# Patient Record
Sex: Female | Born: 1935 | Race: White | Hispanic: No | Smoking: Never smoker
Health system: Southern US, Community
[De-identification: ages and names within clinical notes are randomized; demographics above are authoritative.]

## PROBLEM LIST (undated history)

## (undated) DIAGNOSIS — I639 Cerebral infarction, unspecified: Secondary | ICD-10-CM

## (undated) DIAGNOSIS — R413 Other amnesia: Secondary | ICD-10-CM

## (undated) DIAGNOSIS — M797 Fibromyalgia: Secondary | ICD-10-CM

## (undated) DIAGNOSIS — R9389 Abnormal findings on diagnostic imaging of other specified body structures: Secondary | ICD-10-CM

## (undated) DIAGNOSIS — I10 Essential (primary) hypertension: Secondary | ICD-10-CM

## (undated) DIAGNOSIS — I340 Nonrheumatic mitral (valve) insufficiency: Secondary | ICD-10-CM

## (undated) DIAGNOSIS — I509 Heart failure, unspecified: Secondary | ICD-10-CM

## (undated) DIAGNOSIS — K219 Gastro-esophageal reflux disease without esophagitis: Secondary | ICD-10-CM

## (undated) DIAGNOSIS — I4891 Unspecified atrial fibrillation: Secondary | ICD-10-CM

## (undated) DIAGNOSIS — K5790 Diverticulosis of intestine, part unspecified, without perforation or abscess without bleeding: Secondary | ICD-10-CM

## (undated) DIAGNOSIS — K635 Polyp of colon: Secondary | ICD-10-CM

## (undated) DIAGNOSIS — K222 Esophageal obstruction: Secondary | ICD-10-CM

## (undated) DIAGNOSIS — G2581 Restless legs syndrome: Secondary | ICD-10-CM

## (undated) DIAGNOSIS — C439 Malignant melanoma of skin, unspecified: Secondary | ICD-10-CM

## (undated) DIAGNOSIS — K2210 Ulcer of esophagus without bleeding: Secondary | ICD-10-CM

## (undated) DIAGNOSIS — E785 Hyperlipidemia, unspecified: Secondary | ICD-10-CM

## (undated) HISTORY — DX: Restless legs syndrome: G25.81

## (undated) HISTORY — DX: Nonrheumatic mitral (valve) insufficiency: I34.0

## (undated) HISTORY — DX: Diverticulosis of intestine, part unspecified, without perforation or abscess without bleeding: K57.90

## (undated) HISTORY — DX: Esophageal obstruction: K22.2

## (undated) HISTORY — DX: Hyperlipidemia, unspecified: E78.5

## (undated) HISTORY — DX: Fibromyalgia: M79.7

## (undated) HISTORY — DX: Cerebral infarction, unspecified: I63.9

## (undated) HISTORY — DX: Malignant melanoma of skin, unspecified: C43.9

## (undated) HISTORY — DX: Other amnesia: R41.3

## (undated) HISTORY — DX: Abnormal findings on diagnostic imaging of other specified body structures: R93.89

## (undated) HISTORY — DX: Gastro-esophageal reflux disease without esophagitis: K21.9

## (undated) HISTORY — DX: Polyp of colon: K63.5

## (undated) HISTORY — PX: MOHS SURGERY: SUR867

## (undated) HISTORY — PX: APPENDECTOMY: SHX54

## (undated) HISTORY — DX: Unspecified atrial fibrillation: I48.91

## (undated) HISTORY — PX: CATARACT EXTRACTION: SUR2

## (undated) HISTORY — DX: Ulcer of esophagus without bleeding: K22.10

## (undated) HISTORY — DX: Heart failure, unspecified: I50.9

## (undated) HISTORY — DX: Essential (primary) hypertension: I10

---

## 1976-05-29 HISTORY — PX: RADICAL HYSTERECTOMY: SHX2283

## 1989-05-29 HISTORY — PX: CHOLECYSTECTOMY: SHX55

## 1998-09-06 ENCOUNTER — Encounter: Payer: Self-pay | Admitting: Gastroenterology

## 1998-09-06 ENCOUNTER — Ambulatory Visit (HOSPITAL_COMMUNITY): Admission: RE | Admit: 1998-09-06 | Discharge: 1998-09-06 | Payer: Self-pay | Admitting: Gastroenterology

## 1998-09-29 ENCOUNTER — Encounter: Payer: Self-pay | Admitting: Gastroenterology

## 1998-09-29 ENCOUNTER — Other Ambulatory Visit: Admission: RE | Admit: 1998-09-29 | Discharge: 1998-09-29 | Payer: Self-pay | Admitting: Gastroenterology

## 2001-09-26 ENCOUNTER — Encounter: Payer: Self-pay | Admitting: Gastroenterology

## 2002-04-10 ENCOUNTER — Encounter: Payer: Self-pay | Admitting: Gastroenterology

## 2002-08-27 ENCOUNTER — Encounter: Payer: Self-pay | Admitting: Gastroenterology

## 2002-08-28 ENCOUNTER — Encounter: Payer: Self-pay | Admitting: Gastroenterology

## 2002-08-28 ENCOUNTER — Ambulatory Visit (HOSPITAL_COMMUNITY): Admission: RE | Admit: 2002-08-28 | Discharge: 2002-08-28 | Payer: Self-pay | Admitting: Gastroenterology

## 2002-09-19 ENCOUNTER — Encounter: Admission: RE | Admit: 2002-09-19 | Discharge: 2002-09-19 | Payer: Self-pay | Admitting: Internal Medicine

## 2002-09-19 ENCOUNTER — Encounter: Payer: Self-pay | Admitting: Internal Medicine

## 2003-03-10 ENCOUNTER — Encounter: Payer: Self-pay | Admitting: Gastroenterology

## 2003-03-17 ENCOUNTER — Encounter: Payer: Self-pay | Admitting: Family Medicine

## 2003-03-17 ENCOUNTER — Inpatient Hospital Stay (HOSPITAL_COMMUNITY): Admission: EM | Admit: 2003-03-17 | Discharge: 2003-03-20 | Payer: Self-pay

## 2003-03-27 ENCOUNTER — Encounter: Payer: Self-pay | Admitting: Internal Medicine

## 2003-03-27 ENCOUNTER — Ambulatory Visit (HOSPITAL_COMMUNITY): Admission: RE | Admit: 2003-03-27 | Discharge: 2003-03-27 | Payer: Self-pay | Admitting: Internal Medicine

## 2003-03-29 ENCOUNTER — Emergency Department (HOSPITAL_COMMUNITY): Admission: EM | Admit: 2003-03-29 | Discharge: 2003-03-30 | Payer: Self-pay | Admitting: Emergency Medicine

## 2003-04-15 ENCOUNTER — Ambulatory Visit (HOSPITAL_COMMUNITY): Admission: RE | Admit: 2003-04-15 | Discharge: 2003-04-15 | Payer: Self-pay | Admitting: Gastroenterology

## 2003-04-15 ENCOUNTER — Encounter: Payer: Self-pay | Admitting: Gastroenterology

## 2004-04-05 ENCOUNTER — Ambulatory Visit: Payer: Self-pay | Admitting: Cardiology

## 2004-04-26 ENCOUNTER — Ambulatory Visit: Payer: Self-pay | Admitting: *Deleted

## 2004-04-27 ENCOUNTER — Ambulatory Visit: Payer: Self-pay | Admitting: Internal Medicine

## 2004-05-11 ENCOUNTER — Encounter: Payer: Self-pay | Admitting: Internal Medicine

## 2004-05-24 ENCOUNTER — Ambulatory Visit: Payer: Self-pay | Admitting: Cardiology

## 2004-06-01 ENCOUNTER — Ambulatory Visit: Payer: Self-pay | Admitting: Internal Medicine

## 2004-06-08 ENCOUNTER — Ambulatory Visit: Payer: Self-pay | Admitting: Internal Medicine

## 2004-06-21 ENCOUNTER — Ambulatory Visit: Payer: Self-pay | Admitting: Cardiology

## 2004-06-21 ENCOUNTER — Ambulatory Visit: Payer: Self-pay | Admitting: Internal Medicine

## 2004-07-04 ENCOUNTER — Ambulatory Visit: Payer: Self-pay | Admitting: *Deleted

## 2004-07-12 ENCOUNTER — Ambulatory Visit: Payer: Self-pay | Admitting: Internal Medicine

## 2004-07-15 ENCOUNTER — Ambulatory Visit: Payer: Self-pay | Admitting: Internal Medicine

## 2004-07-19 ENCOUNTER — Ambulatory Visit: Payer: Self-pay | Admitting: Cardiology

## 2004-07-27 ENCOUNTER — Ambulatory Visit: Payer: Self-pay

## 2004-07-29 ENCOUNTER — Ambulatory Visit: Payer: Self-pay | Admitting: Cardiology

## 2004-08-01 ENCOUNTER — Ambulatory Visit: Payer: Self-pay | Admitting: Cardiovascular Disease

## 2004-08-08 ENCOUNTER — Ambulatory Visit: Payer: Self-pay | Admitting: Cardiology

## 2004-08-12 ENCOUNTER — Ambulatory Visit: Payer: Self-pay | Admitting: Cardiology

## 2004-08-22 ENCOUNTER — Ambulatory Visit: Payer: Self-pay | Admitting: Cardiology

## 2004-09-14 ENCOUNTER — Ambulatory Visit: Payer: Self-pay | Admitting: Internal Medicine

## 2004-10-05 ENCOUNTER — Ambulatory Visit: Payer: Self-pay | Admitting: Internal Medicine

## 2004-10-11 ENCOUNTER — Ambulatory Visit: Payer: Self-pay | Admitting: Internal Medicine

## 2004-10-11 ENCOUNTER — Ambulatory Visit: Payer: Self-pay | Admitting: Cardiology

## 2004-10-18 ENCOUNTER — Ambulatory Visit: Payer: Self-pay | Admitting: Internal Medicine

## 2004-10-28 ENCOUNTER — Ambulatory Visit: Payer: Self-pay | Admitting: Internal Medicine

## 2004-10-28 ENCOUNTER — Ambulatory Visit: Payer: Self-pay | Admitting: Cardiology

## 2004-11-11 ENCOUNTER — Ambulatory Visit: Payer: Self-pay | Admitting: Internal Medicine

## 2004-12-07 ENCOUNTER — Ambulatory Visit: Payer: Self-pay | Admitting: Internal Medicine

## 2004-12-20 ENCOUNTER — Ambulatory Visit: Payer: Self-pay | Admitting: Internal Medicine

## 2004-12-27 ENCOUNTER — Ambulatory Visit: Payer: Self-pay | Admitting: Internal Medicine

## 2005-01-17 ENCOUNTER — Ambulatory Visit: Payer: Self-pay | Admitting: Internal Medicine

## 2005-02-07 ENCOUNTER — Ambulatory Visit: Payer: Self-pay | Admitting: Internal Medicine

## 2005-03-07 ENCOUNTER — Ambulatory Visit: Payer: Self-pay | Admitting: Internal Medicine

## 2005-03-17 ENCOUNTER — Ambulatory Visit: Payer: Self-pay | Admitting: Internal Medicine

## 2005-03-23 ENCOUNTER — Ambulatory Visit: Payer: Self-pay | Admitting: Internal Medicine

## 2005-04-03 ENCOUNTER — Ambulatory Visit: Payer: Self-pay | Admitting: Internal Medicine

## 2005-05-01 ENCOUNTER — Ambulatory Visit: Payer: Self-pay | Admitting: Internal Medicine

## 2005-06-06 ENCOUNTER — Ambulatory Visit: Payer: Self-pay | Admitting: Internal Medicine

## 2005-07-04 ENCOUNTER — Ambulatory Visit: Payer: Self-pay | Admitting: Internal Medicine

## 2005-07-24 ENCOUNTER — Ambulatory Visit: Payer: Self-pay | Admitting: Internal Medicine

## 2005-08-07 ENCOUNTER — Ambulatory Visit: Payer: Self-pay | Admitting: Internal Medicine

## 2005-08-15 ENCOUNTER — Ambulatory Visit: Payer: Self-pay | Admitting: Internal Medicine

## 2005-08-21 ENCOUNTER — Ambulatory Visit: Payer: Self-pay | Admitting: Internal Medicine

## 2005-08-29 ENCOUNTER — Ambulatory Visit: Payer: Self-pay | Admitting: Cardiology

## 2005-09-18 ENCOUNTER — Ambulatory Visit: Payer: Self-pay | Admitting: Internal Medicine

## 2005-09-27 ENCOUNTER — Ambulatory Visit: Payer: Self-pay | Admitting: Internal Medicine

## 2005-09-27 ENCOUNTER — Ambulatory Visit: Payer: Self-pay | Admitting: Cardiology

## 2005-10-13 ENCOUNTER — Ambulatory Visit: Payer: Self-pay | Admitting: Internal Medicine

## 2005-10-19 ENCOUNTER — Ambulatory Visit: Payer: Self-pay | Admitting: Internal Medicine

## 2005-10-26 ENCOUNTER — Ambulatory Visit: Payer: Self-pay | Admitting: Internal Medicine

## 2005-11-01 ENCOUNTER — Ambulatory Visit: Payer: Self-pay | Admitting: Internal Medicine

## 2005-11-08 ENCOUNTER — Ambulatory Visit: Payer: Self-pay | Admitting: Internal Medicine

## 2005-11-20 ENCOUNTER — Ambulatory Visit: Payer: Self-pay | Admitting: Internal Medicine

## 2005-12-08 ENCOUNTER — Ambulatory Visit: Payer: Self-pay | Admitting: Cardiology

## 2005-12-18 ENCOUNTER — Ambulatory Visit: Payer: Self-pay | Admitting: Internal Medicine

## 2006-01-01 ENCOUNTER — Ambulatory Visit: Payer: Self-pay | Admitting: Internal Medicine

## 2006-01-16 ENCOUNTER — Ambulatory Visit: Payer: Self-pay | Admitting: Internal Medicine

## 2006-02-13 ENCOUNTER — Ambulatory Visit: Payer: Self-pay | Admitting: Internal Medicine

## 2006-02-21 ENCOUNTER — Ambulatory Visit: Payer: Self-pay | Admitting: Internal Medicine

## 2006-03-06 ENCOUNTER — Ambulatory Visit: Payer: Self-pay | Admitting: Internal Medicine

## 2006-03-19 ENCOUNTER — Ambulatory Visit: Payer: Self-pay | Admitting: Internal Medicine

## 2006-03-19 LAB — CONVERTED CEMR LAB
ALT: 30 units/L (ref 0–40)
AST: 28 units/L (ref 0–37)
BUN: 12 mg/dL (ref 6–23)
Calcium: 9.9 mg/dL (ref 8.4–10.5)
Chloride: 105 meq/L (ref 96–112)
GFR calc non Af Amer: 65 mL/min
Glomerular Filtration Rate, Af Am: 78 mL/min/{1.73_m2}
Sed Rate: 4 mm/hr (ref 0–25)
Total CK: 53 units/L (ref 7–177)

## 2006-04-03 ENCOUNTER — Ambulatory Visit: Payer: Self-pay | Admitting: Internal Medicine

## 2006-04-16 ENCOUNTER — Ambulatory Visit: Payer: Self-pay | Admitting: Internal Medicine

## 2006-05-14 ENCOUNTER — Ambulatory Visit: Payer: Self-pay | Admitting: Internal Medicine

## 2006-06-02 DIAGNOSIS — Z8679 Personal history of other diseases of the circulatory system: Secondary | ICD-10-CM | POA: Insufficient documentation

## 2006-06-02 DIAGNOSIS — E785 Hyperlipidemia, unspecified: Secondary | ICD-10-CM

## 2006-06-02 DIAGNOSIS — I10 Essential (primary) hypertension: Secondary | ICD-10-CM | POA: Insufficient documentation

## 2006-06-05 ENCOUNTER — Ambulatory Visit: Payer: Self-pay | Admitting: Family Medicine

## 2006-06-06 ENCOUNTER — Ambulatory Visit: Payer: Self-pay | Admitting: Cardiology

## 2006-06-11 ENCOUNTER — Ambulatory Visit: Payer: Self-pay | Admitting: Cardiology

## 2006-06-13 ENCOUNTER — Ambulatory Visit: Payer: Self-pay | Admitting: Internal Medicine

## 2006-06-17 ENCOUNTER — Ambulatory Visit: Payer: Self-pay | Admitting: Cardiology

## 2006-06-17 ENCOUNTER — Inpatient Hospital Stay (HOSPITAL_COMMUNITY): Admission: EM | Admit: 2006-06-17 | Discharge: 2006-06-19 | Payer: Self-pay | Admitting: Emergency Medicine

## 2006-06-25 ENCOUNTER — Ambulatory Visit: Payer: Self-pay | Admitting: Internal Medicine

## 2006-07-03 ENCOUNTER — Ambulatory Visit: Payer: Self-pay | Admitting: Emergency Medicine

## 2006-07-10 ENCOUNTER — Ambulatory Visit: Payer: Self-pay | Admitting: Internal Medicine

## 2006-07-16 ENCOUNTER — Ambulatory Visit: Payer: Self-pay | Admitting: Cardiology

## 2006-07-16 ENCOUNTER — Ambulatory Visit: Payer: Self-pay | Admitting: Internal Medicine

## 2006-08-03 ENCOUNTER — Ambulatory Visit: Payer: Self-pay | Admitting: Internal Medicine

## 2006-08-14 ENCOUNTER — Ambulatory Visit: Payer: Self-pay | Admitting: Internal Medicine

## 2006-08-14 ENCOUNTER — Ambulatory Visit: Payer: Self-pay | Admitting: Emergency Medicine

## 2006-09-06 ENCOUNTER — Ambulatory Visit: Payer: Self-pay | Admitting: Family Medicine

## 2006-09-06 ENCOUNTER — Encounter: Payer: Self-pay | Admitting: Internal Medicine

## 2006-09-28 ENCOUNTER — Ambulatory Visit: Payer: Self-pay | Admitting: Internal Medicine

## 2006-10-05 ENCOUNTER — Ambulatory Visit: Payer: Self-pay | Admitting: Internal Medicine

## 2006-10-05 LAB — CONVERTED CEMR LAB
ALT: 21 units/L (ref 0–40)
HDL: 48.1 mg/dL (ref >39.0)
Total CHOL/HDL Ratio: 3.6
Triglycerides: 181 mg/dL — ABNORMAL HIGH (ref 0–149)
VLDL: 36 mg/dL (ref 0–40)

## 2006-10-16 ENCOUNTER — Ambulatory Visit: Payer: Self-pay | Admitting: Internal Medicine

## 2006-11-07 ENCOUNTER — Ambulatory Visit: Payer: Self-pay | Admitting: Internal Medicine

## 2006-11-12 ENCOUNTER — Telehealth (INDEPENDENT_AMBULATORY_CARE_PROVIDER_SITE_OTHER): Payer: Self-pay | Admitting: *Deleted

## 2006-12-03 ENCOUNTER — Ambulatory Visit: Payer: Self-pay | Admitting: Internal Medicine

## 2007-01-08 ENCOUNTER — Ambulatory Visit: Payer: Self-pay | Admitting: Cardiology

## 2007-01-15 ENCOUNTER — Ambulatory Visit: Payer: Self-pay | Admitting: Internal Medicine

## 2007-01-15 LAB — CONVERTED CEMR LAB
Bilirubin Urine: NEGATIVE
Glucose, Urine, Semiquant: NEGATIVE
Ketones, urine, test strip: NEGATIVE
Nitrite: NEGATIVE
Protein, U semiquant: NEGATIVE
Protein, ur: 30 mg/dL — AB
Urine Glucose: NEGATIVE mg/dL
Urobilinogen, UA: NEGATIVE
pH: 5
pH: 6.5 (ref 5.0–8.0)

## 2007-01-16 ENCOUNTER — Encounter: Payer: Self-pay | Admitting: Internal Medicine

## 2007-01-17 ENCOUNTER — Telehealth (INDEPENDENT_AMBULATORY_CARE_PROVIDER_SITE_OTHER): Payer: Self-pay | Admitting: *Deleted

## 2007-01-30 ENCOUNTER — Ambulatory Visit: Payer: Self-pay | Admitting: Internal Medicine

## 2007-01-30 DIAGNOSIS — G2581 Restless legs syndrome: Secondary | ICD-10-CM | POA: Insufficient documentation

## 2007-03-04 ENCOUNTER — Ambulatory Visit: Payer: Self-pay | Admitting: Internal Medicine

## 2007-03-04 ENCOUNTER — Ambulatory Visit: Payer: Self-pay | Admitting: Emergency Medicine

## 2007-03-04 LAB — CONVERTED CEMR LAB
CO2: 32 meq/L (ref 19–32)
Creatinine, Ser: 0.9 mg/dL (ref 0.4–1.2)
GFR calc Af Amer: 78 mL/min
Glucose, Bld: 91 mg/dL (ref 70–99)
Potassium: 4.6 meq/L (ref 3.5–5.1)
Sodium: 141 meq/L (ref 135–145)

## 2007-03-06 ENCOUNTER — Ambulatory Visit: Payer: Self-pay | Admitting: Internal Medicine

## 2007-03-12 ENCOUNTER — Ambulatory Visit: Payer: Self-pay | Admitting: Emergency Medicine

## 2007-03-16 DIAGNOSIS — K219 Gastro-esophageal reflux disease without esophagitis: Secondary | ICD-10-CM

## 2007-03-16 DIAGNOSIS — J309 Allergic rhinitis, unspecified: Secondary | ICD-10-CM | POA: Insufficient documentation

## 2007-04-01 ENCOUNTER — Encounter: Payer: Self-pay | Admitting: Internal Medicine

## 2007-04-17 ENCOUNTER — Telehealth (INDEPENDENT_AMBULATORY_CARE_PROVIDER_SITE_OTHER): Payer: Self-pay | Admitting: *Deleted

## 2007-04-22 ENCOUNTER — Telehealth: Payer: Self-pay | Admitting: Internal Medicine

## 2007-04-22 ENCOUNTER — Encounter: Payer: Self-pay | Admitting: Internal Medicine

## 2007-04-30 ENCOUNTER — Encounter: Payer: Self-pay | Admitting: Internal Medicine

## 2007-04-30 ENCOUNTER — Telehealth: Payer: Self-pay | Admitting: Internal Medicine

## 2007-04-30 LAB — CONVERTED CEMR LAB: INR: 2.3

## 2007-05-02 ENCOUNTER — Ambulatory Visit: Payer: Self-pay | Admitting: Internal Medicine

## 2007-05-02 ENCOUNTER — Telehealth: Payer: Self-pay | Admitting: Internal Medicine

## 2007-05-14 ENCOUNTER — Telehealth: Payer: Self-pay | Admitting: Internal Medicine

## 2007-05-14 ENCOUNTER — Encounter: Payer: Self-pay | Admitting: Internal Medicine

## 2007-05-14 LAB — CONVERTED CEMR LAB
INR: 2.3
Prothrombin Time: 23.4 s

## 2007-05-24 ENCOUNTER — Telehealth: Payer: Self-pay | Admitting: Internal Medicine

## 2007-06-11 ENCOUNTER — Telehealth (INDEPENDENT_AMBULATORY_CARE_PROVIDER_SITE_OTHER): Payer: Self-pay | Admitting: *Deleted

## 2007-06-11 ENCOUNTER — Encounter (INDEPENDENT_AMBULATORY_CARE_PROVIDER_SITE_OTHER): Payer: Self-pay | Admitting: *Deleted

## 2007-06-13 ENCOUNTER — Ambulatory Visit: Payer: Self-pay | Admitting: Internal Medicine

## 2007-06-13 ENCOUNTER — Telehealth: Payer: Self-pay | Admitting: Internal Medicine

## 2007-06-13 DIAGNOSIS — N318 Other neuromuscular dysfunction of bladder: Secondary | ICD-10-CM | POA: Insufficient documentation

## 2007-06-13 DIAGNOSIS — F0390 Unspecified dementia without behavioral disturbance: Secondary | ICD-10-CM

## 2007-06-13 LAB — CONVERTED CEMR LAB: INR: 1.8

## 2007-06-14 LAB — CONVERTED CEMR LAB
Basophils Absolute: 0 10*3/uL (ref 0.0–0.1)
Cholesterol: 169 mg/dL (ref 0–200)
Eosinophils Absolute: 0.1 10*3/uL (ref 0.0–0.6)
Eosinophils Relative: 1.3 % (ref 0.0–5.0)
HCT: 40.4 % (ref 36.0–46.0)
Hemoglobin: 13.8 g/dL (ref 12.0–15.0)
MCHC: 34.1 g/dL (ref 30.0–36.0)
MCV: 90.2 fL (ref 78.0–100.0)
Monocytes Absolute: 0.4 10*3/uL (ref 0.2–0.7)
Neutrophils Relative %: 65.1 % (ref 43.0–77.0)
RBC: 4.48 M/uL (ref 3.87–5.11)
RDW: 14.5 % (ref 11.5–14.6)
Total CHOL/HDL Ratio: 3.7
WBC: 7 10*3/uL (ref 4.5–10.5)

## 2007-06-24 ENCOUNTER — Telehealth (INDEPENDENT_AMBULATORY_CARE_PROVIDER_SITE_OTHER): Payer: Self-pay | Admitting: *Deleted

## 2007-06-26 ENCOUNTER — Telehealth (INDEPENDENT_AMBULATORY_CARE_PROVIDER_SITE_OTHER): Payer: Self-pay | Admitting: *Deleted

## 2007-06-27 ENCOUNTER — Telehealth: Payer: Self-pay | Admitting: Internal Medicine

## 2007-06-27 ENCOUNTER — Encounter: Payer: Self-pay | Admitting: Internal Medicine

## 2007-06-27 LAB — CONVERTED CEMR LAB
INR: 2.2
Prothrombin Time: 21.9 s

## 2007-07-08 ENCOUNTER — Ambulatory Visit: Payer: Self-pay | Admitting: Cardiology

## 2007-07-30 ENCOUNTER — Telehealth: Payer: Self-pay | Admitting: Internal Medicine

## 2007-07-30 ENCOUNTER — Encounter: Payer: Self-pay | Admitting: Internal Medicine

## 2007-07-30 LAB — CONVERTED CEMR LAB: INR: 2.2

## 2007-08-05 ENCOUNTER — Encounter: Payer: Self-pay | Admitting: Internal Medicine

## 2007-08-21 ENCOUNTER — Ambulatory Visit: Payer: Self-pay | Admitting: Internal Medicine

## 2007-08-21 LAB — CONVERTED CEMR LAB: INR: 3.2

## 2007-08-22 ENCOUNTER — Telehealth (INDEPENDENT_AMBULATORY_CARE_PROVIDER_SITE_OTHER): Payer: Self-pay | Admitting: *Deleted

## 2007-09-04 ENCOUNTER — Ambulatory Visit: Payer: Self-pay | Admitting: Internal Medicine

## 2007-09-04 LAB — CONVERTED CEMR LAB
Bacteria, UA: NONE SEEN
Bilirubin Urine: NEGATIVE
Glucose, Urine, Semiquant: NEGATIVE
Ketones, urine, test strip: NEGATIVE
Protein, U semiquant: NEGATIVE
RBC / HPF: NONE SEEN (ref <3)
Specific Gravity, Urine: 1.025
WBC, UA: NONE SEEN cells/hpf (ref <3)

## 2007-09-05 ENCOUNTER — Encounter: Payer: Self-pay | Admitting: Internal Medicine

## 2007-09-11 ENCOUNTER — Telehealth (INDEPENDENT_AMBULATORY_CARE_PROVIDER_SITE_OTHER): Payer: Self-pay | Admitting: *Deleted

## 2007-09-16 ENCOUNTER — Telehealth (INDEPENDENT_AMBULATORY_CARE_PROVIDER_SITE_OTHER): Payer: Self-pay | Admitting: *Deleted

## 2007-09-17 ENCOUNTER — Ambulatory Visit: Payer: Self-pay | Admitting: Internal Medicine

## 2007-09-25 ENCOUNTER — Telehealth (INDEPENDENT_AMBULATORY_CARE_PROVIDER_SITE_OTHER): Payer: Self-pay | Admitting: *Deleted

## 2007-09-26 ENCOUNTER — Telehealth (INDEPENDENT_AMBULATORY_CARE_PROVIDER_SITE_OTHER): Payer: Self-pay | Admitting: *Deleted

## 2007-09-27 ENCOUNTER — Ambulatory Visit: Payer: Self-pay | Admitting: Internal Medicine

## 2007-09-30 ENCOUNTER — Encounter: Payer: Self-pay | Admitting: Internal Medicine

## 2007-10-01 ENCOUNTER — Ambulatory Visit: Payer: Self-pay | Admitting: Internal Medicine

## 2007-10-03 ENCOUNTER — Telehealth: Payer: Self-pay | Admitting: Internal Medicine

## 2007-10-04 ENCOUNTER — Ambulatory Visit: Payer: Self-pay | Admitting: Internal Medicine

## 2007-10-08 ENCOUNTER — Encounter: Payer: Self-pay | Admitting: Internal Medicine

## 2007-10-10 ENCOUNTER — Ambulatory Visit: Payer: Self-pay | Admitting: Internal Medicine

## 2007-10-10 LAB — CONVERTED CEMR LAB
ALT: 18 units/L (ref 0–35)
BUN: 13 mg/dL (ref 6–23)
Calcium: 9.6 mg/dL (ref 8.4–10.5)
GFR calc Af Amer: 69 mL/min
Glucose, Bld: 97 mg/dL (ref 70–99)
INR: 1.9
Sodium: 145 meq/L (ref 135–145)

## 2007-10-14 ENCOUNTER — Encounter: Payer: Self-pay | Admitting: Internal Medicine

## 2007-10-14 ENCOUNTER — Encounter (INDEPENDENT_AMBULATORY_CARE_PROVIDER_SITE_OTHER): Payer: Self-pay | Admitting: *Deleted

## 2007-10-22 ENCOUNTER — Ambulatory Visit: Payer: Self-pay | Admitting: Internal Medicine

## 2007-10-22 ENCOUNTER — Encounter: Payer: Self-pay | Admitting: Internal Medicine

## 2007-10-28 ENCOUNTER — Ambulatory Visit: Payer: Self-pay | Admitting: Internal Medicine

## 2007-10-28 LAB — CONVERTED CEMR LAB
OCCULT 1: POSITIVE
OCCULT 2: POSITIVE

## 2007-10-30 ENCOUNTER — Telehealth (INDEPENDENT_AMBULATORY_CARE_PROVIDER_SITE_OTHER): Payer: Self-pay

## 2007-11-04 ENCOUNTER — Ambulatory Visit: Payer: Self-pay | Admitting: Internal Medicine

## 2007-11-04 ENCOUNTER — Encounter (INDEPENDENT_AMBULATORY_CARE_PROVIDER_SITE_OTHER): Payer: Self-pay | Admitting: *Deleted

## 2007-11-04 LAB — CONVERTED CEMR LAB
INR: 3.6
Prothrombin Time: 23.9 s

## 2007-11-14 ENCOUNTER — Telehealth (INDEPENDENT_AMBULATORY_CARE_PROVIDER_SITE_OTHER): Payer: Self-pay | Admitting: *Deleted

## 2007-11-15 ENCOUNTER — Encounter (INDEPENDENT_AMBULATORY_CARE_PROVIDER_SITE_OTHER): Payer: Self-pay | Admitting: *Deleted

## 2007-11-15 ENCOUNTER — Ambulatory Visit: Payer: Self-pay | Admitting: Gastroenterology

## 2007-11-15 DIAGNOSIS — C439 Malignant melanoma of skin, unspecified: Secondary | ICD-10-CM | POA: Insufficient documentation

## 2007-11-15 DIAGNOSIS — R195 Other fecal abnormalities: Secondary | ICD-10-CM

## 2007-11-15 DIAGNOSIS — Z860100 Personal history of colon polyps, unspecified: Secondary | ICD-10-CM | POA: Insufficient documentation

## 2007-11-15 DIAGNOSIS — Z85828 Personal history of other malignant neoplasm of skin: Secondary | ICD-10-CM

## 2007-11-18 ENCOUNTER — Ambulatory Visit: Payer: Self-pay | Admitting: Internal Medicine

## 2007-11-26 ENCOUNTER — Ambulatory Visit: Payer: Self-pay | Admitting: Cardiology

## 2007-11-26 ENCOUNTER — Ambulatory Visit: Payer: Self-pay | Admitting: Internal Medicine

## 2007-11-27 ENCOUNTER — Telehealth (INDEPENDENT_AMBULATORY_CARE_PROVIDER_SITE_OTHER): Payer: Self-pay | Admitting: *Deleted

## 2007-12-04 ENCOUNTER — Encounter: Payer: Self-pay | Admitting: Internal Medicine

## 2007-12-09 ENCOUNTER — Ambulatory Visit: Payer: Self-pay | Admitting: Cardiology

## 2007-12-16 ENCOUNTER — Ambulatory Visit: Payer: Self-pay | Admitting: Cardiology

## 2007-12-19 ENCOUNTER — Ambulatory Visit: Payer: Self-pay | Admitting: Gastroenterology

## 2007-12-19 LAB — HM COLONOSCOPY

## 2007-12-23 ENCOUNTER — Ambulatory Visit: Payer: Self-pay | Admitting: Cardiovascular Disease

## 2007-12-24 ENCOUNTER — Telehealth: Payer: Self-pay | Admitting: Gastroenterology

## 2007-12-26 ENCOUNTER — Ambulatory Visit: Payer: Self-pay | Admitting: Cardiology

## 2007-12-26 LAB — CONVERTED CEMR LAB: INR: 1.7 — ABNORMAL HIGH (ref 0.8–1.0)

## 2007-12-27 ENCOUNTER — Ambulatory Visit: Payer: Self-pay | Admitting: Cardiology

## 2007-12-27 LAB — CONVERTED CEMR LAB: Prothrombin Time: 19.8 s — ABNORMAL HIGH (ref 10.9–13.3)

## 2007-12-30 ENCOUNTER — Ambulatory Visit: Payer: Self-pay | Admitting: Cardiology

## 2007-12-30 LAB — CONVERTED CEMR LAB
INR: 2.4 — ABNORMAL HIGH (ref 0.8–1.0)
Prothrombin Time: 25.5 s — ABNORMAL HIGH (ref 10.9–13.3)

## 2008-01-13 ENCOUNTER — Ambulatory Visit: Payer: Self-pay | Admitting: Internal Medicine

## 2008-01-17 ENCOUNTER — Telehealth (INDEPENDENT_AMBULATORY_CARE_PROVIDER_SITE_OTHER): Payer: Self-pay | Admitting: *Deleted

## 2008-01-27 ENCOUNTER — Ambulatory Visit: Payer: Self-pay | Admitting: Internal Medicine

## 2008-01-27 LAB — CONVERTED CEMR LAB: INR: 1.5

## 2008-02-07 ENCOUNTER — Telehealth: Payer: Self-pay | Admitting: Gastroenterology

## 2008-02-11 ENCOUNTER — Ambulatory Visit: Payer: Self-pay | Admitting: Internal Medicine

## 2008-02-25 ENCOUNTER — Ambulatory Visit: Payer: Self-pay | Admitting: Internal Medicine

## 2008-03-09 ENCOUNTER — Ambulatory Visit: Payer: Self-pay | Admitting: Gastroenterology

## 2008-03-12 ENCOUNTER — Telehealth: Payer: Self-pay | Admitting: Gastroenterology

## 2008-03-19 ENCOUNTER — Ambulatory Visit: Payer: Self-pay | Admitting: Internal Medicine

## 2008-03-19 LAB — CONVERTED CEMR LAB: INR: 3.3

## 2008-03-20 ENCOUNTER — Telehealth: Payer: Self-pay | Admitting: Gastroenterology

## 2008-03-20 ENCOUNTER — Telehealth (INDEPENDENT_AMBULATORY_CARE_PROVIDER_SITE_OTHER): Payer: Self-pay | Admitting: *Deleted

## 2008-03-25 ENCOUNTER — Ambulatory Visit (HOSPITAL_COMMUNITY): Admission: RE | Admit: 2008-03-25 | Discharge: 2008-03-25 | Payer: Self-pay | Admitting: Neurology

## 2008-03-27 ENCOUNTER — Telehealth (INDEPENDENT_AMBULATORY_CARE_PROVIDER_SITE_OTHER): Payer: Self-pay | Admitting: *Deleted

## 2008-04-02 ENCOUNTER — Ambulatory Visit: Payer: Self-pay | Admitting: Internal Medicine

## 2008-04-02 LAB — CONVERTED CEMR LAB
INR: 2.1
Prothrombin Time: 17.8 s

## 2008-04-06 ENCOUNTER — Telehealth (INDEPENDENT_AMBULATORY_CARE_PROVIDER_SITE_OTHER): Payer: Self-pay | Admitting: *Deleted

## 2008-04-06 LAB — CONVERTED CEMR LAB
BUN: 16 mg/dL (ref 6–23)
Basophils Relative: 0.9 % (ref 0.0–3.0)
CO2: 32 meq/L (ref 19–32)
Chloride: 103 meq/L (ref 96–112)
Creatinine, Ser: 0.8 mg/dL (ref 0.4–1.2)
Eosinophils Relative: 2.9 % (ref 0.0–5.0)
Glucose, Bld: 89 mg/dL (ref 70–99)
Lymphocytes Relative: 29.1 % (ref 12.0–46.0)
MCV: 90.8 fL (ref 78.0–100.0)
Monocytes Relative: 7.5 % (ref 3.0–12.0)
Neutrophils Relative %: 59.6 % (ref 43.0–77.0)
RBC: 4.4 M/uL (ref 3.87–5.11)
Saturation Ratios: 15.4 % — ABNORMAL LOW (ref 20.0–50.0)
WBC: 7.3 10*3/uL (ref 4.5–10.5)

## 2008-04-07 ENCOUNTER — Encounter (INDEPENDENT_AMBULATORY_CARE_PROVIDER_SITE_OTHER): Payer: Self-pay | Admitting: *Deleted

## 2008-04-07 ENCOUNTER — Encounter: Payer: Self-pay | Admitting: Internal Medicine

## 2008-04-30 ENCOUNTER — Ambulatory Visit: Payer: Self-pay | Admitting: Internal Medicine

## 2008-04-30 ENCOUNTER — Telehealth (INDEPENDENT_AMBULATORY_CARE_PROVIDER_SITE_OTHER): Payer: Self-pay | Admitting: *Deleted

## 2008-04-30 LAB — CONVERTED CEMR LAB
INR: 1.7
Prothrombin Time: 15.9 s

## 2008-05-07 ENCOUNTER — Telehealth: Payer: Self-pay | Admitting: Gastroenterology

## 2008-05-12 ENCOUNTER — Ambulatory Visit: Payer: Self-pay | Admitting: Internal Medicine

## 2008-05-12 LAB — CONVERTED CEMR LAB: Prothrombin Time: 15.7 s

## 2008-05-25 ENCOUNTER — Ambulatory Visit: Payer: Self-pay | Admitting: Internal Medicine

## 2008-05-25 LAB — CONVERTED CEMR LAB: INR: 1.8

## 2008-05-26 ENCOUNTER — Telehealth (INDEPENDENT_AMBULATORY_CARE_PROVIDER_SITE_OTHER): Payer: Self-pay | Admitting: *Deleted

## 2008-06-11 ENCOUNTER — Ambulatory Visit: Payer: Self-pay | Admitting: Internal Medicine

## 2008-06-18 ENCOUNTER — Ambulatory Visit: Payer: Self-pay | Admitting: Cardiology

## 2008-07-02 ENCOUNTER — Ambulatory Visit: Payer: Self-pay | Admitting: Internal Medicine

## 2008-07-02 LAB — CONVERTED CEMR LAB: INR: 2.2

## 2008-07-27 ENCOUNTER — Ambulatory Visit: Payer: Self-pay | Admitting: Internal Medicine

## 2008-07-27 LAB — CONVERTED CEMR LAB: INR: 3.5

## 2008-08-04 ENCOUNTER — Telehealth (INDEPENDENT_AMBULATORY_CARE_PROVIDER_SITE_OTHER): Payer: Self-pay | Admitting: *Deleted

## 2008-08-05 ENCOUNTER — Ambulatory Visit: Payer: Self-pay | Admitting: Internal Medicine

## 2008-08-10 ENCOUNTER — Ambulatory Visit: Payer: Self-pay | Admitting: Internal Medicine

## 2008-08-10 DIAGNOSIS — M549 Dorsalgia, unspecified: Secondary | ICD-10-CM | POA: Insufficient documentation

## 2008-08-10 LAB — CONVERTED CEMR LAB
Nitrite: NEGATIVE
Protein, U semiquant: NEGATIVE

## 2008-08-11 ENCOUNTER — Encounter: Payer: Self-pay | Admitting: Internal Medicine

## 2008-08-11 LAB — CONVERTED CEMR LAB

## 2008-08-13 ENCOUNTER — Encounter: Payer: Self-pay | Admitting: Internal Medicine

## 2008-08-19 ENCOUNTER — Ambulatory Visit: Payer: Self-pay | Admitting: Internal Medicine

## 2008-08-19 LAB — CONVERTED CEMR LAB: INR: 2.2

## 2008-09-04 ENCOUNTER — Telehealth (INDEPENDENT_AMBULATORY_CARE_PROVIDER_SITE_OTHER): Payer: Self-pay | Admitting: *Deleted

## 2008-09-21 ENCOUNTER — Ambulatory Visit: Payer: Self-pay | Admitting: Internal Medicine

## 2008-09-21 LAB — CONVERTED CEMR LAB
INR: 2.7
Prothrombin Time: 20 s

## 2008-09-22 ENCOUNTER — Telehealth (INDEPENDENT_AMBULATORY_CARE_PROVIDER_SITE_OTHER): Payer: Self-pay | Admitting: *Deleted

## 2008-09-23 ENCOUNTER — Telehealth (INDEPENDENT_AMBULATORY_CARE_PROVIDER_SITE_OTHER): Payer: Self-pay | Admitting: *Deleted

## 2008-10-19 ENCOUNTER — Ambulatory Visit: Payer: Self-pay | Admitting: Internal Medicine

## 2008-11-03 ENCOUNTER — Ambulatory Visit: Payer: Self-pay | Admitting: Internal Medicine

## 2008-11-03 DIAGNOSIS — N390 Urinary tract infection, site not specified: Secondary | ICD-10-CM | POA: Insufficient documentation

## 2008-11-03 LAB — CONVERTED CEMR LAB
Bilirubin Urine: NEGATIVE
Ketones, urine, test strip: NEGATIVE
Nitrite: NEGATIVE
Prothrombin Time: 25.9 s
RBC / HPF: NONE SEEN (ref <3)
WBC, UA: NONE SEEN cells/hpf (ref <3)

## 2008-11-04 ENCOUNTER — Encounter: Payer: Self-pay | Admitting: Internal Medicine

## 2008-11-16 ENCOUNTER — Ambulatory Visit: Payer: Self-pay | Admitting: Internal Medicine

## 2008-11-17 ENCOUNTER — Telehealth (INDEPENDENT_AMBULATORY_CARE_PROVIDER_SITE_OTHER): Payer: Self-pay | Admitting: *Deleted

## 2008-12-02 ENCOUNTER — Ambulatory Visit: Payer: Self-pay | Admitting: Internal Medicine

## 2008-12-02 LAB — CONVERTED CEMR LAB: INR: 2.4

## 2008-12-03 ENCOUNTER — Telehealth (INDEPENDENT_AMBULATORY_CARE_PROVIDER_SITE_OTHER): Payer: Self-pay | Admitting: *Deleted

## 2008-12-21 ENCOUNTER — Ambulatory Visit: Payer: Self-pay | Admitting: Internal Medicine

## 2008-12-21 LAB — CONVERTED CEMR LAB
Bilirubin Urine: NEGATIVE
Glucose, Urine, Semiquant: NEGATIVE
Ketones, urine, test strip: NEGATIVE
Protein, U semiquant: NEGATIVE
Vit D, 25-Hydroxy: 40 ng/mL (ref 30–89)

## 2008-12-22 ENCOUNTER — Encounter (INDEPENDENT_AMBULATORY_CARE_PROVIDER_SITE_OTHER): Payer: Self-pay | Admitting: *Deleted

## 2008-12-25 ENCOUNTER — Telehealth (INDEPENDENT_AMBULATORY_CARE_PROVIDER_SITE_OTHER): Payer: Self-pay | Admitting: *Deleted

## 2008-12-29 ENCOUNTER — Telehealth (INDEPENDENT_AMBULATORY_CARE_PROVIDER_SITE_OTHER): Payer: Self-pay | Admitting: *Deleted

## 2008-12-29 LAB — CONVERTED CEMR LAB
ALT: 22 units/L (ref 0–35)
AST: 21 units/L (ref 0–37)
CO2: 30 meq/L (ref 19–32)
Chloride: 105 meq/L (ref 96–112)
Cholesterol: 186 mg/dL (ref 0–200)
HDL: 47.2 mg/dL (ref >39.00)
Potassium: 4.6 meq/L (ref 3.5–5.1)
Sodium: 142 meq/L (ref 135–145)
TSH: 0.82 microintl units/mL (ref 0.35–5.50)
Total CHOL/HDL Ratio: 4
Triglycerides: 220 mg/dL — ABNORMAL HIGH (ref 0.0–149.0)
VLDL: 44 mg/dL — ABNORMAL HIGH (ref 0.0–40.0)

## 2008-12-31 ENCOUNTER — Ambulatory Visit: Payer: Self-pay | Admitting: Internal Medicine

## 2008-12-31 LAB — CONVERTED CEMR LAB
Bilirubin Urine: NEGATIVE
Blood in Urine, dipstick: NEGATIVE
Glucose, Urine, Semiquant: NEGATIVE
Protein, U semiquant: NEGATIVE

## 2009-01-01 ENCOUNTER — Encounter: Payer: Self-pay | Admitting: Internal Medicine

## 2009-01-01 LAB — CONVERTED CEMR LAB

## 2009-01-07 ENCOUNTER — Telehealth: Payer: Self-pay | Admitting: Internal Medicine

## 2009-01-18 ENCOUNTER — Telehealth: Payer: Self-pay | Admitting: Internal Medicine

## 2009-01-19 ENCOUNTER — Ambulatory Visit: Payer: Self-pay | Admitting: Internal Medicine

## 2009-01-19 LAB — CONVERTED CEMR LAB: Prothrombin Time: 15.9 s

## 2009-01-30 ENCOUNTER — Telehealth: Payer: Self-pay | Admitting: Family Medicine

## 2009-02-02 ENCOUNTER — Ambulatory Visit: Payer: Self-pay | Admitting: Family Medicine

## 2009-02-02 LAB — CONVERTED CEMR LAB: Prothrombin Time: 18.1 s

## 2009-02-03 ENCOUNTER — Telehealth (INDEPENDENT_AMBULATORY_CARE_PROVIDER_SITE_OTHER): Payer: Self-pay | Admitting: *Deleted

## 2009-02-03 ENCOUNTER — Encounter: Payer: Self-pay | Admitting: Internal Medicine

## 2009-02-05 LAB — CONVERTED CEMR LAB

## 2009-02-16 ENCOUNTER — Ambulatory Visit: Payer: Self-pay | Admitting: Emergency Medicine

## 2009-02-24 ENCOUNTER — Telehealth (INDEPENDENT_AMBULATORY_CARE_PROVIDER_SITE_OTHER): Payer: Self-pay | Admitting: *Deleted

## 2009-02-25 ENCOUNTER — Encounter: Payer: Self-pay | Admitting: Internal Medicine

## 2009-02-25 ENCOUNTER — Ambulatory Visit: Payer: Self-pay | Admitting: Internal Medicine

## 2009-03-01 ENCOUNTER — Ambulatory Visit: Payer: Self-pay | Admitting: Internal Medicine

## 2009-03-01 ENCOUNTER — Ambulatory Visit: Payer: Self-pay | Admitting: Emergency Medicine

## 2009-03-09 ENCOUNTER — Encounter: Payer: Self-pay | Admitting: Internal Medicine

## 2009-03-16 ENCOUNTER — Telehealth (INDEPENDENT_AMBULATORY_CARE_PROVIDER_SITE_OTHER): Payer: Self-pay | Admitting: *Deleted

## 2009-03-22 ENCOUNTER — Ambulatory Visit: Payer: Self-pay | Admitting: Internal Medicine

## 2009-04-02 ENCOUNTER — Ambulatory Visit: Payer: Self-pay | Admitting: Internal Medicine

## 2009-04-02 LAB — CONVERTED CEMR LAB: INR: 1.8

## 2009-04-19 ENCOUNTER — Ambulatory Visit: Payer: Self-pay | Admitting: Internal Medicine

## 2009-04-19 LAB — CONVERTED CEMR LAB: Prothrombin Time: 20.2 s

## 2009-05-06 ENCOUNTER — Encounter (INDEPENDENT_AMBULATORY_CARE_PROVIDER_SITE_OTHER): Payer: Self-pay | Admitting: *Deleted

## 2009-05-06 ENCOUNTER — Ambulatory Visit: Payer: Self-pay | Admitting: Family Medicine

## 2009-05-06 DIAGNOSIS — K589 Irritable bowel syndrome without diarrhea: Secondary | ICD-10-CM

## 2009-05-06 LAB — CONVERTED CEMR LAB
Bilirubin Urine: NEGATIVE
Blood in Urine, dipstick: NEGATIVE
Glucose, Urine, Semiquant: NEGATIVE
Ketones, urine, test strip: NEGATIVE
Protein, U semiquant: NEGATIVE
Specific Gravity, Urine: 1.015
WBC Urine, dipstick: NEGATIVE
pH: 6.5

## 2009-05-10 ENCOUNTER — Encounter (INDEPENDENT_AMBULATORY_CARE_PROVIDER_SITE_OTHER): Payer: Self-pay | Admitting: *Deleted

## 2009-05-18 ENCOUNTER — Ambulatory Visit: Payer: Self-pay | Admitting: Internal Medicine

## 2009-05-18 LAB — CONVERTED CEMR LAB
INR: 1.6
Prothrombin Time: 15.5 s

## 2009-06-01 ENCOUNTER — Ambulatory Visit: Payer: Self-pay | Admitting: Internal Medicine

## 2009-06-01 LAB — CONVERTED CEMR LAB
INR: 1.6
Prothrombin Time: 15.6 s

## 2009-06-03 ENCOUNTER — Encounter: Payer: Self-pay | Admitting: Cardiology

## 2009-06-04 ENCOUNTER — Ambulatory Visit: Payer: Self-pay | Admitting: Cardiology

## 2009-06-16 ENCOUNTER — Ambulatory Visit: Payer: Self-pay | Admitting: Internal Medicine

## 2009-06-28 ENCOUNTER — Ambulatory Visit: Payer: Self-pay | Admitting: Gastroenterology

## 2009-06-28 DIAGNOSIS — K5730 Diverticulosis of large intestine without perforation or abscess without bleeding: Secondary | ICD-10-CM | POA: Insufficient documentation

## 2009-06-28 DIAGNOSIS — R109 Unspecified abdominal pain: Secondary | ICD-10-CM

## 2009-06-28 LAB — CONVERTED CEMR LAB
Albumin: 4.1 g/dL (ref 3.5–5.2)
Alkaline Phosphatase: 60 units/L (ref 39–117)
BUN: 14 mg/dL (ref 6–23)
Basophils Absolute: 0 10*3/uL (ref 0.0–0.1)
Basophils Relative: 0.6 % (ref 0.0–3.0)
CO2: 30 meq/L (ref 19–32)
Calcium: 9.7 mg/dL (ref 8.4–10.5)
Creatinine, Ser: 0.9 mg/dL (ref 0.4–1.2)
Eosinophils Absolute: 0.1 10*3/uL (ref 0.0–0.7)
GFR calc non Af Amer: 63.84 mL/min (ref >60)
Glucose, Bld: 101 mg/dL — ABNORMAL HIGH (ref 70–99)
Hemoglobin: 13.6 g/dL (ref 12.0–15.0)
Lymphocytes Relative: 30 % (ref 12.0–46.0)
MCHC: 32.4 g/dL (ref 30.0–36.0)
Monocytes Relative: 7.9 % (ref 3.0–12.0)
Neutro Abs: 3.9 10*3/uL (ref 1.4–7.7)
Neutrophils Relative %: 60.2 % (ref 43.0–77.0)
RBC: 4.54 M/uL (ref 3.87–5.11)
RDW: 14.9 % — ABNORMAL HIGH (ref 11.5–14.6)
TSH: 0.94 microintl units/mL (ref 0.35–5.50)
Total Protein: 7.1 g/dL (ref 6.0–8.3)

## 2009-06-29 ENCOUNTER — Telehealth (INDEPENDENT_AMBULATORY_CARE_PROVIDER_SITE_OTHER): Payer: Self-pay | Admitting: *Deleted

## 2009-06-29 ENCOUNTER — Ambulatory Visit: Payer: Self-pay | Admitting: Internal Medicine

## 2009-06-30 ENCOUNTER — Telehealth (INDEPENDENT_AMBULATORY_CARE_PROVIDER_SITE_OTHER): Payer: Self-pay | Admitting: *Deleted

## 2009-07-01 ENCOUNTER — Ambulatory Visit: Payer: Self-pay | Admitting: Internal Medicine

## 2009-07-01 LAB — CONVERTED CEMR LAB: INR: 3.4 — ABNORMAL HIGH (ref 0.8–1.0)

## 2009-07-06 ENCOUNTER — Ambulatory Visit: Payer: Self-pay | Admitting: Internal Medicine

## 2009-07-09 ENCOUNTER — Telehealth (INDEPENDENT_AMBULATORY_CARE_PROVIDER_SITE_OTHER): Payer: Self-pay | Admitting: *Deleted

## 2009-07-16 ENCOUNTER — Ambulatory Visit: Payer: Self-pay | Admitting: Internal Medicine

## 2009-07-16 DIAGNOSIS — R1032 Left lower quadrant pain: Secondary | ICD-10-CM

## 2009-07-16 LAB — CONVERTED CEMR LAB
Bilirubin Urine: NEGATIVE
Glucose, Urine, Semiquant: NEGATIVE
Hemoglobin: 12.6 g/dL
INR: 4.1 — ABNORMAL HIGH (ref 0.8–1.0)
Prothrombin Time: 41.9 s — ABNORMAL HIGH (ref 9.1–11.7)
Specific Gravity, Urine: <1.005

## 2009-07-17 ENCOUNTER — Encounter: Payer: Self-pay | Admitting: Internal Medicine

## 2009-07-17 LAB — CONVERTED CEMR LAB
Bacteria, UA: NONE SEEN
RBC / HPF: NONE SEEN (ref <3)
WBC, UA: NONE SEEN cells/hpf (ref <3)

## 2009-07-20 ENCOUNTER — Telehealth (INDEPENDENT_AMBULATORY_CARE_PROVIDER_SITE_OTHER): Payer: Self-pay | Admitting: *Deleted

## 2009-07-21 LAB — CONVERTED CEMR LAB
Basophils Relative: 0.2 % (ref 0.0–3.0)
Lymphocytes Relative: 19.3 % (ref 12.0–46.0)
Lymphs Abs: 1.6 10*3/uL (ref 0.7–4.0)
MCHC: 33.1 g/dL (ref 30.0–36.0)
Monocytes Absolute: 0.6 10*3/uL (ref 0.1–1.0)
Monocytes Relative: 7.1 % (ref 3.0–12.0)
Neutro Abs: 6.2 10*3/uL (ref 1.4–7.7)
Neutrophils Relative %: 72.5 % (ref 43.0–77.0)
Platelets: 207 10*3/uL (ref 150.0–400.0)
RDW: 15.4 % — ABNORMAL HIGH (ref 11.5–14.6)

## 2009-08-02 ENCOUNTER — Ambulatory Visit: Payer: Self-pay | Admitting: Internal Medicine

## 2009-08-17 ENCOUNTER — Ambulatory Visit: Payer: Self-pay | Admitting: Internal Medicine

## 2009-08-17 LAB — CONVERTED CEMR LAB: INR: 3.1

## 2009-09-07 ENCOUNTER — Ambulatory Visit: Payer: Self-pay | Admitting: Internal Medicine

## 2009-09-07 LAB — CONVERTED CEMR LAB: Prothrombin Time: 2.4 s

## 2009-09-24 ENCOUNTER — Encounter: Payer: Self-pay | Admitting: Internal Medicine

## 2009-09-28 ENCOUNTER — Ambulatory Visit: Payer: Self-pay | Admitting: Internal Medicine

## 2009-10-06 ENCOUNTER — Encounter: Payer: Self-pay | Admitting: Internal Medicine

## 2009-10-15 ENCOUNTER — Ambulatory Visit: Payer: Self-pay | Admitting: Internal Medicine

## 2009-10-29 ENCOUNTER — Ambulatory Visit: Payer: Self-pay | Admitting: Internal Medicine

## 2009-12-01 ENCOUNTER — Ambulatory Visit: Payer: Self-pay | Admitting: Internal Medicine

## 2009-12-31 ENCOUNTER — Ambulatory Visit: Payer: Self-pay | Admitting: Internal Medicine

## 2010-01-17 ENCOUNTER — Ambulatory Visit: Payer: Self-pay | Admitting: Internal Medicine

## 2010-01-17 LAB — CONVERTED CEMR LAB
Glucose, Urine, Semiquant: NEGATIVE
Ketones, urine, test strip: NEGATIVE
Protein, U semiquant: NEGATIVE
Urobilinogen, UA: 0.2
pH: 6

## 2010-01-18 ENCOUNTER — Ambulatory Visit: Payer: Self-pay | Admitting: Internal Medicine

## 2010-01-18 ENCOUNTER — Telehealth: Payer: Self-pay | Admitting: Internal Medicine

## 2010-01-18 LAB — CONVERTED CEMR LAB
Casts: NONE SEEN /lpf
Crystals: NONE SEEN
RBC / HPF: NONE SEEN (ref <3)

## 2010-01-19 ENCOUNTER — Encounter: Payer: Self-pay | Admitting: Internal Medicine

## 2010-01-26 ENCOUNTER — Telehealth (INDEPENDENT_AMBULATORY_CARE_PROVIDER_SITE_OTHER): Payer: Self-pay | Admitting: *Deleted

## 2010-02-01 ENCOUNTER — Ambulatory Visit: Payer: Self-pay | Admitting: Internal Medicine

## 2010-02-01 LAB — CONVERTED CEMR LAB
INR: 2.3
Nitrite: NEGATIVE
Protein, U semiquant: NEGATIVE
Urobilinogen, UA: 0.2
WBC Urine, dipstick: NEGATIVE

## 2010-02-07 LAB — CONVERTED CEMR LAB
AST: 22 units/L (ref 0–37)
BUN: 14 mg/dL (ref 6–23)
CO2: 29 meq/L (ref 19–32)
Chloride: 105 meq/L (ref 96–112)
Creatinine, Ser: 0.9 mg/dL (ref 0.4–1.2)
Direct LDL: 115.8 mg/dL
Glucose, Bld: 85 mg/dL (ref 70–99)
HDL: 49 mg/dL (ref >39.00)
Total CHOL/HDL Ratio: 4
VLDL: 44.2 mg/dL — ABNORMAL HIGH (ref 0.0–40.0)

## 2010-02-09 ENCOUNTER — Telehealth: Payer: Self-pay | Admitting: Internal Medicine

## 2010-02-10 ENCOUNTER — Encounter: Payer: Self-pay | Admitting: Internal Medicine

## 2010-02-21 ENCOUNTER — Telehealth (INDEPENDENT_AMBULATORY_CARE_PROVIDER_SITE_OTHER): Payer: Self-pay | Admitting: *Deleted

## 2010-02-22 ENCOUNTER — Telehealth (INDEPENDENT_AMBULATORY_CARE_PROVIDER_SITE_OTHER): Payer: Self-pay | Admitting: *Deleted

## 2010-02-28 ENCOUNTER — Ambulatory Visit: Payer: Self-pay | Admitting: Internal Medicine

## 2010-03-30 ENCOUNTER — Ambulatory Visit: Payer: Self-pay | Admitting: Internal Medicine

## 2010-03-30 ENCOUNTER — Telehealth: Payer: Self-pay | Admitting: Internal Medicine

## 2010-04-08 ENCOUNTER — Telehealth (INDEPENDENT_AMBULATORY_CARE_PROVIDER_SITE_OTHER): Payer: Self-pay | Admitting: *Deleted

## 2010-04-29 ENCOUNTER — Ambulatory Visit: Payer: Self-pay | Admitting: Internal Medicine

## 2010-05-02 ENCOUNTER — Telehealth (INDEPENDENT_AMBULATORY_CARE_PROVIDER_SITE_OTHER): Payer: Self-pay | Admitting: *Deleted

## 2010-05-09 ENCOUNTER — Telehealth (INDEPENDENT_AMBULATORY_CARE_PROVIDER_SITE_OTHER): Payer: Self-pay | Admitting: *Deleted

## 2010-05-16 ENCOUNTER — Telehealth (INDEPENDENT_AMBULATORY_CARE_PROVIDER_SITE_OTHER): Payer: Self-pay | Admitting: *Deleted

## 2010-06-01 ENCOUNTER — Ambulatory Visit
Admission: RE | Admit: 2010-06-01 | Discharge: 2010-06-01 | Payer: Self-pay | Source: Home / Self Care | Attending: Internal Medicine | Admitting: Internal Medicine

## 2010-06-01 LAB — CONVERTED CEMR LAB: INR: 3.1

## 2010-06-02 ENCOUNTER — Ambulatory Visit
Admission: RE | Admit: 2010-06-02 | Discharge: 2010-06-02 | Payer: Self-pay | Source: Home / Self Care | Attending: Internal Medicine | Admitting: Internal Medicine

## 2010-06-02 DIAGNOSIS — R42 Dizziness and giddiness: Secondary | ICD-10-CM | POA: Insufficient documentation

## 2010-06-02 LAB — CONVERTED CEMR LAB: Hemoglobin: 12.7 g/dL

## 2010-06-07 ENCOUNTER — Telehealth: Payer: Self-pay | Admitting: Internal Medicine

## 2010-06-07 ENCOUNTER — Encounter: Payer: Self-pay | Admitting: Internal Medicine

## 2010-06-08 ENCOUNTER — Ambulatory Visit: Admission: RE | Admit: 2010-06-08 | Discharge: 2010-06-08 | Payer: Self-pay | Source: Home / Self Care

## 2010-06-08 ENCOUNTER — Encounter: Payer: Self-pay | Admitting: Internal Medicine

## 2010-06-08 ENCOUNTER — Ambulatory Visit (HOSPITAL_COMMUNITY)
Admission: RE | Admit: 2010-06-08 | Discharge: 2010-06-08 | Payer: Self-pay | Source: Home / Self Care | Attending: Internal Medicine | Admitting: Internal Medicine

## 2010-06-09 ENCOUNTER — Telehealth: Payer: Self-pay | Admitting: Internal Medicine

## 2010-06-13 ENCOUNTER — Telehealth: Payer: Self-pay | Admitting: Internal Medicine

## 2010-06-14 ENCOUNTER — Ambulatory Visit
Admission: RE | Admit: 2010-06-14 | Discharge: 2010-06-14 | Payer: Self-pay | Source: Home / Self Care | Attending: Cardiology | Admitting: Cardiology

## 2010-06-15 ENCOUNTER — Ambulatory Visit: Admit: 2010-06-15 | Payer: Self-pay | Admitting: Internal Medicine

## 2010-06-15 ENCOUNTER — Encounter: Payer: Self-pay | Admitting: Cardiology

## 2010-06-16 ENCOUNTER — Encounter
Admission: RE | Admit: 2010-06-16 | Discharge: 2010-06-16 | Payer: Self-pay | Source: Home / Self Care | Attending: Internal Medicine | Admitting: Internal Medicine

## 2010-06-17 ENCOUNTER — Telehealth (INDEPENDENT_AMBULATORY_CARE_PROVIDER_SITE_OTHER): Payer: Self-pay | Admitting: *Deleted

## 2010-06-18 ENCOUNTER — Encounter: Payer: Self-pay | Admitting: Gastroenterology

## 2010-06-20 ENCOUNTER — Encounter: Payer: Self-pay | Admitting: Gastroenterology

## 2010-06-28 NOTE — Procedures (Signed)
Summary: [employer]   Imported By: Sherian Rein 06/29/2009 10:05:43  _____________________________________________________________________  External Attachment:    Type:   Image     Comment:   External Document

## 2010-06-28 NOTE — Progress Notes (Signed)
Summary: can't  take Lipitor  Phone Note Call from Patient Call back at Home Phone [phone] Call back at [phone] [place] Price   Caller: Patient Summary of Call: cholesterol med was changed to lipitor - patient took this a long time ago & it upset her stomache - she wants to know if she can take something else or stay with simvastatin- [facility] Initial call taken by: Okey Regal Spring,  February 09, 2010 10:01 AM  Follow-up for Phone Call        we can try Crestor 20 mg one by mouth daily Follow-up by: Marquan Vokes E. Temitope Flammer MD,  February 09, 2010 12:19 PM  Additional Follow-up for Phone Call Additional follow up Details #1::        Left message for pt to call back. Army Fossa CMA  February 09, 2010 1:16 PM     Additional Follow-up for Phone Call Additional follow up Details #2::    Spoke with  Mrs. Scarlata and [name] and they asked if she could be switched to something with a generic bc she remembers the Crestor being expensive.  Follow-up by: Harold Barban,  February 10, 2010 9:06 AM  Additional Follow-up for Phone Call Additional follow up Details #3:: Details for Additional Follow-up Action Taken: I doubt Pravachol will help better than simvastatin Plan: Stay on simvastatin Olivia Pavelko E. Manha Amato MD  February 10, 2010 12:54 PM   Spoke with pt she is aware. Army Fossa CMA  February 11, 2010 9:41 AM   New/Updated Medications: SIMVASTATIN 40 MG TABS (SIMVASTATIN) 1 by mouth daily at bedtime. Prescriptions: SIMVASTATIN 40 MG TABS (SIMVASTATIN) 1 by mouth daily at bedtime.  #30 x 1   Entered by:   Army Fossa CMA   Authorized by:   Nolon Rod. Zephaniah Enyeart MD   Signed by:   Army Fossa CMA on 02/11/2010   Method used:   [facility] [id number]       [address]       [place], [place]  [address]       Ph: [phone] or [phone]       Fax: [phone]   RxID:   [id number]

## 2010-06-28 NOTE — Assessment & Plan Note (Signed)
Summary: PT-CHECK--PH--PT ARRIVE AT 12 NOON   Nurse Visit   Vital Signs:  Patient profile:   77 year old female Height:      67 inches Weight:      151 pounds BP sitting:   110 / 60  Vitals Entered By: Shary Decamp (Sep 28, 2009 11:09 AM)  Allergies: 1)  ! Penicillin 2)  ! Sulfa 3)  ! Septra 4)  ! Flagyl 5)  ! Vicodin 6)  ! Tramadol Hcl 7)  Prednisone Laboratory Results   Blood Tests      INR: 3.3   (Normal Range: 0.88-1.12   Therap INR: 2.0-3.5) Comments: CURRENT DOSE: 2mg  tablets - 1 tablet daily except 1 1/2 tablet on tues & fri No change - recheck in 2 weeks Shary Decamp  Sep 28, 2009 11:08 AM     Orders Added: 1)  Est. Patient Level I [16109] 2)  Protime [[id number]]

## 2010-06-28 NOTE — Assessment & Plan Note (Signed)
Summary: discuss results/kdc   Vital Signs:  Patient profile:   77 year old female Weight:      151 pounds Pulse rate:   62 / minute BP sitting:   108 / 68  (left arm)  Vitals Entered By: Doristine Devoid (July 06, 2009 3:31 PM) CC: discuss results    History of Present Illness: here to discuss CT done 06-29-09 Mild osteopenia. Grade 1 anterolisthesis of L5 on S1.   Multifactorial central canal stenosis at L4-L5  L3-L4 disc bulge as well.  Allergies: 1)  ! Penicillin 2)  ! Sulfa 3)  ! Septra 4)  ! Flagyl 5)  ! Vicodin 6)  ! Tramadol Hcl 7)  Prednisone  Past History:  Past Medical History:  Atrial fibrillation (Dx 10-03 per Event monitor) Coronary artery disease Hyperlipidemia Hypertension Cerebrovascular accident, hx  (10-03 L MCA territory) Allergic rhinitis GERD, h/o esophageal stricture ? fibromyalgia Moh's surgery 11-06 Allergic rhinitis Diverticulosis h/o Adenomatous colon polyps abnormal CT chest ("innumerable small nodules"), sees Dr Delton Coombes   Past Surgical History: Reviewed history from 06/28/2009 and no changes required. Cholecystectomy-1991 Hysterectomy-1978 Cataract extraction-bilateral Appendectomy Moh's surgery 11-06  Social History: Reviewed history from 03/09/2008 and no changes required. lives by self still drives ADL independent Widow neighbor very close to her [place]) 3 kids Patient has never smoked.  Alcohol Use - no Daily Caffeine Use Illicit Drug Use - no Patient gets regular exercise.  Review of Systems       the patient does have  low back and gluteal  pain for a while,   left-sided with occasional radiation to the left leg. symptoms are worse if she stands for a while or walk denies lower extremity paresthesias no bowel incontinence, some bladder incontinence in the past she try Ultram but it felt very strong, currently taking Tylenol at night sometimes  Physical Exam  General:  alert and well-developed.   Msk:   slightly tender over the left little sacral spine, not tender at the gluteal area Neurologic:  lower extremity strength is symmetric, reflexes are symmetric (absent in the ankles)   Impression & Recommendations:  Problem # 1:  BACK PAIN (ICD-724.5) chronic low back pain w/  document OA by CT currently taking Tylenol as needed in the past Ultram  was felt to be too strong by the patient I recommend her to cont.  Tylenol, see instructions I offer her a referral to ortho , they likely will get a MRI to get a better sense of her anatomy, local injections? PT may be of some help Her updated medication list for this problem includes:    Aspirin Ec 81 Mg Tbec (Aspirin) .Marland Kitchen... Take 1 tablet by mouth once a day  Problem # 2:  ? of OSTEOPENIA (ICD-733.90) osteopenia? The CT showed evidence of osteopenia chart is reviewed at, she had a Dexa: 2003 and DEXA again 09-2007 (normal)  plan-- Repeat a DEXA 5- 2011  Complete Medication List: 1)  Aspirin Ec 81 Mg Tbec (Aspirin) .... Take 1 tablet by mouth once a day 2)  Bupropion Hcl 100 Mg Tabs (Bupropion hcl) .... Take 1 tablet by mouth twice a day 3)  Metoprolol Tartrate 50 Mg Tabs (Metoprolol tartrate) .... 1/2 by mouth bid 4)  Flonase 50 Mcg/act Susp (Fluticasone propionate) .... Take 2 sprays in each nostril once a day 5)  Nitroquick 0.4 Mg Subl (Nitroglycerin) .... As needed 6)  Simvastatin 40 Mg Tabs (Simvastatin) .Marland Kitchen.. 1 by mouth once daily 7)  Multivitamins Tabs (Multiple vitamin) .... Take 1 tablet by mouth once a day 8)  Calcium & Vitamin D  .... Take 1 tablet by mouth once a day 9)  Coumadin 2 Mg Tabs (Warfarin sodium) .... Take one tablet daily 10)  Lanoxin 0.125 Mg Tabs (Digoxin) .Marland Kitchen.. 1 by mouth once daily 11)  Loratadine 10 Mg Tabs (Loratadine) .... As needed 12)  Famotidine 20 Mg Tabs (Famotidine) .... As needed  Patient Instructions: 1)  Take 650 mg of tylenol every 4  hours as needed for relief of pain  2)  Avoid taking more than  4000 mg in a 24 hour period( can cause liver damage in higher doses).   Appended Document: discuss results/kdc     Clinical Lists Changes  Orders: Added new Referral order of Orthopedic Referral (Ortho) - Signed

## 2010-06-28 NOTE — Letter (Signed)
Summary: dysuria, UCX sent, Rx macrobid--[facility]  [facility]   Imported By: Lanelle Bal 10/18/2009 10:05:23  _____________________________________________________________________  External Attachment:    Type:   Image     Comment:   External Document

## 2010-06-28 NOTE — Assessment & Plan Note (Signed)
Summary: pt/cbs   Nurse Visit   Vital Signs:  Patient profile:   77 year old female Weight:      145.25 pounds Pulse rate:   76 / minute Pulse rhythm:   regular BP sitting:   124 / 82  (left arm) Cuff size:   regular  Vitals Entered By: Army Fossa CMA (December 01, 2009 10:47 AM) CC: PT/INR   Allergies: 1)  ! Penicillin 2)  ! Sulfa 3)  ! Septra 4)  ! Flagyl 5)  ! Vicodin 6)  ! Tramadol Hcl 7)  Prednisone Laboratory Results   Blood Tests      INR: 2.6   (Normal Range: 0.88-1.12   Therap INR: 2.0-3.5) Comments: Has 2 mg tabs takes 1 tab daily except tues and fri takes 1 1/2 tabs ..................................................................... no change , continue with the same medicines Next INR 4 weeks Lorren Splawn E. Laneya Gasaway MD  December 02, 2009 1:33 PM   Pt is aware. Army Fossa CMA  December 02, 2009 1:44 PM     Orders Added: 1)  Est. Patient Level I [99211] 2)  Protime [[id number]]

## 2010-06-28 NOTE — Assessment & Plan Note (Signed)
Summary: pt check/kdc   Nurse Visit   Vital Signs:  Patient profile:   77 year old female Weight:      148.6 pounds BP sitting:   100 / 60  Vitals Entered By: Shary Decamp (June 01, 2009 1:52 PM)   Allergies: 1)  ! Penicillin 2)  ! Sulfa 3)  ! Septra 4)  ! Flagyl 5)  ! Vicodin 6)  Prednisone Laboratory Results   Blood Tests     PT: 15.6 s   (Normal Range: 10.6-13.4)  INR: 1.6   (Normal Range: 0.88-1.12   Therap INR: 2.0-3.5) Comments: CURRENT DOSE: 2MG  TABLETS - 1 by mouth once daily except 1 1/2 on tues & thurs (16mg  weekly) Missed 1 dose Shary Decamp  June 01, 2009 1:53 PM NEW 2mg  tab: 1.5 tab a day, 1 tab Tuesday and Friday recheck 2 weeks  19mg /week Jose E. Paz MD  June 01, 2009 1:58 PM     Orders Added: 1)  Est. Patient Level I [16109] 2)  Protime [[id number]]   Patient Instructions: 1)  1 1/2 tablet daily except 1 tablet on tues & fri 2)  recheck in 2 weeks

## 2010-06-28 NOTE — Assessment & Plan Note (Signed)
Summary: pain on her side/pt check/kdc   Vital Signs:  Patient profile:   77 year old female Height:      67 inches Weight:      150 pounds O2 Sat:      100 % Temp:     97.7 degrees F Pulse rate:   62 / minute BP sitting:   102 / 50  Vitals Entered By: Shary Decamp (July 16, 2009 10:59 AM) CC: ACUTE ONLY Comments    - LLQ pain x this am  - bloated, sore  - no nausea, vomiting, diarrhea, constipation  - normal BM yesterday Shary Decamp  July 16, 2009 11:00 AM    History of Present Illness: woke up today with left lower quadrant abdominal pain on and off pain is worse when she walks or moves in certain positions no  radiation she feels slightly bloated in the lower abdomen  review of systems no fever no nausea, vomiting, diarrhea bowel movements normal, last BM yesterday.  No blood in the stools no dysuria or gross hematuria  additionally, she saw Dr. Ethelene Hal for her back pain yesterday, she was prescribed PT her back pain is at baseline  Allergies: 1)  ! Penicillin 2)  ! Sulfa 3)  ! Septra 4)  ! Flagyl 5)  ! Vicodin 6)  ! Tramadol Hcl 7)  Prednisone  Past History:  Past Medical History: Reviewed history from 07/06/2009 and no changes required.  Atrial fibrillation (Dx 10-03 per Event monitor) Coronary artery disease Hyperlipidemia Hypertension Cerebrovascular accident, hx  (10-03 L MCA territory) Allergic rhinitis GERD, h/o esophageal stricture ? fibromyalgia Moh's surgery 11-06 Allergic rhinitis Diverticulosis h/o Adenomatous colon polyps abnormal CT chest ("innumerable small nodules"), sees Dr Delton Coombes   Past Surgical History: Reviewed history from 06/28/2009 and no changes required. Cholecystectomy-1991 Hysterectomy-1978 Cataract extraction-bilateral Appendectomy Moh's surgery 11-06  Social History: Reviewed history from 03/09/2008 and no changes required. lives by self still drives [employer] independent Widow neighbor very close to her  [name]) 3 kids Patient has never smoked.  Alcohol Use - no Daily Caffeine Use Illicit Drug Use - no Patient gets regular exercise.  Review of Systems      See HPI  Physical Exam  General:  alert and well-developed.  alert and well-developed.   Abdomen:  soft, no distention,  slightly tender without rebound at the LLQ (close to the iliac crest) Abdominal wall is symmetric, no ecchymoses, no hernia noted.  Msk:  slightly tender over the left little sacral spine, not tender at the gluteal area Psych:  not anxious appearing and not depressed appearing.  not anxious appearing and not depressed appearing.     Impression & Recommendations:  Problem # 1:  ABDOMINAL PAIN, LEFT LOWER QUADRANT (ICD-789.04) complain of LLQ pain since this morning, mechanical features  the patient is on Coumadin, no evidence of abdominal wall hematoma GI review of systems negative urinalysis showed trace of blood , she has no urinary symptoms Plan: Hemoglobin-- 12.6 slightly below baseline, check a CBC urine culture observation, Tylenol p.r.n., ER if symptoms increase. Antibiotics if UCX  turns  out positive Orders: UA Dipstick w/o Micro (manual) (30865) Hgb (85018) Fingerstick (36416) TLB-CBC Platelet - w/Differential (85025-CBCD)  Problem # 2:  ANTICOAGULATION THERAPY (ICD-V58.61) check INR Orders: Venipuncture (78469) TLB-PT (Protime) (85610-PTP)  Complete Medication List: 1)  Aspirin Ec 81 Mg Tbec (Aspirin) .... Take 1 tablet by mouth once a day 2)  Bupropion Hcl 100 Mg Tabs (Bupropion hcl) .... Take 1 tablet  by mouth twice a day 3)  Metoprolol Tartrate 50 Mg Tabs (Metoprolol tartrate) .... 1/2 by mouth bid 4)  Flonase 50 Mcg/act Susp (Fluticasone propionate) .... Take 2 sprays in each nostril once a day 5)  Nitroquick 0.4 Mg Subl (Nitroglycerin) .... As needed 6)  Simvastatin 40 Mg Tabs (Simvastatin) .Marland Kitchen.. 1 by mouth once daily 7)  Multivitamins Tabs (Multiple vitamin) .... Take 1 tablet by  mouth once a day 8)  Calcium & Vitamin D  .... Take 1 tablet by mouth once a day 9)  Coumadin 2 Mg Tabs (Warfarin sodium) .... Take one tablet daily 10)  Lanoxin 0.125 Mg Tabs (Digoxin) .Marland Kitchen.. 1 by mouth once daily 11)  Loratadine 10 Mg Tabs (Loratadine) .... As needed 12)  Famotidine 20 Mg Tabs (Famotidine) .... As needed  Other Orders: Specimen Handling (16109) T-Culture, Urine ([id number]) T-Urine Microscopic ([id number])  Laboratory Results   Urine Tests    Routine Urinalysis   Glucose: negative   (Normal Range: Negative) Bilirubin: negative   (Normal Range: Negative) Ketone: negative   (Normal Range: Negative) Spec. Gravity: <1.005   (Normal Range: 1.003-1.035) Blood: TRACE   (Normal Range: Negative) pH: 6.5   (Normal Range: 5.0-8.0) Protein: negative   (Normal Range: Negative) Urobilinogen: 0.2   (Normal Range: 0-1) Nitrite: negative   (Normal Range: Negative) Leukocyte Esterace: trace   (Normal Range: Negative)     CBC   HGB:  12.6 g/dL   (Normal Range: 13.0-86.5 in Males, 12.0-15.0 in Females)     Laboratory Results   Urine Tests    Routine Urinalysis   Glucose: negative   (Normal Range: Negative) Bilirubin: negative   (Normal Range: Negative) Ketone: negative   (Normal Range: Negative) Spec. Gravity: <1.005   (Normal Range: 1.003-1.035) Blood: TRACE   (Normal Range: Negative) pH: 6.5   (Normal Range: 5.0-8.0) Protein: negative   (Normal Range: Negative) Urobilinogen: 0.2   (Normal Range: 0-1) Nitrite: negative   (Normal Range: Negative) Leukocyte Esterace: trace   (Normal Range: Negative)    CBC HGB:  12.6 g/dL   (Normal Range: 78.4-69.6 in Males, 12.0-15.0 in Females)

## 2010-06-28 NOTE — Letter (Signed)
Summary: recurrent UTI---- [facility]  [facility]   Imported By: Lanelle Bal 02/22/2010 13:54:38  _____________________________________________________________________  External Attachment:    Type:   Image     Comment:   External Document

## 2010-06-28 NOTE — Assessment & Plan Note (Signed)
Summary: pt check///sph   Nurse Visit   Vital Signs:  Patient profile:   77 year old female Height:      67 inches (170.18 cm) Weight:      148.25 pounds (67.39 kg) BMI:     23.30 Temp:     97.6 degrees F (36.44 degrees C) oral BP sitting:   114 / 66  (left arm) Cuff size:   regular  Vitals Entered By: Lucious Groves CMA (March 30, 2010 10:43 AM) CC: PT check./kb Comments Patient and her neighbor made me aware that the patient has had her bone density and mommogram in either either March or April of this year. Added note in prevention screen./kb   Allergies: 1)  ! Penicillin 2)  ! Sulfa 3)  ! Septra 4)  ! Flagyl 5)  ! Vicodin 6)  ! Tramadol Hcl 7)  Prednisone Laboratory Results   Blood Tests   Date/Time Received: Lucious Groves [facility]  March 30, 2010 10:43 AM  Date/Time Reported: Lucious Groves CMA  March 30, 2010 10:43 AM    INR: 3.1   (Normal Range: 0.88-1.12   Therap INR: 2.0-3.5) Comments: Patient notes that she has 2mg  tabs at home and takes once by mouth once daily except for Tues and Fri when she takes 1.5 tabs. She notes that she has eaten a lot of greens and sweets. She also notes that she had one nose bleed about 2 weeks ago that was 10 minutes in duration. Please advise. Lucious Groves CMA  March 30, 2010 10:45 AM  no change, 4 weeks. call if bleeding persisten or severe Geet Hosking E. Karlissa Aron MD  March 30, 2010 3:30 PM   Patient notified. Lucious Groves CMA  March 30, 2010 4:08 PM     Orders Added: 1)  Est. Patient Level I [99211] 2)  Protime [[id number]]   ANTICOAGULATION RECORD PREVIOUS REGIMEN & LAB RESULTS Anticoagulation Diagnosis:  Atrial fibrillation on  09/17/2007  Previous INR:  2.9 on  02/28/2010 Previous Coumadin Dose(mg):  2mg  qd except Tues & Fri when she takes 3mg  on  02/28/2010 Previous Regimen:  SAME on  07/02/2008 Previous Coagulation Comments:  currently on 1 tab once daily except 1/2 tab on tues, thurs on  08/21/2007  NEW REGIMEN & LAB  RESULTS Current INR: 3.1 Regimen: SAME  (no change)   Anticoagulation Visit Questionnaire Coumadin dose missed/changed:  No Abnormal Bleeding Symptoms:  Yes    Bruising or bleeding from nose or gums, in urine or stool since the last visit:  nose bleed 2 weeks ago 10 min. duration Any diet changes including alcohol intake, vegetables or greens since the last visit:  Yes      Diet Comments:Patient notes that she has been eating a lot of greens and sweets Any illnesses or hospitalizations since the last visit:  No Any signs of clotting since the last visit (including chest discomfort, dizziness, shortness of breath, arm tingling, slurred speech, swelling or redness in leg):  No  MEDICATIONS ASPIRIN EC 81 MG TBEC (ASPIRIN) Take 1 tablet by mouth once a day BUPROPION HCL 100 MG TABS (BUPROPION HCL) Take 1 tablet by mouth twice a day METOPROLOL TARTRATE 50 MG  TABS (METOPROLOL TARTRATE) 1/2 by mouth bid FLONASE 50 MCG/ACT  SUSP (FLUTICASONE PROPIONATE) Take 2 sprays in each nostril once a day NITROQUICK 0.4 MG  SUBL (NITROGLYCERIN) as needed SIMVASTATIN 40 MG TABS (SIMVASTATIN) 1 by mouth daily at bedtime. MULTIVITAMINS  TABS (MULTIPLE  VITAMIN) Take 1 tablet by mouth once a day * CALCIUM & VITAMIN D Take 1 tablet by mouth once a day WARFARIN SODIUM 2 MG TABS (WARFARIN SODIUM) 1 DAILY EXCEPT 1 1/2 ON TUE AND FRI LANOXIN 0.125 MG TABS (DIGOXIN) 1 by mouth once daily FAMOTIDINE 20 MG TABS (FAMOTIDINE) as needed * TOLIET SEAT WITH HANDLES dx 724.5, 781.2 CEFPROZIL 500 MG TABS (CEFPROZIL) One by mouth twice a day   Preventive Care Screening  Bone Density:    Date:  08/27/2009    Results:  historical std dev  Mammogram:    Date:  08/27/2009    Results:  historical

## 2010-06-28 NOTE — Procedures (Signed)
Summary: EGD/[facility]  EGD/[facility]   Imported By: Sherian Rein 06/29/2009 10:07:06  _____________________________________________________________________  External Attachment:    Type:   Image     Comment:   External Document

## 2010-06-28 NOTE — Progress Notes (Signed)
Summary: lab results  Phone Note Call from Patient Call back at Home Phone [phone]   Caller: Patient Summary of Call: pt called wants results, also wanted PT and INR results, (results was given by Alena Bills)  INR results given again, also pt informed labs not read by Dr yet, will call Initial call taken by: Kandice Hams,  July 20, 2009 3:43 PM

## 2010-06-28 NOTE — Assessment & Plan Note (Signed)
Summary: PT CHECK/KN      Allergies Added:  Nurse Visit   Vital Signs:  Patient profile:   77 year old female Height:      67 inches Weight:      148 pounds Pulse rate:   68 / minute Resp:     18 per minute BP sitting:   100 / 58  (left arm)  Vitals Entered By: Jeremy Johann CMA (April 29, 2010 10:09 AM) CC: PT/INR Comments Missed digoxin on yesterday    Current Medications (verified): 1)  Aspirin Ec 81 Mg Tbec (Aspirin) .... Take 1 Tablet By Mouth Once A Day 2)  Bupropion Hcl 100 Mg Tabs (Bupropion Hcl) .... Take 1 Tablet By Mouth Twice A Day 3)  Metoprolol Tartrate 50 Mg  Tabs (Metoprolol Tartrate) .... 1/2 By Mouth Bid 4)  Flonase 50 Mcg/act  Susp (Fluticasone Propionate) .... Take 2 Sprays in Each Nostril Once A Day 5)  Nitroquick 0.4 Mg  Subl (Nitroglycerin) .... As Needed 6)  Simvastatin 40 Mg Tabs (Simvastatin) .Marland Kitchen.. 1 By Mouth Daily At Bedtime. 7)  Multivitamins  Tabs (Multiple Vitamin) .... Take 1 Tablet By Mouth Once A Day 8)  Calcium & Vitamin D .... Take 1 Tablet By Mouth Once A Day 9)  Warfarin Sodium 2 Mg Tabs (Warfarin Sodium) .Marland Kitchen.. 1 Daily Except 1 1/2 On Tue and Fri 10)  Lanoxin 0.125 Mg Tabs (Digoxin) .Marland Kitchen.. 1 By Mouth Once Daily 11)  Famotidine 20 Mg Tabs (Famotidine) .... As Needed 12)  [place] Seat With Handles .... Dx 724.5, 781.2 13)  Cefprozil 500 Mg Tabs (Cefprozil) .... One By Mouth Twice A Day 14)  Lidoderm 5 % Ptch (Lidocaine) .... Apply 1 or 2 Patches To Area of Pain and Remove After 12 Hours  Allergies (verified): 1)  ! Penicillin 2)  ! Sulfa 3)  ! Septra 4)  ! Flagyl 5)  ! Vicodin 6)  ! Tramadol Hcl 7)  Prednisone Laboratory Results   Blood Tests      INR: 2.2   (Normal Range: 0.88-1.12   Therap INR: 2.0-3.5) Comments: Current dose:(2mg ) 1 tab daily except (2.5 mg) 1 1/2 Tues,Fri....Marland KitchenMarland KitchenFelecia Deloach CMA  April 29, 2010 10:16 AM  advise patient:: no change on her Coumadin, recheck in 4 weeks Jose E. Paz MD  April 29, 2010  4:41 PM   Pt is aware. Army Fossa CMA  April 29, 2010 4:43 PM     Orders Added: 1)  Est. Patient Level I [99211] 2)  Protime [[id number]]    ANTICOAGULATION RECORD PREVIOUS REGIMEN & LAB RESULTS Anticoagulation Diagnosis:  Atrial fibrillation on  09/17/2007  Previous INR:  3.1 on  03/30/2010 Previous Coumadin Dose(mg):  2mg  qd except Tues & Fri when she takes 3mg  on  02/28/2010 Previous Regimen:  SAME on  07/02/2008 Previous Coagulation Comments:  currently on 1 tab once daily except 1/2 tab on tues, thurs on  08/21/2007  NEW REGIMEN & LAB RESULTS Current INR: 2.2 Current Coumadin Dose(mg): (2mg ) 1 tab daily except (2.5 mg) 1 1/2 Tues,Fri Regimen: SAME  (no change)  Provider: Drue Novel

## 2010-06-28 NOTE — Progress Notes (Signed)
Summary: refill  Phone Note Refill Request Message from:  Patient on February 21, 2010 2:33 PM  Refills Requested: Medication #1:  METOPROLOL TARTRATE 50 MG  TABS 1/2 by mouth bid patient [facility] [address] - she is out of med  Initial call taken by: Okey Regal Spring,  February 21, 2010 2:34 PM    Prescriptions: METOPROLOL TARTRATE 50 MG  TABS (METOPROLOL TARTRATE) 1/2 by mouth bid  #30 Tablet x 2   Entered by:   Army Fossa CMA   Authorized by:   Nolon Rod. Paz MD   Signed by:   Army Fossa CMA on 02/21/2010   Method used:   [facility]  [address] [phone]       [address]       [place], [place]  [address]       Ph: [phone] or [phone]       Fax: [phone]   RxID:   [id number]

## 2010-06-28 NOTE — Assessment & Plan Note (Signed)
Summary: pt/cbs   Nurse Visit   Vital Signs:  Patient profile:   77 year old female Weight:      145 pounds Pulse rate:   62 / minute Pulse rhythm:   regular BP sitting:   126 / 82  (left arm) Cuff size:   regular  Vitals Entered By: Army Fossa CMA (December 31, 2009 9:51 AM) CC: PT/INR   Current Medications (verified): 1)  Aspirin Ec 81 Mg Tbec (Aspirin) .... Take 1 Tablet By Mouth Once A Day 2)  Bupropion Hcl 100 Mg Tabs (Bupropion Hcl) .... Take 1 Tablet By Mouth Twice A Day 3)  Metoprolol Tartrate 50 Mg  Tabs (Metoprolol Tartrate) .... 1/2 By Mouth Bid 4)  Flonase 50 Mcg/act  Susp (Fluticasone Propionate) .... Take 2 Sprays in Each Nostril Once A Day 5)  Nitroquick 0.4 Mg  Subl (Nitroglycerin) .... As Needed 6)  Simvastatin 40 Mg  Tabs (Simvastatin) .Marland Kitchen.. 1 By Mouth Once Daily 7)  Multivitamins  Tabs (Multiple Vitamin) .... Take 1 Tablet By Mouth Once A Day 8)  Calcium & Vitamin D .... Take 1 Tablet By Mouth Once A Day 9)  Warfarin Sodium 2 Mg Tabs (Warfarin Sodium) .Marland Kitchen.. 1 Daily Except 1 1/2 On Tue and Fri 10)  Lanoxin 0.125 Mg Tabs (Digoxin) .Marland Kitchen.. 1 By Mouth Once Daily 11)  Loratadine 10 Mg Tabs (Loratadine) .... As Needed 12)  Famotidine 20 Mg Tabs (Famotidine) .... As Needed 13)  Toliet Seat With Handles .... Dx 724.5, 781.2  Allergies: 1)  ! Penicillin 2)  ! Sulfa 3)  ! Septra 4)  ! Flagyl 5)  ! Vicodin 6)  ! Tramadol Hcl 7)  Prednisone Laboratory Results   Blood Tests      INR: 2.8   (Normal Range: 0.88-1.12   Therap INR: 2.0-3.5) Comments: has 2 mg tabs takes 1 pill every day except tues, thurs 1.5 tabs ---------------------------------------------------------------- Advised patient Continue taking the same medicines INR in 4  weeks Jose E. Paz MD  December 31, 2009 3:35 PM   Pt is aware. Army Fossa CMA  December 31, 2009 3:36 PM     Orders Added: 1)  Est. Patient Level I [99211] 2)  Protime [16109UE]

## 2010-06-28 NOTE — Progress Notes (Signed)
Summary: refill  Phone Note Refill Request Message from:  Fax from Pharmacy on April 08, 2010 8:45 AM  Refills Requested: Medication #1:  SIMVASTATIN 40 MG TABS 1 by mouth daily at bedtime. [facility] [address] [facility] - fax [phone]  Initial call taken by: Okey Regal [place],  April 08, 2010 8:46 AM    Prescriptions: SIMVASTATIN 40 MG TABS (SIMVASTATIN) 1 by mouth daily at bedtime.  #30 x 1   Entered by:   Army Fossa CMA   Authorized by:   Nolon Rod. Paz MD   Signed by:   Army Fossa CMA on 04/08/2010   Method used:   Electronically to        [facility]  [address] [phone]* (retail)       [address]       [place], [place]  [address]       Ph: [phone] or [phone]       Fax: [phone]   RxID:   [id number]

## 2010-06-28 NOTE — Progress Notes (Signed)
  Phone Note Call from Patient   Caller: Patient Summary of Call: pt called wanted to know if someone from our office called in in ref to her Ortho appt, spoke with Renee who says she has referral and working on it. Will call pt with appt day and time.  pt informed no one from our office called, our referral coordinator is working on referral .Kandice Hams  July 09, 2009 2:07 PM  Initial call taken by: Kandice Hams,  July 09, 2009 2:07 PM

## 2010-06-28 NOTE — Assessment & Plan Note (Signed)
Summary: emp//pt check//pt will fasting//lch   Vital Signs:  Patient profile:   77 year old female Height:      67 inches Weight:      144.13 pounds Pulse rate:   73 / minute Pulse rhythm:   regular BP sitting:   122 / 74  (left arm) Cuff size:   regular  Vitals Entered By: Army Fossa CMA (February 01, 2010 11:09 AM) CC: CPX- fasting Comments UTI?-- burns when she urinates -Flu shot -PT check   History of Present Illness: Here for Medicare AWV:  1.   Risk factors based on Past M, S, F history: yes  2.   Physical Activities: goes shopping , does house cleaning. No exercise program per se  3.   Depression/mood: symptoms well controlled on current meds  4.   Hearing: some problems , has seen the ear doctor  5.   ADL's: independent , still drives  6.   Fall Risk: no recent falls, low risk  7.   Home Safety: does feels safe at home  8.   Height, weight, &visual acuity: see VS. vision ok w/ glasses  9.   Counseling: see below  10.   Labs ordered based on risk factors: yes  11.           Referral Coordination: if needed  12.           Care Plan: see below  13.            Cognitive Assessment : memory, alertness  and motor skills seem appropriate  additionally we discussed the following UTI?--   dysuria x 3 days  Atrial fibrillation-- due for INR Hyperlipidemia-- good medication compliance  Hypertension-- no ambulatory BPs but reports good medication compliance GERD, symptoms well controlled on famotidine    Current Medications (verified): 1)  Aspirin Ec 81 Mg Tbec (Aspirin) .... Take 1 Tablet By Mouth Once A Day 2)  Bupropion Hcl 100 Mg Tabs (Bupropion Hcl) .... Take 1 Tablet By Mouth Twice A Day 3)  Metoprolol Tartrate 50 Mg  Tabs (Metoprolol Tartrate) .... 1/2 By Mouth Bid 4)  Flonase 50 Mcg/act  Susp (Fluticasone Propionate) .... Take 2 Sprays in Each Nostril Once A Day 5)  Nitroquick 0.4 Mg  Subl (Nitroglycerin) .... As Needed 6)  Simvastatin 40 Mg  Tabs  (Simvastatin) .Marland Kitchen.. 1 By Mouth Once Daily 7)  Multivitamins  Tabs (Multiple Vitamin) .... Take 1 Tablet By Mouth Once A Day 8)  Calcium & Vitamin D .... Take 1 Tablet By Mouth Once A Day 9)  Warfarin Sodium 2 Mg Tabs (Warfarin Sodium) .Marland Kitchen.. 1 Daily Except 1 1/2 On Tue and Fri 10)  Lanoxin 0.125 Mg Tabs (Digoxin) .Marland Kitchen.. 1 By Mouth Once Daily 11)  Famotidine 20 Mg Tabs (Famotidine) .... As Needed 12)  Toliet Seat With Handles .... Dx 724.5, 781.2 13)  Cefprozil 500 Mg Tabs (Cefprozil) .... One By Mouth Twice A Day  Allergies: 1)  ! Penicillin 2)  ! Sulfa 3)  ! Septra 4)  ! Flagyl 5)  ! Vicodin 6)  ! Tramadol Hcl 7)  Prednisone  Past History:  Past Medical History:  Atrial fibrillation (Dx 10-03 per Event monitor) Coronary artery disease Hyperlipidemia Hypertension Cerebrovascular accident, hx  (10-03 L MCA territory) Allergic rhinitis GERD, h/o esophageal stricture ? fibromyalgia Moh's surgery 11-06 Allergic rhinitis Diverticulosis h/o Adenomatous colon polyps abnormal CT chest ("innumerable small nodules"), sees Dr Delton Coombes   Past Surgical History: Reviewed history  from 06/28/2009 and no changes required. Cholecystectomy-1991 Hysterectomy-1978 Cataract extraction-bilateral Appendectomy Moh's surgery 11-06  Family History: Reviewed history from 12/21/2008 and no changes required. Heart Disease: mother and father Diabetes: mother and father  Breast Ca--no  Colon Cancer-- no Colon Polyps:mother  Social History: Reviewed history from 03/09/2008 and no changes required. lives by self still drives ADL independent Widow neighbor very close to her [name]) 3 kids Patient has never smoked.  Alcohol Use - no Daily Caffeine Use    Review of Systems CV:  Denies chest pain or discomfort and swelling of feet; occasionally palpitation, heart " going fast", mostly w/ exertion, no CP, no nausea or diaphoresis . Resp:  Denies coughing up blood and sputum productive; some cough  . GI:  Denies nausea and vomiting. GU:  Denies hematuria; back pain at baseline  + frecuency .  Physical Exam  General:  alert, well-developed, and well-nourished.   Neck:  no masses, no thyromegaly, and normal carotid upstroke.   Breasts:  No mass, nodules, thickening, tenderness, bulging, retraction, inflamation, nipple discharge or skin changes noted.  no axillary lymphadenopathy  Lungs:  normal respiratory effort, no intercostal retractions, no accessory muscle use, and normal breath sounds.   Heart:  irreg Abdomen:  soft, non-tender, no distention, no masses, no guarding, and no rigidity.   Extremities:  .nw  Psych:  not anxious appearing and not depressed appearing.  not anxious appearing and not depressed appearing.     Impression & Recommendations:  Problem # 1:  HEALTH SCREENING (ICD-V70.0)  Td 2006 pneumonia shot 11-2007 flu shot-- today had shingles and the shot as well  Cscope 10-04: incomplete but (-),  reports she did have a f/u  BE  (Dr Russella Dar per pt) Cscope again 11-2007 incomplete, then had a ACBE 02-2008:  "Study is severely limited for detection of polyps" next colonoscopy per GI  Dexa: 2003 , DEXA again 09-2007 (normal) , last DEXA 5-11 neg    MMG neg 08-2009, recommend to continue with self breast exam, breast exam today performed, see above  no PAP in a while , s/p a  complete hysterectomy , no vag - no vag bleed.  does not desires a Pap smear at this point  diet and exercise discussed      Orders: Medicare -1st Annual Wellness Visit [phone])  Problem # 2:  UTI (ICD-599.0) UTI? urinalysis negative, recent urine culture negative. If symptoms persist recommend to see urology  Her updated medication list for this problem includes:    Cefprozil 500 Mg Tabs (Cefprozil) ..... One by mouth twice a day  Orders: UA Dipstick w/o Micro (automated)  (81003)  Problem # 3:  ATRIAL FIBRILLATION (ICD-427.31) rate well controlled, on Coumadin, occasionally has  palpitations without associated symptoms. Recommend observation, Patient to call if symptoms severe, chest pain, shortness or breath, diaphoresis or nausea INR appropriate Her updated medication list for this problem includes:    Aspirin Ec 81 Mg Tbec (Aspirin) .Marland Kitchen... Take 1 tablet by mouth once a day    Metoprolol Tartrate 50 Mg Tabs (Metoprolol tartrate) .Marland Kitchen... 1/2 by mouth bid    Warfarin Sodium 2 Mg Tabs (Warfarin sodium) .Marland Kitchen... 1 daily except 1 1/2 on tue and fri    Lanoxin 0.125 Mg Tabs (Digoxin) .Marland Kitchen... 1 by mouth once daily  Orders: Protime (60454UJ) TLB-Digoxin (Lanoxin) (80162-DIG) Specimen Handling (81191)  Reviewed the following: PT: 2.4 (09/07/2009)   INR: 2.3 (02/01/2010) Next Protime: 4 WEEK (dated on 07/02/2008)  Problem # 4:  HYPERTENSION (ICD-401.9)  at goal  Her updated medication list for this problem includes:    Metoprolol Tartrate 50 Mg Tabs (Metoprolol tartrate) .Marland Kitchen... 1/2 by mouth bid  BP today: 122/74 Prior BP: 136/82 (01/17/2010)  Labs Reviewed: K+: 4.2 (06/28/2009) Creat: : 0.9 (06/28/2009)   Chol: 186 (12/21/2008)   HDL: 47.20 (12/21/2008)   LDL: 85 (06/13/2007)   TG: 220.0 (12/21/2008)  Orders: TLB-BMP (Basic Metabolic Panel-BMET) (80048-METABOL)  Problem # 5:  HYPERLIPIDEMIA (ICD-272.4)  Her updated medication list for this problem includes:    Simvastatin 40 Mg Tabs (Simvastatin) .Marland Kitchen... 1 by mouth once daily  Orders: Venipuncture (40347) TLB-ALT (SGPT) (84460-ALT) TLB-AST (SGOT) (84450-SGOT) TLB-Lipid Panel (80061-LIPID) Specimen Handling (42595)  Problem # 6:  GERD (ICD-530.81) well-controlled Her updated medication list for this problem includes:    Famotidine 20 Mg Tabs (Famotidine) .Marland Kitchen... As needed  Complete Medication List: 1)  Aspirin Ec 81 Mg Tbec (Aspirin) .... Take 1 tablet by mouth once a day 2)  Bupropion Hcl 100 Mg Tabs (Bupropion hcl) .... Take 1 tablet by mouth twice a day 3)  Metoprolol Tartrate 50 Mg Tabs (Metoprolol  tartrate) .... 1/2 by mouth bid 4)  Flonase 50 Mcg/act Susp (Fluticasone propionate) .... Take 2 sprays in each nostril once a day 5)  Nitroquick 0.4 Mg Subl (Nitroglycerin) .... As needed 6)  Simvastatin 40 Mg Tabs (Simvastatin) .Marland Kitchen.. 1 by mouth once daily 7)  Multivitamins Tabs (Multiple vitamin) .... Take 1 tablet by mouth once a day 8)  Calcium & Vitamin D  .... Take 1 tablet by mouth once a day 9)  Warfarin Sodium 2 Mg Tabs (Warfarin sodium) .Marland Kitchen.. 1 daily except 1 1/2 on tue and fri 10)  Lanoxin 0.125 Mg Tabs (Digoxin) .Marland Kitchen.. 1 by mouth once daily 11)  Famotidine 20 Mg Tabs (Famotidine) .... As needed 12)  Toliet Seat With Handles  .... Dx 724.5, 781.2 13)  Cefprozil 500 Mg Tabs (Cefprozil) .... One by mouth twice a day  Other Orders: Flu Vaccine 59yrs + MEDICARE PATIENTS (G3875) Administration Flu vaccine - MCR (I4332) and  Patient Instructions: 1)  Continue taking the same dose of Coumadin 2)  repeat the Coumadin check  in 4  weeks 3)  Please schedule a follow-up appointment in 3 months .   Laboratory Results   Urine Tests    Routine Urinalysis   Color: yellow Appearance: Clear Glucose: negative   (Normal Range: Negative) Bilirubin: negative   (Normal Range: Negative) Ketone: negative   (Normal Range: Negative) Spec. Gravity: 1.020   (Normal Range: 1.003-1.035) Blood: negative   (Normal Range: Negative) pH: 6.0   (Normal Range: 5.0-8.0) Protein: negative   (Normal Range: Negative) Urobilinogen: 0.2   (Normal Range: 0-1) Nitrite: negative   (Normal Range: Negative) Leukocyte Esterace: negative   (Normal Range: Negative)    Comments: Army Fossa CMA  February 01, 2010 11:22 AM   Blood Tests      INR: 2.3   (Normal Range: 0.88-1.12   Therap INR: 2.0-3.5) Comments: 2mg  Tabs 1 daily except 1 1/2 tabs Tues, Fri   Flu Vaccine Consent Questions     Do you have a history of severe allergic reactions to this vaccine? no    Any prior history of allergic reactions  to egg and/or gelatin? no    Do you have a sensitivity to the preservative Thimersol? no    Do you have a past history of Guillan-Barre Syndrome? no    Do you currently have an  acute febrile illness? no    Have you ever had a severe reaction to latex? no    Vaccine information given and explained to patient? yes    Are you currently pregnant? no    Lot Number:[id number]   Exp Date:11/26/2010   Site Given  Left Deltoid IMu

## 2010-06-28 NOTE — Progress Notes (Signed)
Summary: FYI--PUTTING OFF PT CHECK  Phone Note Call from Patient Call back at Home Phone [phone]   Caller: Patient Summary of Call: Virginia Price---FYI  PATIENT CALLED THIS MORNING FOR PT CHECK AND WAS PUT ON SCHEDULE FOR NURSE VISIT--GOT HERE AND DECIDED SHE DIDNT WANT BLOOD DRAW IN LAB, BUT SHE SAID SHE WAS OVERDUE BY ONE WEEK  TO CHECK HER PT  (SAID SHE MAY COME IN TOMORROW WHEN DAUGHTER COMES IN FOR OFFICE VISIT, BUT SHE IS NOT SURE)  SHE SAID SHE WILL CHECK WITH [place] TO SEE IF MACHINE IS FIXED, BUT THIS NOTE IS TO LET YOU KNOW WHAT SHE SAID ABOUT "PUTTING IT OFF" Initial call taken by: Jerolyn Shin,  June 29, 2009 11:08 AM  Follow-up for Phone Call        advised pt needs inr this week, scheduled for thursday Follow-up by: Shary Decamp,  June 29, 2009 4:55 PM

## 2010-06-28 NOTE — Assessment & Plan Note (Signed)
Summary: pt check//ph   Nurse Visit   Vital Signs:  Patient profile:   77 year old female Height:      67 inches Weight:      146 pounds CC: PT check./kb   Allergies: 1)  ! Penicillin 2)  ! Sulfa 3)  ! Septra 4)  ! Flagyl 5)  ! Vicodin 6)  ! Tramadol Hcl 7)  Prednisone Laboratory Results   Blood Tests      INR: 2.9   (Normal Range: 0.88-1.12   Therap INR: 2.0-3.5) Comments: 2mg  tabs Takes 1 tab daily except Tues and Fri takes 1.5 mg tabs  --------------------------- No change, and recheck in 4 weeks Oleva Koo E. Raelle Chambers MD  March 01, 2010 4:53 PM   Left detialed message for pt. Army Fossa CMA  March 02, 2010 8:26 AM     Orders Added: 1)  Est. Patient Level I [99211] 2)  Protime [16109UE]   ANTICOAGULATION RECORD PREVIOUS REGIMEN & LAB RESULTS Anticoagulation Diagnosis:  Atrial fibrillation on  09/17/2007  Previous INR:  2.3 on  02/01/2010 Previous Coumadin Dose(mg):  1 TAB DAILY,EXCEPT 1 1/2 TAB TUES,FRI on  09/07/2009 Previous Regimen:  SAME on  07/02/2008 Previous Coagulation Comments:  currently on 1 tab once daily except 1/2 tab on tues, thurs on  08/21/2007  NEW REGIMEN & LAB RESULTS Current INR: 2.9 Current Coumadin Dose(mg): 2mg  qd except Tues & Fri when she takes 3mg  Regimen: SAME  (no change)  Other Comments: Patient has 2mg  tabs at home. She takes 1 tab by mouth once daily except Tues & Fri, when she takes 1.5 tabs. Lucious Groves CMA  February 28, 2010 10:50 AM   Patient notes that we can call her with recommendations at home after 1pm today. Lucious Groves CMA  February 28, 2010 10:52 AM    Anticoagulation Visit Questionnaire Coumadin dose missed/changed:  No Abnormal Bleeding Symptoms:  Yes    Bruising or bleeding from nose or gums, in urine or stool since the last visit:  Patient notes that she always has bruising. Any diet changes including alcohol intake, vegetables or greens since the last visit:  No Any illnesses or hospitalizations since  the last visit:  No Any signs of clotting since the last visit (including chest discomfort, dizziness, shortness of breath, arm tingling, slurred speech, swelling or redness in leg):  No      Signs of Clotting:  Patient notes that she only gets dizzy with bending over. Adverse Events: none  MEDICATIONS ASPIRIN EC 81 MG TBEC (ASPIRIN) Take 1 tablet by mouth once a day BUPROPION HCL 100 MG TABS (BUPROPION HCL) Take 1 tablet by mouth twice a day METOPROLOL TARTRATE 50 MG  TABS (METOPROLOL TARTRATE) 1/2 by mouth bid FLONASE 50 MCG/ACT  SUSP (FLUTICASONE PROPIONATE) Take 2 sprays in each nostril once a day NITROQUICK 0.4 MG  SUBL (NITROGLYCERIN) as needed SIMVASTATIN 40 MG TABS (SIMVASTATIN) 1 by mouth daily at bedtime. MULTIVITAMINS  TABS (MULTIPLE VITAMIN) Take 1 tablet by mouth once a day * CALCIUM & VITAMIN D Take 1 tablet by mouth once a day WARFARIN SODIUM 2 MG TABS (WARFARIN SODIUM) 1 DAILY EXCEPT 1 1/2 ON TUE AND FRI LANOXIN 0.125 MG TABS (DIGOXIN) 1 by mouth once daily FAMOTIDINE 20 MG TABS (FAMOTIDINE) as needed * TOLIET SEAT WITH HANDLES dx 724.5, 781.2 CEFPROZIL 500 MG TABS (CEFPROZIL) One by mouth twice a day

## 2010-06-28 NOTE — Assessment & Plan Note (Signed)
Summary: PT/CBS   Nurse Visit   Vital Signs:  Patient profile:   77 year old female Height:      67 inches Weight:      148.13 pounds BMI:     23.28 Pulse rate:   58 / minute  Vitals Entered By: Kandice Hams (Oct 15, 2009 11:09 AM)  Allergies: 1)  ! Penicillin 2)  ! Sulfa 3)  ! Septra 4)  ! Flagyl 5)  ! Vicodin 6)  ! Tramadol Hcl 7)  Prednisone Laboratory Results   Blood Tests      INR: 1.7   (Normal Range: 0.88-1.12   Therap INR: 2.0-3.5) Comments: current dose 2mg  tab 1 tab daily except  1 1/2 tab on tues and fri Per Dr Drue Novel: No change stay on same dose recheck in 2 weeks, pt informed .Kandice Hams  Oct 15, 2009 11:06 AM     Orders Added: 1)  Est. Patient Level I [99211] 2)  Protime [16109UE]

## 2010-06-28 NOTE — Assessment & Plan Note (Signed)
Summary: Virginia Price  Medications Added LORATADINE 10 MG TABS (LORATADINE) as needed FAMOTIDINE 20 MG TABS (FAMOTIDINE) as needed        Visit Type:  Follow-up Primary Provider:  Willow Ora, M.D.   CC:  atrial fibrillation.  History of Present Illness: The patient is seen for followup of atrial fibrillation.  She's not having any chest pain or significant palpitations.  She is on Coumadin.  Unfortunately there has been significant difficulty in controlling her levels.  She has had to have frequent blood studies.  With this in mind we may consider dabigatran.   Current Medications (verified): 1)  Aspirin Ec 81 Mg Tbec (Aspirin) .... Take 1 Tablet By Mouth Once A Day 2)  Bupropion Hcl 100 Mg Tabs (Bupropion Hcl) .... Take 1 Tablet By Mouth Twice A Day 3)  Metoprolol Tartrate 50 Mg  Tabs (Metoprolol Tartrate) .... 1/2 By Mouth Bid 4)  Flonase 50 Mcg/act  Susp (Fluticasone Propionate) .... Take 2 Sprays in Each Nostril Once A Day 5)  Nitroquick 0.4 Mg  Subl (Nitroglycerin) .... As Needed 6)  Simvastatin 40 Mg  Tabs (Simvastatin) .Marland Kitchen.. 1 By Mouth Once Daily 7)  Multivitamins  Tabs (Multiple Vitamin) .... Take 1 Tablet By Mouth Once A Day 8)  Calcium & Vitamin D .... Take 1 Tablet By Mouth Once A Day 9)  Coumadin 2 Mg Tabs (Warfarin Sodium) .... Take One Tablet Daily 10)  Lanoxin 0.125 Mg Tabs (Digoxin) .Marland Kitchen.. 1 By Mouth Once Daily 11)  Loratadine 10 Mg Tabs (Loratadine) .... As Needed 12)  Famotidine 20 Mg Tabs (Famotidine) .... As Needed  Allergies: 1)  ! Penicillin 2)  ! Sulfa 3)  ! Septra 4)  ! Flagyl 5)  ! Vicodin 6)  ! Tramadol Hcl 7)  Prednisone  Past History:  Past Medical History: Last updated: 06/03/2009 CORONARY ARTERY DISEASE (ICD-414.00) ATRIAL FIBRILLATION (ICD-427.31....chronic Coumadin RX ANTICOAGULATION THERAPY (ICD-V58.61) HYPERTENSION (ICD-401.9) HYPERLIPIDEMIA (ICD-272.4) IBS (ICD-564.1) DYSURIA (ICD-788.1) UTI (ICD-599.0) BACK PAIN (ICD-724.5) FECAL OCCULT  BLOOD (ICD-792.1) PERSONAL HX COLONIC POLYPS (ICD-V12.72) MALIGNANT MELANOMA, SKIN (ICD-172.9) CARCINOMA, BASAL CELL, HX OF (ICD-V10.83) GAIT IMBALANCE (ICD-781.2) SHOULDER PAIN (ICD-719.41) OVERACTIVE BLADDER (ICD-596.51) MEMORY LOSS (ICD-780.93) GERD (ICD-530.81) h/o esophageal stricture PULMONARY NODULE, RIGHT MIDDLE LOBE (ICD-518.89)  /  CT...stable...sept, 2010 ALLERGIC RHINITIS (ICD-477.9) RESTLESS LEG SYNDROME (ICD-333.94) HRT (ICD-V07.4) CEREBROVASCULAR ACCIDENT, HX OF (ICD-V12.50)  (10-03 L MCA territory)...2005 CHOLECYSTECTOMY, HX OF (ICD-V45.79) HYSTERECTOMY, HX OF (ICD-V45.77)   ? fibromyalgia Moh's surgery 11-06 Allergic rhinitis Diverticulosis h/o Adenomatous colon polyps abnormal CT chest ("innumerable small nodules"), sees Dr Delton Coombes  Coronaries normal....cath..2004  /  nuclear 2006...no ischemia  Review of Systems       Patient denies fever, chills, headache, sweats, rash, change in vision, change in hearing, chest pain, cough, shortness of breath, nausea or vomiting, urinary symptoms.  All other systems are reviewed and are negative.  Vital Signs:  Patient profile:   77 year old female Height:      67 inches Weight:      147 pounds BMI:     23.11 Pulse rate:   78 / minute BP sitting:   118 / 78  (left arm) Cuff size:   regular  Vitals Entered By: Hardin Negus, RMA (June 04, 2009 11:05 AM)  Physical Exam  General:  Patient is stable today. Head:  head is atraumatic. Eyes:  no xanthelasma. Neck:  no jugular venous distention. Chest Wall:  no chest wall tenderness. Lungs:  lungs are clear.  Respiratory effort  is nonlabored. Heart:  cardiac exam reveals the rhythm is irregularly irregular.  No significant murmurs are heard. Abdomen:  abdomen is soft. Msk:  no musculoskeletal deformities. Extremities:  no peripheral edema. Skin:  no skin rashes. Psych:  patient is oriented to person time and place.  Affect is normal.  She is here with her family  member.   Impression & Recommendations:  Problem # 1:  ATRIAL FIBRILLATION (ICD-427.31)  Her updated medication list for this problem includes:    Aspirin Ec 81 Mg Tbec (Aspirin) .Marland Kitchen... Take 1 tablet by mouth once a day    Metoprolol Tartrate 50 Mg Tabs (Metoprolol tartrate) .Marland Kitchen... 1/2 by mouth bid    Coumadin 2 Mg Tabs (Warfarin sodium) .Marland Kitchen... Take one tablet daily    Lanoxin 0.125 Mg Tabs (Digoxin) .Marland Kitchen... 1 by mouth once daily  Orders: EKG w/ Interpretation (93000) Patient has chronic atrial fibrillation.  Rate is controlled.  EKG is done today and reviewed by me.  She has atrial fib with a controlled rate.  There is no significant change.  She is on Coumadin.  There have been difficulties controlling her levels.  We talked about possible dabigatran therapy.  First she will check with her insurance company concerning the costs very up to this point it has been very expensive.  If we do transitioning her this will be done through the Coumadin clinic so that the proper dose to be checked.  In general 150 mg b.i.d. is used unless creatinine clearance is less than 30.  In this case 75 b.i.d.  Problem # 2:  HYPERTENSION (ICD-401.9)  Her updated medication list for this problem includes:    Aspirin Ec 81 Mg Tbec (Aspirin) .Marland Kitchen... Take 1 tablet by mouth once a day    Metoprolol Tartrate 50 Mg Tabs (Metoprolol tartrate) .Marland Kitchen... 1/2 by mouth bid Blood pressure is controlled today.  No change in therapy.  Problem # 3:  CEREBROVASCULAR ACCIDENT, HX OF (ICD-V12.50) Patient had a CVA in the past.  This is why it is so important that she be on either Coumadin or an equivalent.  No changes.  Patient Instructions: 1)  Dabigatran is a new alternative to Coumadin, you may want to just keep that in mind for the future 2)  Follow up in 1 year

## 2010-06-28 NOTE — Progress Notes (Signed)
Summary: LMOM 2/3  Phone Note Outgoing Call   Summary of Call: recent CT scan by  GI incidentally found  "Lumbar spondylosis with areas of central canal stenosis" the patient has complained about back pain in the past, please advise to have  office visit within the next couple of weeks  Jose E. Paz MD  June 30, 2009 5:23 PM   Follow-up for Phone Call        left message on machine for pt to return call Shary Decamp  July 01, 2009 11:24 AM     Additional Follow-up for Phone Call Additional follow up Details #2::    patient has an app ton Feb 3,2011...Marland KitchenMarland KitchenBarb Merino  July 01, 2009 3:18 PM  Follow-up by: Barb Merino,  July 01, 2009 3:18 PM

## 2010-06-28 NOTE — Assessment & Plan Note (Signed)
Summary: IBS...EM   History of Present Illness Visit Type: Follow-up Consult Primary GI MD: Elie Goody MD [facility] Primary Provider: Willow Ora, MD Requesting Provider: Willow Ora, M.D. Chief Complaint: Patient referred for "IBS." However, patient's only complaint is lower back pain upon standing and mild lower abdominal pain. She denies any change in bowels or blood in the stool.Virginia Price History of Present Illness:   This is an 77 year old female who has had mild lower abdominal pain that has been persistent for several months. She has severe back pain, worsening with standing and walking and relieved by rest. She has a history of irritable bowel syndrome in the past with alternating diarrhea and constipation. However, her bowel movements have been regular recently.  She had a suboptimal barium enema in October of 2009 that did not reveal any significant abnormalities except for diverticulosis. Prior colonoscopy in 2004 was not complete due to suspected abdominal adhesions.   GI Review of Systems    Reports abdominal pain and  heartburn.     Location of  Abdominal pain: lower abdomen.    Denies acid reflux, belching, bloating, chest pain, dysphagia with liquids, dysphagia with solids, loss of appetite, nausea, vomiting, vomiting blood, weight loss, and  weight gain.        Denies anal fissure, black tarry stools, change in bowel habit, constipation, diarrhea, diverticulosis, fecal incontinence, heme positive stool, hemorrhoids, irritable bowel syndrome, jaundice, light color stool, liver problems, rectal bleeding, and  rectal pain. Preventive Screening-Counseling & Management  Caffeine-Diet-Exercise     Does Patient Exercise: yes   Current Medications (verified): 1)  Aspirin Ec 81 Mg Tbec (Aspirin) .... Take 1 Tablet By Mouth Once A Day 2)  Bupropion Hcl 100 Mg Tabs (Bupropion Hcl) .... Take 1 Tablet By Mouth Twice A Day 3)  Metoprolol Tartrate 50 Mg  Tabs (Metoprolol Tartrate) .... 1/2 By  Mouth Bid 4)  Flonase 50 Mcg/act  Susp (Fluticasone Propionate) .... Take 2 Sprays in Each Nostril Once A Day 5)  Nitroquick 0.4 Mg  Subl (Nitroglycerin) .... As Needed 6)  Simvastatin 40 Mg  Tabs (Simvastatin) .Virginia Price.. 1 By Mouth Once Daily 7)  Multivitamins  Tabs (Multiple Vitamin) .... Take 1 Tablet By Mouth Once A Day 8)  Calcium & Vitamin D .... Take 1 Tablet By Mouth Once A Day 9)  Coumadin 2 Mg Tabs (Warfarin Sodium) .... Take One Tablet Daily 10)  Lanoxin 0.125 Mg Tabs (Digoxin) .Virginia Price.. 1 By Mouth Once Daily 11)  Loratadine 10 Mg Tabs (Loratadine) .... As Needed 12)  Famotidine 20 Mg Tabs (Famotidine) .... As Needed  Allergies (verified): 1)  ! Penicillin 2)  ! Sulfa 3)  ! Septra 4)  ! Flagyl 5)  ! Vicodin 6)  ! Tramadol Hcl 7)  Prednisone  Past History:  Past Medical History: CORONARY ARTERY DISEASE (ICD-414.00) ATRIAL FIBRILLATION (ICD-427.31....chronic ANTICOAGULATION THERAPY (ICD-V58.61) HYPERTENSION (ICD-401.9) HYPERLIPIDEMIA (ICD-272.4) IBS (ICD-564.1) UTI (ICD-599.0) BACK PAIN (ICD-724.5) PERSONAL HX COLONIC POLYPS (ICD-V12.72) MALIGNANT MELANOMA, SKIN (ICD-172.9) CARCINOMA, BASAL CELL, HX OF (ICD-V10.83) GAIT IMBALANCE (ICD-781.2) SHOULDER PAIN (ICD-719.41) OVERACTIVE BLADDER (ICD-596.51) MEMORY LOSS (ICD-780.93) GERD (ICD-530.81) h/o esophageal stricture PULMONARY NODULE, RIGHT MIDDLE LOBE (ICD-518.89)  /  CT...stable...sept, 2010 ALLERGIC RHINITIS (ICD-477.9) RESTLESS LEG SYNDROME (ICD-333.94) CEREBROVASCULAR ACCIDENT, HX OF (ICD-V12.50)  (10-03 L MCA territory)...2005 ? fibromyalgia Allergic rhinitis Diverticulosis h/o Adenomatous colon polyps  abnormal CT chest ("innumerable small nodules"), sees Dr Delton Coombes  Coronaries normal....cath..2004  /  nuclear 2006...no ischemia  Past Surgical History: Cholecystectomy-1991 Hysterectomy-1978 Cataract extraction-bilateral  Appendectomy Moh's surgery 11-06  Family History: Reviewed history from 12/21/2008 and  no changes required. Heart Disease: mother and father Diabetes: mother and father  Breast Ca--no  Colon Cancer-- no Colon Polyps:mother  Review of Systems       The patient complains of arthritis/joint pain, back pain, and sleeping problems.         The pertinent positives and negatives are noted as above and in the HPI. All other ROS were reviewed and were negative.   Vital Signs:  Patient profile:   77 year old female Height:      67 inches Weight:      148.50 pounds BMI:     23.34 BSA:     1.78 Pulse rate:   64 / minute Pulse rhythm:   irregular BP sitting:   126 / 74  (right arm)  Vitals Entered By: Hortense Ramal CMA Duncan Dull) (June 28, 2009 10:20 AM)  Physical Exam  General:  Well developed, well nourished, no acute distress. Head:  Normocephalic and atraumatic. Eyes:  PERRLA, no icterus. Mouth:  No deformity or lesions, dentition normal. Lungs:  Clear throughout to auscultation. Heart:  Regular rate and rhythm; no murmurs, rubs,  or bruits. Abdomen:  Soft and nondistended. No masses, hepatosplenomegaly or hernias noted. Normal bowel sounds. Minimal lower abdominal tenderness to deep palpation without rebound or guarding. Psych:  Alert and cooperative. Normal mood and affect.  Impression & Recommendations:  Problem # 1:  ABDOMINAL PAIN, LOWER (ICD-789.09) Ongoing lower abdominal pain. No features of irritable bowel syndrome at this time. Rule out referred pain from her back or her pain secondary to adhesions. Rule out occult abdominal or pelvic process. Consider flexible sigmoidoscopy if no cause of pain is found. Further evaluation and management of back pain per Dr. Drue Novel. Orders: CT Abdomen/Pelvis with Contrast (CT Abd/Pelvis w/con) TLB-BMP (Basic Metabolic Panel-BMET) (80048-METABOL) TLB-Hepatic/Liver Function Pnl (80076-HEPATIC) TLB-TSH (Thyroid Stimulating Hormone) (84443-TSH) TLB-CBC Platelet - w/Differential (85025-CBCD)  Problem # 2:  DIVERTICULOSIS-COLON  (ICD-562.10) Long-term high fiber diet with adequate fluid intake.  Problem # 3:  PERSONAL HX COLONIC POLYPS (ICD-V12.72) BE in 10/ 2009 was suboptimal, but did not reveal polyps. No plans for surveillance colonoscopy given age and prior history of an unsuccessful procedure.  Patient Instructions: 1)  Get your labs drawn today in the basement. 2)  You have been scheduled for a CT scan.  3)  Copy sent to : Willow Ora, MD 4)  The medication list was reviewed and reconciled.  All changed / newly prescribed medications were explained.  A complete medication list was provided to the patient / caregiver.

## 2010-06-28 NOTE — Progress Notes (Signed)
Summary: urine culture results  Phone Note Outgoing Call Call back at [facility] Phone [phone]   Details for Reason: URINE CULTURE: UCX inconclusive check on patient, if urinary symptoms resolved, no further treatment otherwise, let me know Signed by [facility] E. Jacquelina Hewins MD on 01/06/2009 at 2:23 PM Summary of Call: Patient will finish antibiotic today.  She is still having dysuria.  Symptoms are a "little" better Shary Decamp  January 07, 2009 4:29 PM   Follow-up for Phone Call        OV if no better by next week Hiyab Nhem E. Kala Gassmann MD  January 08, 2009 3:14 PM   [facility] for pt to return call Shary Decamp  January 11, 2009 1:20 PM [facility] for pt to return call Shary Decamp  January 13, 2009 1:42 PM  Patient is feeling better  -- no sxs Shary Decamp  January 14, 2009 11:44 AM

## 2010-06-28 NOTE — Assessment & Plan Note (Signed)
Summary: burning when urinating/cbs   Vital Signs:  Patient profile:   77 year old female Weight:      144.25 pounds Pulse rate:   98 / minute Pulse rhythm:   regular BP sitting:   136 / 82  (left arm) Cuff size:   regular  Vitals Entered By: Army Fossa CMA (January 17, 2010 10:49 AM) CC: C/o burning when urinating. Comments - onset saturday   History of Present Illness: here with her daughter Complaining of moderate dysuria for the last 3 days  ROS No fever, nausea, vomiting has low-back pain but that is chronic and at baseline Denies any abdominal discomfort   Current Medications (verified): 1)  Aspirin Ec 81 Mg Tbec (Aspirin) .... Take 1 Tablet By Mouth Once A Day 2)  Bupropion Hcl 100 Mg Tabs (Bupropion Hcl) .... Take 1 Tablet By Mouth Twice A Day 3)  Metoprolol Tartrate 50 Mg  Tabs (Metoprolol Tartrate) .... 1/2 By Mouth Bid 4)  Flonase 50 Mcg/act  Susp (Fluticasone Propionate) .... Take 2 Sprays in Each Nostril Once A Day 5)  Nitroquick 0.4 Mg  Subl (Nitroglycerin) .... As Needed 6)  Simvastatin 40 Mg  Tabs (Simvastatin) .Marland Kitchen.. 1 By Mouth Once Daily 7)  Multivitamins  Tabs (Multiple Vitamin) .... Take 1 Tablet By Mouth Once A Day 8)  Calcium & Vitamin D .... Take 1 Tablet By Mouth Once A Day 9)  Warfarin Sodium 2 Mg Tabs (Warfarin Sodium) .Marland Kitchen.. 1 Daily Except 1 1/2 On Tue and Fri 10)  Lanoxin 0.125 Mg Tabs (Digoxin) .Marland Kitchen.. 1 By Mouth Once Daily 11)  Famotidine 20 Mg Tabs (Famotidine) .... As Needed 12)  [place] Seat With Handles .... Dx 724.5, 781.2  Allergies: 1)  ! Penicillin 2)  ! Sulfa 3)  ! Septra 4)  ! Flagyl 5)  ! Vicodin 6)  ! Tramadol Hcl 7)  Prednisone  Past History:  Past Medical History: Reviewed history from 07/06/2009 and no changes required.  Atrial fibrillation (Dx 10-03 per Event monitor) Coronary artery disease Hyperlipidemia Hypertension Cerebrovascular accident, hx  (10-03 L MCA territory) Allergic rhinitis GERD, h/o esophageal  stricture ? fibromyalgia Moh's surgery 11-06 Allergic rhinitis Diverticulosis h/o Adenomatous colon polyps abnormal CT chest ("innumerable small nodules"), sees Dr Delton Coombes   Past Surgical History: Reviewed history from 06/28/2009 and no changes required. Cholecystectomy-1991 Hysterectomy-1978 Cataract extraction-bilateral Appendectomy Moh's surgery 11-06  Social History: Reviewed history from 03/09/2008 and no changes required. lives by self still drives [employer] independent Widow neighbor very close to her [name]) 3 kids Patient has never smoked.  Alcohol Use - no Daily Caffeine Use Illicit Drug Use - no Patient gets regular exercise.  Physical Exam  General:  alert and well-developed.   Abdomen:  soft, normal bowel sounds, no distention, no masses, no guarding, and no rigidity.  slightly tender at the left lower quadrant. No CVA tenderness Extremities:  no lower extremity edema   Impression & Recommendations:  Problem # 1:  UTI (ICD-599.0)  symptoms consistent with a UTI sending a UCX as soon as we can (she was  unable to give [place] a sample large enough to be culture) patient on coumadin, multiple Abx allergies, tolerates cephalosporins ok: Rx cefprozil    Orders: UA Dipstick w/o Micro (manual) (81017) Specimen Handling (99000) T-Culture, Urine (51025-85277) T-Urine Microscopic (82423-53614)  Her updated medication list for this problem includes:    Cefprozil 500 Mg Tabs (Cefprozil) ..... One by mouth twice a day  Complete Medication List: 1)  Aspirin Ec 81 Mg Tbec (Aspirin) .... Take 1 tablet by mouth once a day 2)  Bupropion Hcl 100 Mg Tabs (Bupropion hcl) .... Take 1 tablet by mouth twice a day 3)  Metoprolol Tartrate 50 Mg Tabs (Metoprolol tartrate) .... 1/2 by mouth bid 4)  Flonase 50 Mcg/act Susp (Fluticasone propionate) .... Take 2 sprays in each nostril once a day 5)  Nitroquick 0.4 Mg Subl (Nitroglycerin) .... As needed 6)  Simvastatin 40 Mg Tabs  (Simvastatin) .Marland Kitchen.. 1 by mouth once daily 7)  Multivitamins Tabs (Multiple vitamin) .... Take 1 tablet by mouth once a day 8)  Calcium & Vitamin D  .... Take 1 tablet by mouth once a day 9)  Warfarin Sodium 2 Mg Tabs (Warfarin sodium) .Marland Kitchen.. 1 daily except 1 1/2 on tue and fri 10)  Lanoxin 0.125 Mg Tabs (Digoxin) .Marland Kitchen.. 1 by mouth once daily 11)  Famotidine 20 Mg Tabs (Famotidine) .... As needed 12)  [place] Seat With Handles  .... Dx 724.5, 781.2 13)  Cefprozil 500 Mg Tabs (Cefprozil) .... One by mouth twice a day  Other Orders: UA Dipstick w/o Micro (automated)  (81003)  Patient Instructions: 1)  bring the urine sample soon as you can 2)  Drink plenty fluids 3)  Call if you get  worse or if you have fever 4)  take antibiotics as prescribed 5)  You are due for another Coumadin check in approximately 2 weeks. 6)  You are due for a general checkup Prescriptions: CEFPROZIL 500 MG TABS (CEFPROZIL) One by mouth twice a day  #10 x 0   Entered and Authorized by:   Nolon Rod. Paz MD   Signed by:   Nolon Rod. Paz MD on 01/17/2010   Method used:   Electronically to        [facility]* (retail)       [address]       [place], [place]  [address]       Ph: [phone]       Fax: [phone]   RxID:   [id number] CEFPROZIL 500 MG TABS (CEFPROZIL) One by mouth plus a day  #10 x 0   Entered and Authorized by:   Nolon Rod. Paz MD   Signed by:   Nolon Rod. Paz MD on 01/17/2010   Method used:   Electronically to        [facility]* (retail)       [address]       [place], [place]  [address]       Ph: [phone]       Fax: [phone]   RxID:   [id number]   Laboratory Results   Urine Tests    Routine Urinalysis   Color: straw Appearance: Hazy Glucose: negative   (Normal Range: Negative) Bilirubin: moderate   (Normal Range: Negative) Ketone: negative   (Normal Range: Negative) Spec. Gravity: 1.020   (Normal Range:  1.003-1.035) Blood: large   (Normal Range: Negative) pH: 6.0   (Normal Range: 5.0-8.0) Protein: negative   (Normal Range: Negative) Urobilinogen: 0.2   (Normal Range: 0-1) Nitrite: negative   (Normal Range: Negative) Leukocyte Esterace: negative   (Normal Range: Negative)    Comments: Army Fossa CMA  January 17, 2010 10:52 AM Not enough urine to culture.

## 2010-06-28 NOTE — Progress Notes (Signed)
Summary: LANOXIN, BUPROPION REFILLS  Phone Note Refill Request Message from:  Fax from Pharmacy on February 22, 2010 11:40 AM  Refills Requested: Medication #1:  LANOXIN 0.125 MG TABS 1 by mouth once daily  Medication #2:  BUPROPION HCL 100 MG TABS Take 1 tablet by mouth twice a day   Last Refilled: 12/30/2009   Notes: NOTE ON FAX--REQUEST FOR NEW RX [facility] [address], [place], [place]     FAX [phone]          Initial call taken by: Jerolyn Shin,  February 22, 2010 12:03 PM    Prescriptions: LANOXIN 0.125 MG TABS (DIGOXIN) 1 by mouth once daily  #30 Tablet x 1   Entered by:   Army Fossa CMA   Authorized by:   Nolon Rod. Paz MD   Signed by:   Army Fossa CMA on 02/22/2010   Method used:   [facility]        [facility]  [address] [phone][facility] (retail)       [address]       [place], [place]  [address]       Ph: [phone] or [phone]       Fax: [phone]   RxID:   [id number] BUPROPION HCL 100 MG TABS (BUPROPION HCL) Take 1 tablet by mouth twice a day  #60 Tablet x 2   Entered by:   Army Fossa CMA   Authorized by:   Nolon Rod. Paz MD   Signed by:   Army Fossa CMA on 02/22/2010   Method used:   [facility]        [facility]  [address] [phone][facility] ([facility]ail)       [address]       [place], [place]  [address]       Ph: [phone] or [phone]       Fax: [phone]   RxID:   [id number]

## 2010-06-28 NOTE — Miscellaneous (Signed)
  Clinical Lists Changes  Problems: Removed problem of CORONARY ARTERY DISEASE (ICD-414.00) Observations: Added new observation of PAST MED HX: CORONARY ARTERY DISEASE (ICD-414.00) ATRIAL FIBRILLATION (ICD-427.31....chronic Coumadin RX ANTICOAGULATION THERAPY (ICD-V58.61) HYPERTENSION (ICD-401.9) HYPERLIPIDEMIA (ICD-272.4) IBS (ICD-564.1) DYSURIA (ICD-788.1) UTI (ICD-599.0) BACK PAIN (ICD-724.5) FECAL OCCULT BLOOD (ICD-792.1) PERSONAL HX COLONIC POLYPS (ICD-V12.72) MALIGNANT MELANOMA, SKIN (ICD-172.9) CARCINOMA, BASAL CELL, HX OF (ICD-V10.83) GAIT IMBALANCE (ICD-781.2) SHOULDER PAIN (ICD-719.41) OVERACTIVE BLADDER (ICD-596.51) MEMORY LOSS (ICD-780.93) GERD (ICD-530.81) h/o esophageal stricture PULMONARY NODULE, RIGHT MIDDLE LOBE (ICD-518.89)  /  CT...stable...sept, 2010 ALLERGIC RHINITIS (ICD-477.9) RESTLESS LEG SYNDROME (ICD-333.94) HRT (ICD-V07.4) CEREBROVASCULAR ACCIDENT, HX OF (ICD-V12.50)  (10-03 L MCA territory)...2005 CHOLECYSTECTOMY, HX OF (ICD-V45.79) HYSTERECTOMY, HX OF (ICD-V45.77)   ? fibromyalgia Moh's surgery 11-06 Allergic rhinitis Diverticulosis h/o Adenomatous colon polyps abnormal CT chest ("innumerable small nodules"), sees Dr Delton Coombes  Coronaries normal....cath..2004  /  nuclear 2006...no ischemia (06/03/2009 19:17) Added new observation of PRIMARY MD: Willow Ora, M.D.  (06/03/2009 19:17)       Past History:  Past Medical History: CORONARY ARTERY DISEASE (ICD-414.00) ATRIAL FIBRILLATION (ICD-427.31....chronic Coumadin RX ANTICOAGULATION THERAPY (ICD-V58.61) HYPERTENSION (ICD-401.9) HYPERLIPIDEMIA (ICD-272.4) IBS (ICD-564.1) DYSURIA (ICD-788.1) UTI (ICD-599.0) BACK PAIN (ICD-724.5) FECAL OCCULT BLOOD (ICD-792.1) PERSONAL HX COLONIC POLYPS (ICD-V12.72) MALIGNANT MELANOMA, SKIN (ICD-172.9) CARCINOMA, BASAL CELL, HX OF (ICD-V10.83) GAIT IMBALANCE (ICD-781.2) SHOULDER PAIN (ICD-719.41) OVERACTIVE BLADDER (ICD-596.51) MEMORY LOSS  (ICD-780.93) GERD (ICD-530.81) h/o esophageal stricture PULMONARY NODULE, RIGHT MIDDLE LOBE (ICD-518.89)  /  CT...stable...sept, 2010 ALLERGIC RHINITIS (ICD-477.9) RESTLESS LEG SYNDROME (ICD-333.94) HRT (ICD-V07.4) CEREBROVASCULAR ACCIDENT, HX OF (ICD-V12.50)  (10-03 L MCA territory)...2005 CHOLECYSTECTOMY, HX OF (ICD-V45.79) HYSTERECTOMY, HX OF (ICD-V45.77)   ? fibromyalgia Moh's surgery 11-06 Allergic rhinitis Diverticulosis h/o Adenomatous colon polyps abnormal CT chest ("innumerable small nodules"), sees Dr Delton Coombes  Coronaries normal....cath..2004  /  nuclear 2006...no ischemia

## 2010-06-28 NOTE — Progress Notes (Signed)
Summary: Refill Request  Phone Note Refill Request Message from:  Patient on January 26, 2010 9:52 AM  Refills Requested: Medication #1:  WARFARIN SODIUM 2 MG TABS 1 DAILY EXCEPT 1 1/2 ON TUE AND FRI   Dosage confirmed as above?Dosage Confirmed   [facility] [place]  Next Appointment Scheduled: 9.6.11 Initial call taken by: Harold Barban,  January 26, 2010 9:52 AM    Prescriptions: WARFARIN SODIUM 2 MG TABS (WARFARIN SODIUM) 1 DAILY EXCEPT 1 1/2 ON TUE AND FRI  #30 x 3   Entered by:   Army Fossa CMA   Authorized by:   Nolon Rod. Paz MD   Signed by:   Army Fossa CMA on 01/26/2010   Method used:   [facility]        [facility]  [address] [id number]       [address] [place] [address]       [place], [place]  [address]       Ph: [phone] or [phone]       Fax: [phone]   RxID:   [id number]

## 2010-06-28 NOTE — Progress Notes (Signed)
Summary: med reaction?   Phone Note Call from Patient Call back at [phone]   Caller: Patient Summary of Call: Pt here she states that the ATB that she was put on Cefprozil she said the pharmacy said that it had PCN in it and she allergic to PCN. She states the only problem she is having is dizziness. Please advise if you would like to switch medications. Army Fossa CMA  January 18, 2010 1:33 PM   Follow-up for Phone Call        we have prescribed this medication before without apparent problems, only a minority of patients that are allergic to penicillin are allergic to  Cefprozil . Recommend to go ahead and try it unless  she had a reaction that I'm not aware of Follow-up by: Jose E. Paz MD,  January 18, 2010 2:22 PM  Additional Follow-up for Phone Call Additional follow up Details #1::        Pt is aware she will call if she has an reaction. Army Fossa CMA  January 18, 2010 2:50 PM

## 2010-06-28 NOTE — Assessment & Plan Note (Signed)
Summary: PT CHECK--GOING OUT OF TOWN REST OF WEEK///SPH   Nurse Visit   Vital Signs:  Patient profile:   77 year old female Height:      67 inches Weight:      152.4 pounds BP sitting:   100 / 64  Vitals Entered By: Shary Decamp (August 02, 2009 9:54 AM)  Allergies: 1)  ! Penicillin 2)  ! Sulfa 3)  ! Septra 4)  ! Flagyl 5)  ! Vicodin 6)  ! Tramadol Hcl 7)  Prednisone Laboratory Results   Blood Tests      INR: 3.5   (Normal Range: 0.88-1.12   Therap INR: 2.0-3.5) Comments: PREVIOUS INR: 4.1 (07/16/09) on 19mg /wk CURRENT DOSE: 2mg  TABLETS Held x 2 days then 1 by mouth once daily except 1 1/2 on m,w,v (17mg /wk) Shary Decamp  August 02, 2009 9:55 AM  NEW DOSE: 2 MG TABLETS 1 tablet daily except 1 1/2 tablet on tues & friday (16mg /wk) recheck in 2 weeks Shary Decamp  August 02, 2009 10:07 AM       Orders Added: 1)  Est. Patient Level I [[address]] 2)  Protime [[id number]]

## 2010-06-28 NOTE — Progress Notes (Signed)
Summary: digoxin refill  Phone Note Refill Request Message from:  Fax from Pharmacy on May 02, 2010 4:01 PM  Refills Requested: Medication #1:  LANOXIN 0.125 MG TABS 1 by mouth once [facility]  [address] [place]  ph [phone], fax [phone]   qty 30  Next Appointment Scheduled: Tues 06/14/2010 Initial call taken by: Jerolyn Shin,  May 02, 2010 4:12 PM    Prescriptions: LANOXIN 0.125 MG TABS (DIGOXIN) 1 by mouth once daily  #30 Tablet x 2   Entered by:   Army Fossa CMA   Authorized by:   Nolon Rod. Paz MD   Signed by:   Army Fossa CMA on 05/02/2010   Method used:   [facility]        [facility]  [address] [phone][facility]       [address]       [place], [place]  [address]       Ph: [phone] or [phone]       Fax: [phone]   RxID:   [id number]

## 2010-06-28 NOTE — Assessment & Plan Note (Signed)
Summary: pt//lch   Nurse Visit   Vital Signs:  Patient profile:   77 year old female Height:      67 inches Weight:      147 pounds Temp:     98.1 degrees F oral Pulse rate:   66 / minute BP sitting:   100 / 58  (left arm)  Vitals Entered By: Jeremy Johann CMA (October 29, 2009 1:39 PM) CC: pt check Comments REVIEWED MED LIST, PATIENT AGREED DOSE AND INSTRUCTION CORRECT    Allergies: 1)  ! Penicillin 2)  ! Sulfa 3)  ! Septra 4)  ! Flagyl 5)  ! Vicodin 6)  ! Tramadol Hcl 7)  Prednisone Laboratory Results   Blood Tests    Date/Time Reported: October 29, 2009 1:40 PM   INR: 2.6   (Normal Range: 0.88-1.12   Therap INR: 2.0-3.5) Comments: current dose 2mg  tab 1 tab daily except  1 1/2 tab on tues and fri......Marland KitchenFelecia Deloach CMA  October 29, 2009 1:41 PM  no change , recheck in 4 weeks Jose E. Paz MD  October 29, 2009 2:30 PM  pt aware.Marti Sleigh Deloach CMA  October 29, 2009 4:43 PM     Orders Added: 1)  Est. Patient Level I [78469] 2)  Protime [[id number]]

## 2010-06-28 NOTE — Progress Notes (Signed)
Summary: pain patch request  Phone Note Call from Patient Call back at [facility] Phone [phone] Call back at [phone]   Summary of Call: While in office for PT check the patient notes that she has been having continuous trouble with back pain. Her neighbor has made her aware that her mother uses a Lidocaine patch for her back pain. Patient would like to know if she can receive this prescription. Please advise. Initial call taken by: Lucious Groves CMA,  March 30, 2010 10:46 AM  Follow-up for Phone Call        okay to try Lidoderm patches. apply 1 or  2 patches at the area of the pain. Remove the patches after 12 hours. Call one month supply and one refill Follow-up by: Jose E. Paz MD,  March 30, 2010 3:29 PM  Additional Follow-up for Phone Call Additional follow up Details #1::        Patient notified and uses [facility]. Additional Follow-up by: Lucious Groves CMA,  March 30, 2010 4:07 PM    New/Updated Medications: LIDODERM 5 % PTCH (LIDOCAINE) Apply 1 or 2 patches to area of pain and remove after 12 hours Prescriptions: LIDODERM 5 % PTCH (LIDOCAINE) Apply 1 or 2 patches to area of pain and remove after 12 hours  #30 x 1   Entered by:   Lucious Groves CMA   Authorized by:   Nolon Rod. Paz MD   Signed by:   Lucious Groves CMA on 03/30/2010   Method used:   Printed then faxed to ...       [facility]* (retail)       [address]       [place], [place]  [address]       Ph: [phone]       Fax: [phone]   RxID:   [id number]

## 2010-06-28 NOTE — Assessment & Plan Note (Signed)
Summary: PT CHECK/KDC   Nurse Visit   Vital Signs:  Patient profile:   77 year old female Weight:      149.38 pounds Pulse rate:   60 / minute BP sitting:   110 / 60  Vitals Entered By: Kandice Hams (June 16, 2009 1:53 PM) CC: pt ck   Allergies: 1)  ! Penicillin 2)  ! Sulfa 3)  ! Septra 4)  ! Flagyl 5)  ! Vicodin 6)  ! Tramadol Hcl 7)  Prednisone Laboratory Results   Blood Tests     PT: 18.5 s   (Normal Range: 10.6-13.4)  INR: 2.3   (Normal Range: 0.88-1.12   Therap INR: 2.0-3.5) Comments: pt taking 2mg  1 1/2 once daily except, 1 on tue and friday .Kandice Hams  June 16, 2009 1:55 PM same dose  2 weeks Jose E. Paz MD  June 17, 2009 9:11 AM   PT INFORMED .Kandice Hams  June 17, 2009 9:58 AM     Orders Added: 1)  Est. Patient Level I [99211] 2)  Protime [16[address]UE]

## 2010-06-30 NOTE — Miscellaneous (Signed)
Summary: Orders Update  Clinical Lists Changes  Orders: Added new Test order of Carotid Duplex (Carotid Duplex) - Signed 

## 2010-06-30 NOTE — Assessment & Plan Note (Signed)
Summary: DIZZINESS/KB   Vital Signs:  Patient profile:   77 year old female Weight:      149.13 pounds Pulse rate:   84 / minute Pulse rhythm:   regular BP sitting:   118 / 60  (left arm) Cuff size:   regular  Vitals Entered By: Army Fossa CMA (June 02, 2010 10:51 AM) CC: Pt here to disucss dizziness, states her BP was low yesterday.  Comments [facility] [address]  on an ATB unsure of name not fasting    History of Present Illness: acute visit for eval of dizziness for the last 3 mornings, she woke up extremely dizzy. Described the symptoms as spinning, lasted few minutes, resolved spontaneously after she forced herself out of bed and tried to walk. There was no associated nausea, diplopia, slurred speech or motor deficits. The patient is not sure if moving her head would have made a symptoms worse. She did feel worse when she stands up but again she forced herself to walk.  Review of systems Denies chest pain or palpitations No headaches or stiff neck She's not taking any new  medicines Does not check her BPs at home but yesterday reportedly her systolic BP was 90. A month ago during her Coumadin check her systolic BP was 100. BP today is within normal. (-) N-V-D  Allergies: 1)  ! Penicillin 2)  ! Sulfa 3)  ! Septra 4)  ! Flagyl 5)  ! Vicodin 6)  ! Tramadol Hcl 7)  Prednisone  Past History:  Past Medical History: Reviewed history from 02/01/2010 and no changes required.  Atrial fibrillation (Dx 10-03 per Event monitor) Coronary artery disease Hyperlipidemia Hypertension Cerebrovascular accident, hx  (10-03 L MCA territory) Allergic rhinitis GERD, h/o esophageal stricture ? fibromyalgia Moh's surgery 11-06 Allergic rhinitis Diverticulosis h/o Adenomatous colon polyps abnormal CT chest ("innumerable small nodules"), sees Dr Delton Coombes   Past Surgical History: Reviewed history from 06/28/2009 and no changes  required. Cholecystectomy-1991 Hysterectomy-1978 Cataract extraction-bilateral Appendectomy Moh's surgery 11-06  Social History: Reviewed history from 02/01/2010 and no changes required. lives by self still drives [employer] independent Widow neighbor very close to her [name]) 3 kids Patient has never smoked.  Alcohol Use - no Daily Caffeine Use    Physical Exam  General:  alert, well-developed, and well-nourished.   Neck:  full range of motion. Good carotid pulses Lungs:  normal respiratory effort, no intercostal retractions, no accessory muscle use, and normal breath sounds.   Heart:  irreg Neurologic:  alert & oriented X3, cranial nerves II-XII intact, strength normal in all extremities,   gait normal for age, DTRs symmetricaly decreased  except at the knees where they are normal. Speech is clear Memory normal Romberg present? Got slightly unsteady when she closed her eyes    Impression & Recommendations:  Problem # 1:  DIZZINESS (ICD-780.4) Assessment New  new onset of dizziness in a patient with multiple cardiovascular risk factors and on on Coumadin for atrial fibrillation DDX includes TIAs, arrhythmias, peripheral dizzines etc Neurological exam today is actually within normal The office and hospital charts were checked, she had  no recent carotid ultrasound, ECHOs or brain imaging  hemoglobin today is normal Her atrial fibrillation has good rate control by physical exam Her BP has been slightly low. see instructions  Plan MRI Carotid ultrasound Echocardiogram If symptoms severe---->  ER Consider neurological referral, holter monitor , cards rferal, etc (although will see cards 06-14-10) patient verbalized understanding   Orders: Hgb (56433) Cardiology Referral (  Cardiology) Cardiology Referral (Cardiology) Radiology Referral (Radiology)  Complete Medication List: 1)  Aspirin Ec 81 Mg Tbec (Aspirin) .... Take 1 tablet by mouth once a day 2)  Bupropion Hcl  100 Mg Tabs (Bupropion hcl) .... Take 1 tablet by mouth twice a day 3)  Metoprolol Tartrate 50 Mg Tabs (Metoprolol tartrate) .... 1/2 by mouth bid 4)  Flonase 50 Mcg/act Susp (Fluticasone propionate) .... Take 2 sprays in each nostril once a day 5)  Nitroquick 0.4 Mg Subl (Nitroglycerin) .... As needed 6)  Simvastatin 40 Mg Tabs (Simvastatin) .Marland Kitchen.. 1 by mouth daily at bedtime. 7)  Multivitamins Tabs (Multiple vitamin) .... Take 1 tablet by mouth once a day 8)  Calcium & Vitamin D  .... Take 1 tablet by mouth once a day 9)  Warfarin Sodium 2 Mg Tabs (Warfarin sodium) .Marland Kitchen.. 1 daily except 1 1/2 on tue and fri 10)  Lanoxin 0.125 Mg Tabs (Digoxin) .Marland Kitchen.. 1 by mouth once daily 11)  Famotidine 20 Mg Tabs (Famotidine) .... As needed 12)  [place] Seat With Handles  .... Dx 724.5, 781.2 13)  Lidoderm 5 % Ptch (Lidocaine) .... Apply 1 or 2 patches to area of pain and remove after 12 hours  Patient Instructions: 1)  call if symptoms severe or more frecuent 2)  Check your blood pressure  daily. If it is more than 140/85 or less than 105/70 consistently,please let us know  3)  Please schedule a follow-up appointment in 1 month.    Orders Added: 1)  Hgb [85018] 2)  Cardiology Referral [Cardiology] 3)  Cardiology Referral [Cardiology] 4)  Radiology Referral [Radiology] 5)  Est. Patient Level IV [99214]    Laboratory Results   CBC   HGB:  12.7 g/dL   (Normal Range: 40.9-81.1 in Males, 12.0-15.0 in Females)

## 2010-06-30 NOTE — Progress Notes (Signed)
Summary: MRI now approved  Phone Note Outgoing Call   Summary of Call: FYI--I called patient and notified that we cannot get MRI b/c insurance wants the patient to have anti-dizziness med for one week and hearing exam. Patient notes her last hearing exam was years ago and is aware we can not use that to try and get approval. Patient expressed understanding and will not do MRI if insurance will not cover. Initial call taken by: Lucious Groves CMA,  June 13, 2010 10:24 AM  Follow-up for Phone Call        get a CT head w/o arrange a Neurology referal Jose E. Paz MD  June 13, 2010 12:16 PM   Additional Follow-up for Phone Call Additional follow up Details #1::        Dr. Drue Novel, I just called & spoke with Insurance company & their Physician reviewer has recommended and will approve an MRI only of the Brain with & w/o contrast.  They have already given me the approval number, so I scheduled the patient.  Cancel CT request & proceed with Neurology referral?  We do not have a recent BUN & CREAT on the patient, we need her to come in for those labs asap. Magdalen Spatz [facility]  June 13, 2010 2:12 PM     Additional Follow-up for Phone Call Additional follow up Details #2::    please arrange [facility] E. Paz MD  June 13, 2010 3:05 PM   Awaiting call back from patient, to schedule for labs prior to MRI. Magdalen Spatz [facility]  June 14, 2010 8:50 AM  I left 2nd message to call back Magdalen Spatz [facility]  June 14, 2010 11:32 AM  Left 3rd mess to call back Magdalen Spatz [facility]  June 14, 2010 2:07 PM     Additional Follow-up for Phone Call Additional follow up Details #3:: Details for Additional Follow-up Action Taken: I SPOKE WITH PATIENT.  SHE IS GOING TO [facility] ON 06-15-2010 FOR LABS & IS AWARE OF HER MRI SCHEDULED FOR FRIDAY, 06-17-2010. Magdalen Spatz [facility]  June 14, 2010 3:43 PM

## 2010-06-30 NOTE — Progress Notes (Signed)
Summary: MRI BRAIN DENIED BY INSURANCE  Phone Note Other Incoming   Summary of Call: IN REFERENCE TO MRI BRAIN REFERRAL........[employer], **REQUEST DENIED**, AFTER CASE WAS FORWARDED TO THEIR PHYSICIAN REVIEW DEPT, AND EVEN DR. Telisha Zawadzki'S CLINICAL NOTES THAT I FAXED FOR REVIEW.  THEY WILL NOTIFY BY FAX DETAILS FOR Dalene Seltzer [facility]  June 07, 2010 11:30 AM  Initial call taken by: Magdalen Spatz [facility],  June 07, 2010 11:30 AM  Follow-up for Phone Call        advise patient: --I encourage her to call her insurance company and ask  for an explanation --she can always go ahead and get the MRI ot of pocket Barnard Sharps E. Esli Jernigan MD  June 08, 2010 5:41 PM   Additional Follow-up for Phone Call Additional follow up Details #1::        I called and spoke with patient, advised her of all above, patient agreed she will contact her insurance company. Magdalen Spatz [facility]  June 09, 2010 11:20 AM

## 2010-06-30 NOTE — Progress Notes (Signed)
Summary: simvastatin, warfarin  refill  Phone Note Refill Request Message from:  Fax from Pharmacy on June 17, 2010 12:51 PM  Refills Requested: Medication #1:  SIMVASTATIN 40 MG TABS 1 by mouth daily at bedtime.  Medication #2:  WARFARIN SODIUM 2 MG TABS 1 DAILY EXCEPT 1 1/2 ON TUE AND FRI [id number] [address], [place], [place]    phone-[phone],  Fax - [phone]   qty = 30 for each  Next Appointment Scheduled: none Initial call taken by: Jerolyn Shin,  June 17, 2010 12:51 PM    Prescriptions: SIMVASTATIN 40 MG TABS (SIMVASTATIN) 1 by mouth daily at bedtime.  #30 x 2   Entered by:   Army Fossa CMA   Authorized by:   Nolon Rod. Paz MD   Signed by:   Army Fossa CMA on 06/17/2010   Method used:   [facility]ronically [facility]        [facility]  [address] [phone][facility]retail)       [address]       [place], [place]  [address]       Ph: [phone] or [phone]       Fax: [phone]   RxID:   [id number] WARFARIN SODIUM 2 MG TABS (WARFARIN SODIUM) 1 DAILY EXCEPT 1 1/2 ON TUE AND FRI  #30 x 3   Entered by:   Army Fossa CMA   Authorized by:   Nolon Rod. Paz MD   Signed by:   Army Fossa CMA on 06/17/2010   Method used:   [facility]        [facility]  [address] [phone][facility] (retail)       [address]       [place], [place]  [address]       Ph: [phone] or [phone]       Fax: [phone]   RxID:   [id number]

## 2010-06-30 NOTE — Assessment & Plan Note (Signed)
Summary: f1y  Medications Added BUPROPION HCL 100 MG TABS (BUPROPION HCL) Take 1 tablet by mouth once daily METOPROLOL TARTRATE 50 MG  TABS (METOPROLOL TARTRATE) 1/2 tab two times a day NITROLINGUAL 0.4 MG/SPRAY SOLN (NITROGLYCERIN) One spray under tongue every 5 minutes as needed for chest pain---may repeat times three LANOXIN 0.125 MG TABS (DIGOXIN) 1 by mouth once daily  HOLD LIDODERM 5 % PTCH (LIDOCAINE) Apply 1 or 2 patches to area of pain and remove after 12 hours as needed MECLIZINE HCL 12.5 MG TABS (MECLIZINE HCL) Take 1 tablet by mouth two times a day      Allergies Added:   Visit Type:  Follow-up Primary Provider:  Willow Ora, MD  CC:  dizziness.  History of Present Illness: The patient is seen today for follow up of atrial fibrillation.  She is also seeing her the followups of significant dizziness.  She has been assessed carefully by Dr. Drue Novel.  There is question that she had some decreased blood pressure with one of her episodes.  Orthostatic blood pressure was checked carefully today.  In the lying position her pressure was 158/74.  In the sitting position her blood pressure was 149/83.  With this motion she had marked dizziness and almost fell over backwards while being watched very carefully by her nursing team.  This stabilized and then as she stood her blood pressure was 140/74 without the dizziness.  Pulse rate increased from 63-76.  As she remained standing her blood pressure remained stable at 150/76 with a pulse of 77.  She does have atrial fibrillation.  Two-dimensional echo had been ordered and was done June 08, 2010.  This revealed an ejection fraction 55-60% with no regional wall motion abnormalities.  There was mild-to-moderate mitral regurgitation.  There was no significant aortic stenosis.  Carotid Doppler was done June 08, 2010.  There was antegrade flow of the vertebral arteries.  There was 0-39% bilateral internal carotid artery stenoses.  The patient is very  limited by his current symptom.  She does mention some problems with her left ear.  Current Medications (verified): 1)  Aspirin Ec 81 Mg Tbec (Aspirin) .... Take 1 Tablet By Mouth Once A Day 2)  Bupropion Hcl 100 Mg Tabs (Bupropion Hcl) .... Take 1 Tablet By Mouth Once Daily 3)  Metoprolol Tartrate 50 Mg  Tabs (Metoprolol Tartrate) .... 1/2 Tab Two Times A Day 4)  Flonase 50 Mcg/act  Susp (Fluticasone Propionate) .... Take 2 Sprays in Each Nostril Once A Day 5)  Nitrolingual 0.4 Mg/spray Soln (Nitroglycerin) .... One Spray Under Tongue Every 5 Minutes As Needed For Chest Pain---May Repeat Times Three 6)  Simvastatin 40 Mg Tabs (Simvastatin) .Marland Kitchen.. 1 By Mouth Daily At Bedtime. 7)  Multivitamins  Tabs (Multiple Vitamin) .... Take 1 Tablet By Mouth Once A Day 8)  Calcium & Vitamin D .... Take 1 Tablet By Mouth Once A Day 9)  Warfarin Sodium 2 Mg Tabs (Warfarin Sodium) .Marland Kitchen.. 1 Daily Except 1 1/2 On Tue and Fri 10)  Lanoxin 0.125 Mg Tabs (Digoxin) .Marland Kitchen.. 1 By Mouth Once Daily 11)  Famotidine 20 Mg Tabs (Famotidine) .... As Needed 12)  Lidoderm 5 % Ptch (Lidocaine) .... Apply 1 or 2 Patches To Area of Pain and Remove After 12 Hours As Needed  Allergies (verified): 1)  ! Penicillin 2)  ! Sulfa 3)  ! Septra 4)  ! Flagyl 5)  ! Vicodin 6)  ! Tramadol Hcl 7)  Prednisone  Past History:  Past  Medical History:  Atrial fibrillation (Dx 10-03 per Event monitor) Coronary artery disease Hyperlipidemia Hypertension Cerebrovascular accident, hx  (10-03 L MCA territory) Allergic rhinitis GERD, h/o esophageal stricture ? fibromyalgia Moh's surgery 11-06 Allergic rhinitis Diverticulosis h/o Adenomatous colon polyps abnormal CT chest ("innumerable small nodules"), sees Dr Delton Coombes  Dizziness    severe... January, 2012 Coumadin therapy  Review of Systems       Patient denies fever, chills, headache, sweats, rash, change in vision, chest pain, cough, nausea vomiting, urinary symptoms.  All other systems  are reviewed and are negative.  Vital Signs:  Patient profile:   77 year old female Height:      67 inches Weight:      149 pounds BMI:     23.42 Pulse rate:   62 / minute Pulse (ortho):   71 / minute BP standing:   140 / 74 Cuff size:   regular  Vitals Entered By: Hardin Negus, RMA (June 14, 2010 9:50 AM)  Serial Vital Signs/Assessments:  Time      Position  BP       Pulse  Resp  Temp     By           Lying RA  158/74   [address] RMA           Sitting   149/83   76                    Hardin Negus, RMA           Standing  140/74   71                    Hardin Negus, RMA  Comments: 2 mins -- BP 155/71  P 65 4 mins -- BP 150/76  P 77 sitting up almost fell back (had to catch her) started wearing off but dizzy thru the test By: Hardin Negus, RMA    Physical Exam  General:  patient is stable today other than when she tried to sit up on the exam table. Head:  head is atraumatic. Eyes:  no xanthelasma. Neck:  no jugular venous distention. Chest Wall:  no chest wall tenderness. Lungs:  lungs are clear.  Respiratory effort is nonlabored. Heart:  cardiac exam reveals S1-S2.  Rhythm is irregularly irregular.  There is a soft systolic murmur. Abdomen:  abdomen is soft. Msk:  no musculoskeletal deformities. Extremities:  no peripheral edema. Skin:  no skin rashes. Psych:  patient is oriented to person time and place.  Affect is normal.  She is here with her daughter.   Impression & Recommendations:  Problem # 1:  DIZZINESS (ICD-780.4) I am very concerned about her marked dizziness.  As outlined in the history of present illness, her blood pressure did drop initially when she went from lying to sitting.  The drop was from 158-149.  It was during this time that she had marked dizziness.  Her pressure remained in the 140 range and she felt better.  I feel this is not compatible with orthostatic hypotension.  She does watch her salt intake  because a history of hypertension.  I considered but decided not to change her salt intake.  I also decided not to change any of her other medicines related to blood pressure.  She is on only a very small dose of  metoprolol.  I've chosen to stop her Lanoxin for the slim possibility that some bradycardia is playing a role.  However I feel this is very unlikely to be the major issue.  I have reviewed the dimensional echo report and the carotid Doppler report.  Meclizine is to be started for the possibility of vertigo.  I agree with the recommendation that she have an MRI to assess her head and her ears.  It is unfortunate that the insurance company continues to deny this.  We need this for a complete evaluation.  Further evaluation by neurology may also be needed.  Problem # 2:  ATRIAL FIBRILLATION (ICD-427.31)  Her updated medication list for this problem includes:    Aspirin Ec 81 Mg Tbec (Aspirin) .Marland Kitchen... Take 1 tablet by mouth once a day    Metoprolol Tartrate 50 Mg Tabs (Metoprolol tartrate) .Marland Kitchen... 1/2 tab two times a day    Warfarin Sodium 2 Mg Tabs (Warfarin sodium) .Marland Kitchen... 1 daily except 1 1/2 on tue and fri    Lanoxin 0.125 Mg Tabs (Digoxin) .Marland Kitchen... 1 by mouth once daily  hold EKG is done today and reviewed by me.  Patient has atrial fib.  Rate is 62.  There are nonspecific ST-T wave changes.  Digoxin will be held.  I will see her back and decide about the possibility of Holter monitoring.    Problem # 3:  COUMADIN THERAPY (ICD-V58.61) With patient's marked dizzy spells I'm very concerned about her Coumadin therapy.  However she has history of having a CVA in the past.  Her Coumadin is very important.  I am hopeful that her dizziness will improve.  Problem # 4:  HYPERTENSION (ICD-401.9)  Her updated medication list for this problem includes:    Aspirin Ec 81 Mg Tbec (Aspirin) .Marland Kitchen... Take 1 tablet by mouth once a day    Metoprolol Tartrate 50 Mg Tabs (Metoprolol tartrate) .Marland Kitchen... 1/2 tab two times a  day The patient's blood pressure is mildly elevated.  As outlined there was a slight decrease in pressure when she sat up.  I am not convinced of definite significant orthostasis.  No further change in her meds.  Patient Instructions: 1)  Start Meclizine 12.5mg  two times a day  2)  Hold Digoxin 3)  Follow up in 3-4 weeks Prescriptions: MECLIZINE HCL 12.5 MG TABS (MECLIZINE HCL) Take 1 tablet by mouth two times a day  #60 x 6   Entered by:   Meredith Staggers, RN   Authorized by:   Talitha Givens, MD, [facility]   Signed by:   Meredith Staggers, RN on 06/14/2010   Method used:   [facility]        [facility]  [address] [phone]       [address]       [place], [place]  [address]       Ph: [phone] or [phone]       Fax: [phone]   RxID:   [id number] NITROLINGUAL 0.4 MG/SPRAY SOLN (NITROGLYCERIN) One spray under tongue every 5 minutes as needed for chest pain---may repeat times three  #25 x 6   Entered by:   Hardin Negus, RMA   Authorized by:   Talitha Givens, MD, [facility]   Signed by:   Hardin Negus, RMA on 06/14/2010   Method used:   [facility]        [facility]  [address] [phone] ([facility]ail[facility]  [address]       [place], [place]  [address]       Ph: [phone] or [phone]       Fax: [phone]   RxID:   [id number]

## 2010-06-30 NOTE — Progress Notes (Signed)
Summary: Warfarin refill  Phone Note Refill Request Message from:  Fax from Pharmacy on May 09, 2010 10:19 AM  Refills Requested: Medication #1:  WARFARIN SODIUM 2 MG TABS 1 DAILY EXCEPT 1 1/2 ON TUE AND FRI   [facility] [address], [place], [place]  [phone], fax - [phone]    qty - 30  Next Appointment Scheduled: none Initial call taken by: Jerolyn Shin,  May 09, 2010 10:22 AM    Prescriptions: WARFARIN SODIUM 2 MG TABS (WARFARIN SODIUM) 1 DAILY EXCEPT 1 1/2 ON TUE AND FRI  #30 x 3   Entered by:   Army Fossa CMA   Authorized by:   Nolon Rod. Paz MD   Signed by:   Army Fossa CMA on 05/09/2010   Method used:   [facility]        [facility]  [address] [phone][facility] (retail)       [address]       [place], [place]  [address]       Ph: [phone] or [phone]       Fax: [phone]   RxID:   [id number]

## 2010-06-30 NOTE — Progress Notes (Signed)
Summary: Dizzy spells  Phone Note Call from Patient Call back at Home Phone [phone]   Summary of Call: Patient called due to having dizziness again today and notes that she had it at the time of her testing yesterday. It only happens when the patient has been laying down and it makes her where she cannot get up or control herself. Please advise. Initial call taken by: Lucious Groves CMA,  June 09, 2010 12:44 PM  Follow-up for Phone Call        --ER if symptoms severe, needs MRI , please call her insurance Co. --ECHO essentially normal --Carotid u/s --done? Follow-up by: Nolon Rod. Elya Diloreto MD,  June 09, 2010 1:56 PM  Additional Follow-up for Phone Call Additional follow up Details #1::        Patient notified of the above and states that the carotid was done and she was told it was fine.  Should the be MRI of the brain? with or w/o contrast? Additional Follow-up by: Lucious Groves CMA,  June 09, 2010 2:58 PM    Additional Follow-up for Phone Call Additional follow up Details #2::    BRAIN MRI  w/ w-o and MRA Calieb Lichtman E. Jahiem Franzoni MD  June 10, 2010 9:12 AM

## 2010-06-30 NOTE — Progress Notes (Signed)
Summary: REFILL  Phone Note Refill Request Message from:  Patient on May 16, 2010 8:41 AM  Refills Requested: Medication #1:  METOPROLOL TARTRATE 50 MG  TABS 1/2 by mouth bid   Dosage confirmed as above?Dosage Confirmed   Supply Requested: 1 month [facility] [address] [facility]  Initial call taken by: Lavell Islam,  May 16, 2010 8:41 AM    Prescriptions: METOPROLOL TARTRATE 50 MG  TABS (METOPROLOL TARTRATE) 1/2 by mouth bid  #30 Tablet x 2   Entered by:   Army Fossa CMA   Authorized by:   Nolon Rod. Paz MD   Signed by:   Army Fossa CMA on 05/16/2010   Method used:   Electronically to        [facility]  [address] [phone]* (retail)       [address]       [place], [place]  [address]       Ph: [phone] or [phone]       Fax: [phone]   RxID:   [id number]

## 2010-06-30 NOTE — Assessment & Plan Note (Signed)
Summary: PT CHECK//PH   Nurse Visit   Allergies: 1)  ! Penicillin 2)  ! Sulfa 3)  ! Septra 4)  ! Flagyl 5)  ! Vicodin 6)  ! Tramadol Hcl 7)  Prednisone Laboratory Results   Blood Tests   Date/Time Received: Virginia Price [facility]  June 01, 2010 10:11 AM Date/Time Reported: Virginia Price CMA  June 01, 2010 10:11 AM    INR: 3.1   (Normal Range: 0.88-1.12   Therap INR: 2.0-3.5) Comments: Patient notes that she has 2mg  tabs at home and takes 2mg  once daily EXCEPT Mon & Fri when she takes 3mg . She notes that she has missed 2 doses and did eat greens. Patient is also having an issue with dizziness. The dizziness is only when laying and happens for a few days each week for the past couple of weeks. Please advise. Virginia Price CMA  June 01, 2010 10:12 AM  INR only slightly elevated. Advise patient: same Coumadin, recheck in 4 weeks As far as the dizziness, is hard to say what the problem is  without see her. If symptoms severe ---->  ER;  if not severe but persistent---> office visit Esiah Bazinet E. Delia Sitar MD  June 01, 2010 4:37 PM   Patient notified and will come tomorrow. Virginia Price CMA  June 01, 2010 5:05 PM     Orders Added: 1)  Est. Patient Level I [99211] 2)  Protime [[id number]]

## 2010-07-04 ENCOUNTER — Encounter (INDEPENDENT_AMBULATORY_CARE_PROVIDER_SITE_OTHER): Payer: MEDICARE

## 2010-07-04 ENCOUNTER — Encounter: Payer: Self-pay | Admitting: Internal Medicine

## 2010-07-04 DIAGNOSIS — Z7901 Long term (current) use of anticoagulants: Secondary | ICD-10-CM

## 2010-07-04 DIAGNOSIS — Z5181 Encounter for therapeutic drug level monitoring: Secondary | ICD-10-CM

## 2010-07-04 DIAGNOSIS — I4891 Unspecified atrial fibrillation: Secondary | ICD-10-CM

## 2010-07-04 LAB — CONVERTED CEMR LAB: INR: 2.4

## 2010-07-10 ENCOUNTER — Encounter: Payer: Self-pay | Admitting: Cardiology

## 2010-07-11 ENCOUNTER — Ambulatory Visit (INDEPENDENT_AMBULATORY_CARE_PROVIDER_SITE_OTHER): Payer: Medicare Other | Admitting: Cardiology

## 2010-07-11 ENCOUNTER — Telehealth: Payer: Self-pay | Admitting: Internal Medicine

## 2010-07-11 ENCOUNTER — Encounter: Payer: Self-pay | Admitting: Cardiology

## 2010-07-11 DIAGNOSIS — I4891 Unspecified atrial fibrillation: Secondary | ICD-10-CM

## 2010-07-11 DIAGNOSIS — R42 Dizziness and giddiness: Secondary | ICD-10-CM

## 2010-07-14 NOTE — Assessment & Plan Note (Signed)
Summary: PT CHECK/RH....      Allergies Added:  Nurse Visit   Vital Signs:  Patient profile:   77 year old female Weight:      151 pounds Temp:     98.0 degrees F Pulse rate:   82 / minute BP sitting:   110 / 62  Current Medications (verified): 1)  Aspirin Ec 81 Mg Tbec (Aspirin) .... Take 1 Tablet By Mouth Once A Day 2)  Bupropion Hcl 100 Mg Tabs (Bupropion Hcl) .... Take 1 Tablet By Mouth Once Daily 3)  Metoprolol Tartrate 50 Mg  Tabs (Metoprolol Tartrate) .... 1/2 Tab Two Times A Day 4)  Flonase 50 Mcg/act  Susp (Fluticasone Propionate) .... Take 2 Sprays in Each Nostril Once A Day 5)  Nitrolingual 0.4 Mg/spray Soln (Nitroglycerin) .... One Spray Under Tongue Every 5 Minutes As Needed For Chest Pain---May Repeat Times Three 6)  Simvastatin 40 Mg Tabs (Simvastatin) .Marland Kitchen.. 1 By Mouth Daily At Bedtime. 7)  Multivitamins  Tabs (Multiple Vitamin) .... Take 1 Tablet By Mouth Once A Day 8)  Calcium & Vitamin D .... Take 1 Tablet By Mouth Once A Day 9)  Warfarin Sodium 2 Mg Tabs (Warfarin Sodium) .Marland Kitchen.. 1 Daily Except 1 1/2 On Tue and Fri 10)  Lanoxin 0.125 Mg Tabs (Digoxin) .Marland Kitchen.. 1 By Mouth Once Daily  Hold 11)  Famotidine 20 Mg Tabs (Famotidine) .... As Needed 12)  Lidoderm 5 % Ptch (Lidocaine) .... Apply 1 or 2 Patches To Area of Pain and Remove After 12 Hours As Needed 13)  Meclizine Hcl 12.5 Mg Tabs (Meclizine Hcl) .... Take 1 Tablet By Mouth Two Times A Day  Allergies (verified): 1)  ! Penicillin 2)  ! Sulfa 3)  ! Septra 4)  ! Flagyl 5)  ! Vicodin 6)  ! Tramadol Hcl 7)  Prednisone Laboratory Results   Blood Tests      INR: 2.4   (Normal Range: 0.88-1.12   Therap INR: 2.0-3.5) Comments: current dose: 2 MG TABS :1 DAILY EXCEPT 1 1/2 ON TUE AND FRI.Pt advise 4 weeks........Marland KitchenFelecia Deloach CMA  July 04, 2010 10:14 AM  NO CHANGE, NEXT INR 4 WEEKS Ajna Moors E. Nelline Lio MD  July 05, 2010 4:45 PM     Orders Added: 1)  Est. Patient Level I [99211] 2)  Protime  [[id number]]    VITAL SIGNS:  Patient Profile:   77 Years Old Female CC:      PT/INR Height:     67 inches Weight:      151 pounds BP sitting:   110 / 62  (left arm) Temp:     98.0 degrees F. Pulse rate:   82      ANTICOAGULATION RECORD PREVIOUS REGIMEN & LAB RESULTS Anticoagulation Diagnosis:  Atrial fibrillation on  09/17/2007  Previous INR:  3.1 on  06/01/2010 Previous Coumadin Dose(mg):  (2mg ) 1 tab daily except (2.5 mg) 1 1/2 Tues,Fri on  04/29/2010 Previous Regimen:  SAME on  07/02/2008 Previous Coagulation Comments:  currently on 1 tab once daily except 1/2 tab on tues, thurs on  08/21/2007  NEW REGIMEN & LAB RESULTS Current INR: 2.4 Current Coumadin Dose(mg): 1 tab DAILY EXCEPT 1 1/2 ON TUE AND FRI Regimen: same  Provider: Kortlyn Koltz Repeat testing in: 4 weeks  Anticoagulation Visit Questionnaire Coumadin dose missed/changed:  No Abnormal Bleeding Symptoms:  No  Any diet changes including alcohol intake, vegetables or greens since the last visit:  No Any illnesses or hospitalizations since the  last visit:  No Any signs of clotting since the last visit (including chest discomfort, dizziness, shortness of breath, arm tingling, slurred speech, swelling or redness in leg):  No  MEDICATIONS ASPIRIN EC 81 MG TBEC (ASPIRIN) Take 1 tablet by mouth once a day BUPROPION HCL 100 MG TABS (BUPROPION HCL) Take 1 tablet by mouth once daily METOPROLOL TARTRATE 50 MG  TABS (METOPROLOL TARTRATE) 1/2 tab two times a day FLONASE 50 MCG/ACT  SUSP (FLUTICASONE PROPIONATE) Take 2 sprays in each nostril once a day NITROLINGUAL 0.4 MG/SPRAY SOLN (NITROGLYCERIN) One spray under tongue every 5 minutes as needed for chest pain---may repeat times three SIMVASTATIN 40 MG TABS (SIMVASTATIN) 1 by mouth daily at bedtime. MULTIVITAMINS  TABS (MULTIPLE VITAMIN) Take 1 tablet by mouth once a day * CALCIUM & VITAMIN D Take 1 tablet by mouth once a day WARFARIN SODIUM 2 MG TABS (WARFARIN SODIUM) 1 DAILY  EXCEPT 1 1/2 ON TUE AND FRI LANOXIN 0.125 MG TABS (DIGOXIN) 1 by mouth once daily  HOLD FAMOTIDINE 20 MG TABS (FAMOTIDINE) as needed LIDODERM 5 % PTCH (LIDOCAINE) Apply 1 or 2 patches to area of pain and remove after 12 hours as needed MECLIZINE HCL 12.5 MG TABS (MECLIZINE HCL) Take 1 tablet by mouth two times a day

## 2010-07-20 NOTE — Miscellaneous (Signed)
  Clinical Lists Changes  Observations: Added new observation of PAST MED HX:  Atrial fibrillation (Dx 10-03 per Event monitor) Coronary artery disease EF 55-60%....echo.Marland KitchenMarland Kitchen1/03/2011 Hyperlipidemia Hypertension Cerebrovascular accident, hx  (10-03 L MCA territory) Allergic rhinitis GERD, h/o esophageal stricture ? fibromyalgia Moh's surgery 11-06 Allergic rhinitis Diverticulosis h/o Adenomatous colon polyps abnormal CT chest ("innumerable small nodules"), sees Dr Delton Coombes  Dizziness    severe... January, 2012  /   CT Head,,,06/16/2010...atrophy Coumadin therapy (07/10/2010 17:34) Added new observation of REFERRING MD: Loreen Freud, DO (07/10/2010 17:34) Added new observation of PRIMARY MD: Willow Ora, MD (07/10/2010 17:34)       Past History:  Past Medical History:  Atrial fibrillation (Dx 10-03 per Event monitor) Coronary artery disease EF 55-60%....echo.Marland KitchenMarland Kitchen1/03/2011 Hyperlipidemia Hypertension Cerebrovascular accident, hx  (10-03 L MCA territory) Allergic rhinitis GERD, h/o esophageal stricture ? fibromyalgia Moh's surgery 11-06 Allergic rhinitis Diverticulosis h/o Adenomatous colon polyps abnormal CT chest ("innumerable small nodules"), sees Dr Delton Coombes  Dizziness    severe... January, 2012  /   CT Head,,,06/16/2010...atrophy Coumadin therapy

## 2010-07-20 NOTE — Assessment & Plan Note (Signed)
Summary: 1 month rov/sl ok per heather/sl/kl  Medications Added DIGOXIN 0.125 MG TABS (DIGOXIN) Take one tablet by mouth daily      Allergies Added:   Visit Type:  Follow-up Referring Provider:  Loreen Freud, DO Primary Provider:  Willow Ora, MD  CC:  dizziness.  History of Present Illness: the patient is seen for followup of dizziness and atrial fibrillation.  I saw her last June 14, 2010.  At that time I decided to put her digoxin on hold for the possibility that she could be having bradycardia.  During the past visit she also has orthostatic blood pressure check.  There was mild decrease in pressure when standing but was not dramatic.  I did not think her symptoms were from orthostasis.  She mentions to me that she does feel some increased heart rate at times.  She notices this at nighttime at rest.  She cannot tell me if this is changed since we put her digoxin on hold.  Current Medications (verified): 1)  Aspirin Ec 81 Mg Tbec (Aspirin) .... Take 1 Tablet By Mouth Once A Day 2)  Bupropion Hcl 100 Mg Tabs (Bupropion Hcl) .... Take 1 Tablet By Mouth Once Daily 3)  Metoprolol Tartrate 50 Mg  Tabs (Metoprolol Tartrate) .... 1/2 Tab Two Times A Day 4)  Flonase 50 Mcg/act  Susp (Fluticasone Propionate) .... Take 2 Sprays in Each Nostril Once A Day 5)  Nitrolingual 0.4 Mg/spray Soln (Nitroglycerin) .... One Spray Under Tongue Every 5 Minutes As Needed For Chest Pain---May Repeat Times Three 6)  Simvastatin 40 Mg Tabs (Simvastatin) .Marland Kitchen.. 1 By Mouth Daily At Bedtime. 7)  Multivitamins  Tabs (Multiple Vitamin) .... Take 1 Tablet By Mouth Once A Day 8)  Calcium & Vitamin D .... Take 1 Tablet By Mouth Once A Day 9)  Warfarin Sodium 2 Mg Tabs (Warfarin Sodium) .Marland Kitchen.. 1 Daily Except 1 1/2 On Tue and Fri 10)  Famotidine 20 Mg Tabs (Famotidine) .... As Needed 11)  Lidoderm 5 % Ptch (Lidocaine) .... Apply 1 or 2 Patches To Area of Pain and Remove After 12 Hours As Needed 12)  Meclizine Hcl 12.5 Mg  Tabs (Meclizine Hcl) .... Take 1 Tablet By Mouth Two Times A Day  Allergies (verified): 1)  ! Penicillin 2)  ! Sulfa 3)  ! Septra 4)  ! Flagyl 5)  ! Vicodin 6)  ! Tramadol Hcl 7)  Prednisone  Past History:  Past Medical History:  Atrial fibrillation (Dx 10-03 per Event monitor) Coronary artery disease EF 55-60%....echo.Marland KitchenMarland Kitchen1/03/2011 Hyperlipidemia Hypertension Cerebrovascular accident, hx  (10-03 L MCA territory) Allergic rhinitis GERD, h/o esophageal stricture ? fibromyalgia Moh's surgery 11-06 Allergic rhinitis Diverticulosis h/o Adenomatous colon polyps.Marland Kitchen abnormal CT chest ("innumerable small nodules"), sees Dr Delton Coombes  Dizziness    severe... January, 2012  /   CT Head,,,06/16/2010...atrophy Coumadin therapy  Review of Systems       the patient denies fever, chills, headache, sweats, rash, change in vision, change in hearing, chest pain, cough, nausea vomiting, urinary symptoms.  All other systems are reviewed and are negative.  Vital Signs:  Patient profile:   77 year old female Height:      67 inches Weight:      150 pounds BMI:     23.58 Pulse rate:   65 / minute BP sitting:   130 / 76  (left arm) Cuff size:   regular  Vitals Entered By: Hardin Negus, RMA (July 11, 2010 10:38 AM)  Physical Exam  General:  Patient is stable today but still says she has dizziness. Eyes:  no xanthelasma. Neck:  no jugular venous distention. Chest Wall:  no chest wall tenderness. Lungs:  lungs are clear respiratory effort is nonlabored. Heart:  cardiac exam reveals an S1-S2.  The rhythm is irregularly irregular.  Rate is faster on physical exam that we obtain by blood pressure cuff. Abdomen:  abdomen is soft. Msk:  no musculoskeletal deformities. Extremities:  no peripheral edema. Skin:  no skin rashes. Psych:  Patient is oriented to person time and place.  She is here with her daughter.   Impression & Recommendations:  Problem # 1:  COUMADIN THERAPY  (ICD-V58.61) Coumadin is continued. She does have dizziness but she has not had a significant fall.  She is quite reliable.  Problem # 2:  DIZZINESS (ICD-780.4) Unfortunately the dizziness persists.  I had put her on 12.5 mg of meclizine twice a day.  I don't think this is causing any significant sleepiness.  She says that it has not helped.  I am not inclined to increase the dose.  CT Scan of the head was done showing some atrophy.  She has a neurology appointment but not until April.  I'm not convinced that her atrial fib he is playing a role with his dizziness.  I still think this may be some type of vertigo.  Neurology workup certainly appears to be the next appropriate evaluation.  Problem # 3:  ATRIAL FIBRILLATION (ICD-427.31)  I had put her digoxin on hold.  However her resting rate today is increased on physical exam.  She does need a 24-hour monitor to assess for atrial fibrillation control.  I feel it would be more appropriate to have this done while she is on digoxin.  Digoxin will be restarted and she will then have a followup 24-hour Holter in approximately 2 weeks.  I will then see her for followup.  Orders: Holter (Holter)  Problem # 4:  HYPERTENSION (ICD-401.9)  Her updated medication list for this problem includes:    Aspirin Ec 81 Mg Tbec (Aspirin) .Marland Kitchen... Take 1 tablet by mouth once a day    Metoprolol Tartrate 50 Mg Tabs (Metoprolol tartrate) .Marland Kitchen... 1/2 tab two times a day Blood pressure is controlled.  No change in therapy.  Patient Instructions: 1)  Restart your Digoxin 0.125mg , start  by taking 2 tabs two times a day for 2 days then decrease to 1 tab daily 2)  Your physician has recommended that you wear a holter monitor.  Holter monitors are medical devices that record the heart's electrical activity. Doctors most often use these monitors to diagnose arrhythmias. Arrhythmias are problems with the speed or rhythm of the heartbeat. The monitor is a small, portable device. You  can wear one while you do your normal daily activities. This is usually used to diagnose what is causing palpitations/syncope (passing out).  Needs in 2 weeks 3)  Follow up in 4 weeks

## 2010-07-20 NOTE — Progress Notes (Signed)
Summary: refill    Phone Note Refill Request Message from:  Fax from Pharmacy on July 11, 2010 11:30 AM  Refills Requested: Medication #1:  BUPROPION HCL 100 MG TABS Take 1 tablet by mouth once daily [facility] [address] - fax [phone]  Initial call taken by: Okey Regal Spring,  July 11, 2010 11:31 AM  Follow-up for Phone Call        was changed to once daily- unsure by who..okay to fill?  Follow-up by: Army Fossa CMA,  July 11, 2010 11:34 AM  Additional Follow-up for Phone Call Additional follow up Details #1::        I think she takes it two times a day, please d/w patient  Kaleab Frasier E. Naven Giambalvo MD  July 11, 2010 1:11 PM        Additional Follow-up for Phone Call Additional follow up Details #2::    left message for pt to call back. Army Fossa CMA  July 11, 2010 1:28 PM   Additional Follow-up for Phone Call Additional follow up Details #3:: Details for Additional Follow-up Action Taken: I spoke w/ pt she states she takes it two times a day, she does not need a refill either at this time. Army Fossa CMA  July 11, 2010 3:47 PM   New/Updated Medications: BUPROPION HCL 100 MG TABS (BUPROPION HCL) Take 1 tablet by mouth two times a day.

## 2010-07-28 ENCOUNTER — Ambulatory Visit (INDEPENDENT_AMBULATORY_CARE_PROVIDER_SITE_OTHER): Payer: Medicare Other

## 2010-07-28 ENCOUNTER — Encounter (INDEPENDENT_AMBULATORY_CARE_PROVIDER_SITE_OTHER): Payer: Medicare Other

## 2010-07-28 ENCOUNTER — Encounter: Payer: Self-pay | Admitting: Internal Medicine

## 2010-07-28 DIAGNOSIS — I4891 Unspecified atrial fibrillation: Secondary | ICD-10-CM

## 2010-07-28 DIAGNOSIS — Z7901 Long term (current) use of anticoagulants: Secondary | ICD-10-CM

## 2010-08-06 ENCOUNTER — Encounter: Payer: Self-pay | Admitting: Cardiology

## 2010-08-08 ENCOUNTER — Encounter: Payer: Self-pay | Admitting: Internal Medicine

## 2010-08-08 ENCOUNTER — Telehealth: Payer: Self-pay | Admitting: Cardiology

## 2010-08-08 ENCOUNTER — Telehealth: Payer: Self-pay | Admitting: Internal Medicine

## 2010-08-08 ENCOUNTER — Ambulatory Visit (INDEPENDENT_AMBULATORY_CARE_PROVIDER_SITE_OTHER): Payer: Medicare Other

## 2010-08-08 DIAGNOSIS — Z7901 Long term (current) use of anticoagulants: Secondary | ICD-10-CM

## 2010-08-08 DIAGNOSIS — I4891 Unspecified atrial fibrillation: Secondary | ICD-10-CM

## 2010-08-08 LAB — CONVERTED CEMR LAB: POC INR: 3

## 2010-08-09 NOTE — Medication Information (Signed)
Summary: pt/cbs   PCP: Willow Ora, MD Indication 1: Atrial fibrillation  Vital Signs: Weight: 152.25 lbs.  Pulse Rate: 78  Rhythm: regular  Blood Pressure:  126 / 86            Allergies: 1)  ! Penicillin 2)  ! Sulfa 3)  ! Septra 4)  ! Flagyl 5)  ! Vicodin 6)  ! Tramadol Hcl 7)  Prednisone  Anticoagulation Management History:      Positive risk factors for bleeding include an age of 10 years or older.  The bleeding index is 'intermediate risk'.  Positive CHADS2 values include History of HTN and Age > 35 years old.  Her last INR was 2.4 and today's INR is 4.3.    Anticoagulation Management Assessment/Plan:      The patient's current anticoagulation dose is Warfarin sodium 2 mg tabs: 1 DAILY EXCEPT 1 1/2 ON TUE AND FRI.  The next INR is due 4 weeks.         Vital Signs:  Patient Profile:   77 Years Old Female Height:     67 inches (170.18 cm) Weight:      152.25 pounds Pulse rate:   78 / minute Pulse rhythm:   regular BP sitting:   126 / 86  (left arm) Cuff size:   regular  Vitals Entered By: Army Fossa CMA (July 28, 2010 2:15 PM)                 Laboratory Results   Blood Tests      INR: 4.3   (Normal Range: 0.88-1.12   Therap INR: 2.0-3.5) Comments: 2 MG Tabs Takes 1 tab daily except Tues and Fri 1/2 tab Per Dr.Hopper none today then 1/2 pill M,W,F 2mg  all other days  Recheck in 10 days  -------------------------------------- Brent E. Lavontae Cornia MD  August 01, 2010 1:16 PM

## 2010-08-15 ENCOUNTER — Telehealth: Payer: Self-pay | Admitting: Internal Medicine

## 2010-08-16 NOTE — Progress Notes (Signed)
Summary: refill   Phone Note Refill Request Message from:  Fax from Pharmacy on August 08, 2010 12:01 PM  Refills Requested: Medication #1:  DIGOXIN 0.125 MG TABS Take one tablet by mouth daily. [facility] [address] fax [phone]  Initial call taken by: Okey Regal Spring,  August 08, 2010 12:02 PM  Follow-up for Phone Call        Are you the prescribing doctor?  Follow-up by: Army Fossa CMA,  August 08, 2010 1:03 PM  Additional Follow-up for Phone Call Additional follow up Details #1::         last note from cardiology reviewed, and they like her to re- start  digoxin. okay to call #30 , 3 refills Additional Follow-up by: Fumiye Lubben E. Caetano Oberhaus MD,  August 08, 2010 1:22 PM    Prescriptions: DIGOXIN 0.125 MG TABS (DIGOXIN) Take one tablet by mouth daily  #30 x 3   Entered by:   Army Fossa CMA   Authorized by:   Nolon Rod. Cailah Reach MD   Signed by:   Army Fossa CMA on 08/08/2010   Method used:   [facility]  [address] [phone][facility]       [address]       [place], [place]  [address]       Ph: [phone] or [phone]       Fax: [phone]   RxID:   [id number]

## 2010-08-16 NOTE — Medication Information (Signed)
Summary: PT Check/drb  Anticoagulant Therapy  Managed by: Willow Ora, MD PCP: Willow Ora, MD Indication 1: Atrial fibrillation INR POC 3.0  Vital Signs: Blood Pressure:  120 / 78   Dietary changes: no     Bleeding/hemorrhagic complications: no    Recent/future hospitalizations: no    Any changes in medication regimen? no    Recent/future dental: no  Any missed doses?: yes     Details: missed 1/2 dose for 2 days   Is patient compliant with meds? yes      Comments: Dose was suppose to be held 3/1 then 1/2 pill M,W,F 2mg  all other days. Pt states she took 1/2 on Tues. & Fri. and 2mg  all other days.Marland KitchenMarland KitchenMarland KitchenDoristine Devoid CMA  August 08, 2010 3:46 PM   plan: 2mg  tablet one tablet daily, M-W-F take only half tab next INR 2 weeks Avik Leoni E. Mehkai Gallo MD  August 08, 2010 4:58 PM   Spoke w/ patient aware of instructions...Marland KitchenMarland KitchenDoristine Devoid CMA  August 08, 2010 5:00 PM   Allergies: 1)  ! Penicillin 2)  ! Sulfa 3)  ! Septra 4)  ! Flagyl 5)  ! Vicodin 6)  ! Tramadol Hcl 7)  Prednisone  Anticoagulation Management History:      Positive risk factors for bleeding include an age of 18 years or older.  The bleeding index is 'intermediate risk'.  Positive CHADS2 values include History of HTN and Age > 60 years old.  Her last INR was 4.3.  INR POC: 3.0.    Anticoagulation Management Assessment/Plan:      The patient's current anticoagulation dose is Warfarin sodium 2 mg tabs: 1 DAILY EXCEPT 1 1/2 ON TUE AND FRI.  The next INR is due 4 weeks.  Results were reviewed/authorized by Willow Ora, MD.

## 2010-08-18 ENCOUNTER — Ambulatory Visit (INDEPENDENT_AMBULATORY_CARE_PROVIDER_SITE_OTHER): Payer: Medicare Other | Admitting: Cardiology

## 2010-08-18 ENCOUNTER — Ambulatory Visit (INDEPENDENT_AMBULATORY_CARE_PROVIDER_SITE_OTHER): Payer: Medicare Other | Admitting: *Deleted

## 2010-08-18 ENCOUNTER — Encounter: Payer: Self-pay | Admitting: Cardiology

## 2010-08-18 DIAGNOSIS — I4891 Unspecified atrial fibrillation: Secondary | ICD-10-CM

## 2010-08-18 DIAGNOSIS — R9389 Abnormal findings on diagnostic imaging of other specified body structures: Secondary | ICD-10-CM | POA: Insufficient documentation

## 2010-08-18 DIAGNOSIS — R42 Dizziness and giddiness: Secondary | ICD-10-CM

## 2010-08-18 DIAGNOSIS — E785 Hyperlipidemia, unspecified: Secondary | ICD-10-CM | POA: Insufficient documentation

## 2010-08-18 DIAGNOSIS — Z7901 Long term (current) use of anticoagulants: Secondary | ICD-10-CM

## 2010-08-18 DIAGNOSIS — R079 Chest pain, unspecified: Secondary | ICD-10-CM | POA: Insufficient documentation

## 2010-08-18 DIAGNOSIS — I10 Essential (primary) hypertension: Secondary | ICD-10-CM | POA: Insufficient documentation

## 2010-08-18 DIAGNOSIS — K219 Gastro-esophageal reflux disease without esophagitis: Secondary | ICD-10-CM | POA: Insufficient documentation

## 2010-08-18 LAB — POCT INR: INR: 1.2

## 2010-08-18 NOTE — Assessment & Plan Note (Signed)
The patient's dizziness is definitely improving.  There is no evidence that is cardiac in origin.  I encouraged her to begin to wean her meclizine as she feels better.  She is on a small dose b.i.d.

## 2010-08-18 NOTE — Assessment & Plan Note (Signed)
Lipids are treated.  Patient has back pain and she asked me if simvastatin could be causing this.  I told her that I thought it was very unlikely that it was causing localized back pain.  She will continue the medication.

## 2010-08-18 NOTE — Progress Notes (Signed)
HPI   Patient returns for followup of atrial fibrillation.  I saw her last and where the 13th, 2012.  She had some dizziness.  I thought there might be a component of orthostasis and possibly a component of vertigo.  She is on a small dose of meclizine.  Holter monitor was done specifically to look at the range of her heart rates with atrial fib.  I put her back on digoxin before the monitor was obtained.  The Holter monitor was done and reviewed by me.  The heart ranges were very good.  There was no marked Bradycardia or tachycardia.  Today she is feeling better and I used a gray U. Turn her key to avoid a she was given a For you for a cystoscopy for a Allergies  Allergen Reactions  . Hydrocodone-Acetaminophen     REACTION: nausea  . Metronidazole   . Penicillins   . Prednisone     REACTION: ? reaction  . Sulfamethoxazole W/Trimethoprim   . Sulfonamide Derivatives   . Tramadol Hcl     Current Outpatient Prescriptions  Medication Sig Dispense Refill  . aspirin 81 MG tablet Take 81 mg by mouth daily.        Marland Kitchen buPROPion (WELLBUTRIN) 100 MG tablet 1 tab daily and half tab on Tuesday and friday      . calcium-vitamin D (OSCAL) 250-125 MG-UNIT per tablet Take 1 tablet by mouth daily.        . digoxin (LANOXIN) 0.125 MG tablet Take 125 mcg by mouth daily.        . famotidine (PEPCID) 20 MG tablet Take 20 mg by mouth as needed.        . fluticasone (FLONASE) 50 MCG/ACT nasal spray 2 sprays by Nasal route daily.        Marland Kitchen lidocaine (LIDODERM) 5 % Place 1 patch onto the skin as directed. Remove & Discard patch within 12 hours or as directed by MD       . loratadine (CLARITIN) 10 MG tablet Take 10 mg by mouth daily.        . meclizine (ANTIVERT) 12.5 MG tablet Take 12.5 mg by mouth 2 (two) times daily.        . metoprolol (LOPRESSOR) 50 MG tablet Take 50 mg by mouth daily.       . multivitamin (THERAGRAN) per tablet Take 1 tablet by mouth daily.        . nitrofurantoin (MACRODANTIN) 100 MG capsule  Take 100 mg by mouth at bedtime.        . nitroGLYCERIN (NITROLINGUAL) 0.4 MG/SPRAY spray Place 1 spray under the tongue every 5 (five) minutes as needed.        . simvastatin (ZOCOR) 40 MG tablet Take 40 mg by mouth at bedtime.        Marland Kitchen warfarin (COUMADIN) 2 MG tablet Take 2 mg by mouth as directed.          History   Social History  . Marital Status: Widowed    Spouse Name: N/A    Number of Children: N/A  . Years of Education: N/A   Occupational History  . Not on file.   Social History Main Topics  . Smoking status: Never Smoker   . Smokeless tobacco: Not on file  . Alcohol Use: No  . Drug Use:   . Sexually Active:    Other Topics Concern  . Not on file   Social History Narrative  . No narrative on  file    Family History  Problem Relation Age of Onset  . Heart disease Mother   . Colon polyps Mother   . Heart disease Father     Past Medical History  Diagnosis Date  . Atrial fibrillation     Coumadin therapy  . Chest pain     Catheterization normal 2004, /  nuclear 2006 no ischemia  . Hyperlipidemia   . Hypertension   . Stroke   . Allergic rhinitis   . GERD (gastroesophageal reflux disease)   . Diverticular disease   . Colon polyp   . Abnormal chest CT     "innumerable small nodules  . Abnormal head CT 06/16/2010    atroph y  . Cataract     bilateral  . Drug therapy     Coumadin... atrial fibrillation  . Malignant melanoma   . Memory loss   . Restless leg syndrome   . CVA (cerebral infarction)     October, 2003.Marland Kitchen left middle cerebral artery territory    Past Surgical History  Procedure Date  . Cholecystectomy 1991  . Radical hysterectomy 1978  . Cataract extraction     bilateral  . Appendectomy   . Mohs surgery     03/2005    ROS  The patient denies fever, chills, headache, sweats, rash, change in vision, change in hearing, chest pain, cough, nausea vomiting, urinary symptoms.  All other systems are reviewed and are negative PHYSICAL  EXAM The patient looks quite stable today.  She is here with a family member.  She is oriented to person time and place.  Affect is normal.  Head is atraumatic.  There is no xanthelasma.  No carotid bruits prednisone jugular venous extension.  Lungs are clear.  Respiratory effort is unlabored.  Cardiac exam reveals S1 and S2.  The rhythm is irregularly irregular.  The abdomen is soft.  There is no peripheral edema.  There is no musculoskeletal deformities. Filed Vitals:   08/18/10 1419  BP: 114/67  Pulse: 88  Resp: 16  Weight: 154 lb (69.854 kg)    EKG  The patient's Holter monitor dated July 28, 2010 his Foley reviewed.  Her average heart rate over the entire day was 82 beats per minute.  Minimal heart rate was 65 and maximum 138.  She does not have any times when she has persistent tachycardia.

## 2010-08-18 NOTE — Patient Instructions (Signed)
Your physician wants you to follow-up in:  6 months. You will receive a reminder letter in the mail two months in advance. If you don't receive a letter, please call our office to schedule the follow-up appointment.   

## 2010-08-18 NOTE — Patient Instructions (Addendum)
Will forward to Dr. Drue Novel for further instructions. ------------------------------------------------------------------ Advise patient:  2 mg tablets  One every day ( 14 mg weekly)  next INR in 2 weeks J PAZ ---------------------------------------------------  Left msg on machine for return call.. ------------------------------  Spoke w/ pt aware of instructions.

## 2010-08-18 NOTE — Assessment & Plan Note (Signed)
The patient's atrial fibrillation rate is nicely controlled on her current medications.  She is on Coumadin.  No further workup is needed.  No further medication adjustment as needed.

## 2010-08-19 ENCOUNTER — Other Ambulatory Visit: Payer: Self-pay | Admitting: Internal Medicine

## 2010-08-19 MED ORDER — METOPROLOL TARTRATE 50 MG PO TABS: ORAL_TABLET | ORAL | Status: DC

## 2010-08-23 ENCOUNTER — Other Ambulatory Visit: Payer: Self-pay | Admitting: Internal Medicine

## 2010-08-23 MED ORDER — DIGOXIN 125 MCG PO TABS: 125.0000 ug | ORAL_TABLET | Freq: Every day | ORAL | Status: DC

## 2010-08-25 NOTE — Progress Notes (Signed)
Summary: refill  Phone Note Refill Request Message from:  Fax from Pharmacy on August 15, 2010 10:55 AM  Refills Requested: Medication #1:  ZOVIRAX 5% oint apply 6x daily x 1 week PRN [facility] [address] [place] - fax [phone]  Initial call taken by: Okey Regal Spring,  August 15, 2010 11:01 AM  Follow-up for Phone Call         please call patient, okay to refill these medicines if she is using it for fever blisters. if she has any other skin condition, she needs to be seen Follow-up by: Nedda Gains E. Amea Mcphail MD,  August 15, 2010 12:57 PM  Additional Follow-up for Phone Call Additional follow up Details #1::        She is using for fever blisters. Army Fossa CMA  August 15, 2010 1:36 PM     New/Updated Medications: ZOVIRAX 5 % OINT (ACYCLOVIR) apply 6x daily x 1 week PRN Prescriptions: ZOVIRAX 5 % OINT (ACYCLOVIR) apply 6x daily x 1 week PRN  #1 x 2   Entered by:   Army Fossa CMA   Authorized by:   Nolon Rod. Donita Newland MD   Signed by:   Army Fossa CMA on 08/15/2010   Method used:   Electronically to        [facility]  [address] [phone][facility] (retail)       [address]       [place], [place]  [address]       Ph: [phone] or [phone]       Fax: [phone]   RxID:   [id number]

## 2010-08-25 NOTE — Progress Notes (Signed)
Summary: pt want resutls   Phone Note Call from Patient Call back at Home Phone [phone] P PH     Caller: Patient Reason for Call: Talk to Nurse, Talk to Doctor Summary of Call: pt wore a monitor and wanted the results before she left at 2:30 to go to another MD appt/lg Initial call taken by: Omer Jack,  August 08, 2010 1:42 PM  Follow-up for Phone Call        Left message to call back Meredith Staggers, RN  August 08, 2010 3:10 PM    pt rtn called from Parnell s. [phone] Lorne Skeens  August 08, 2010 4:55 PM  Left message to call back Meredith Staggers, RN  August 11, 2010 12:32 PM   Additional Follow-up for Phone Call Additional follow up Details #1::        OK Talitha Givens, MD, [facility]  August 18, 2010 2:31 PM      Appended Document: pt want resutls pt aware at [facility] 3/22

## 2010-08-31 ENCOUNTER — Ambulatory Visit (INDEPENDENT_AMBULATORY_CARE_PROVIDER_SITE_OTHER): Payer: Medicare Other | Admitting: *Deleted

## 2010-08-31 DIAGNOSIS — I4891 Unspecified atrial fibrillation: Secondary | ICD-10-CM

## 2010-08-31 DIAGNOSIS — Z7901 Long term (current) use of anticoagulants: Secondary | ICD-10-CM

## 2010-09-01 NOTE — Patient Instructions (Addendum)
INR 1.6 She is currently taking 2 mg tablets daily (14 mg weekly) Plan:  2 MG TABS :1 DAILY EXCEPT 1 1/2 ON TUE AND FRI (16mg /week); she did very well on that does previously. Next INR in 2 weeks [facility] w/ pt aware of dose instructions [place] Price

## 2010-09-13 ENCOUNTER — Ambulatory Visit (INDEPENDENT_AMBULATORY_CARE_PROVIDER_SITE_OTHER): Payer: Medicare Other | Admitting: *Deleted

## 2010-09-13 DIAGNOSIS — I4891 Unspecified atrial fibrillation: Secondary | ICD-10-CM

## 2010-09-13 DIAGNOSIS — Z7901 Long term (current) use of anticoagulants: Secondary | ICD-10-CM

## 2010-09-13 NOTE — Patient Instructions (Addendum)
Spoke w/ pt informed there is no change but will see how soon to have her return.  -------------------------------------- INR good, no change, 2 weeks [place]   Pt aware.

## 2010-09-14 ENCOUNTER — Ambulatory Visit: Payer: Medicare Other

## 2010-09-15 ENCOUNTER — Other Ambulatory Visit: Payer: Self-pay | Admitting: *Deleted

## 2010-09-15 MED ORDER — SIMVASTATIN 40 MG PO TABS: 40.0000 mg | ORAL_TABLET | Freq: Every day | ORAL | Status: DC

## 2010-09-27 ENCOUNTER — Ambulatory Visit: Payer: Medicare Other

## 2010-09-27 ENCOUNTER — Ambulatory Visit (INDEPENDENT_AMBULATORY_CARE_PROVIDER_SITE_OTHER): Payer: Medicare Other | Admitting: *Deleted

## 2010-09-27 DIAGNOSIS — Z7901 Long term (current) use of anticoagulants: Secondary | ICD-10-CM

## 2010-09-27 DIAGNOSIS — I4891 Unspecified atrial fibrillation: Secondary | ICD-10-CM

## 2010-09-27 LAB — POCT INR: INR: 3.4

## 2010-09-27 NOTE — Patient Instructions (Addendum)
Pt informed will call her before end of the day w/ change.  ---------------------------------- No change, recheck in 2 weeks [facility]   Pt aware Doristine Devoid

## 2010-10-07 ENCOUNTER — Ambulatory Visit (INDEPENDENT_AMBULATORY_CARE_PROVIDER_SITE_OTHER): Payer: Medicare Other | Admitting: *Deleted

## 2010-10-07 DIAGNOSIS — I4891 Unspecified atrial fibrillation: Secondary | ICD-10-CM

## 2010-10-07 DIAGNOSIS — Z7901 Long term (current) use of anticoagulants: Secondary | ICD-10-CM

## 2010-10-07 NOTE — Patient Instructions (Signed)
Informed pt no change recheck in 2 weeks.

## 2010-10-10 ENCOUNTER — Other Ambulatory Visit: Payer: Self-pay | Admitting: *Deleted

## 2010-10-10 MED ORDER — WARFARIN SODIUM 2 MG PO TABS: 2.0000 mg | ORAL_TABLET | ORAL | Status: DC

## 2010-10-11 NOTE — Assessment & Plan Note (Signed)
[facility]                            CARDIOLOGY OFFICE NOTE   [name], [name]                       MRN:          [id number]  DATE:01/08/2007                            DOB:          1926-01-03    [place]ia Price is doing well.  She has rare dizziness when she turns  rapidly.  She has rare shortness of breath.  In general, she has done  very well.  She has not had any syncope.  She has not had any major  chest pain.  We know she has atrial fibrillation.  It appears that her  rate is controlled.   PAST MEDICAL HISTORY:   ALLERGIES:  PENICILLIN, SULFA, SEPTRA, and a question of PREDNISONE.   MEDICATIONS:  Digoxin.  Aspirin.  Coumadin.  Multivitamin.  Vytorin.  Lopressor.   OTHER MEDICAL PROBLEMS:  See the list below.   REVIEW OF SYSTEMS:  Otherwise, her review of systems is negative.   PHYSICAL EXAM:  Blood pressure today is 120/58 with a pulse of 64.  Weight is 136.  The patient is here with her family member.  The patient has no  xanthelasma.  She has normal extraocular motions.  The patient is  oriented x3.  Affect is normal.  She has no carotid bruits.  There is no jugular venous distension.  LUNGS:  Clear.  Respiratory effort is not labored.  CARDIAC:  Reveals that her rhythm is irregularly irregular.  There is a  very soft systolic murmur.  ABDOMEN:  Soft.  She has normal bowel sounds.  She has no peripheral edema.   EKG:  Reveals atrial fibrillation with old ST changes and a controlled  ventricular response.   PROBLEMS:  Listed on my note of July 16, 2006.  1. Atrial fibrillation.  We believe that her rate is controlled.  She      does not sense her atrial fibrillation, so we have to be careful.      However, I feel it is not appropriate to do any further testing at      this time.  She will remain on the same medicines.  2. Abnormal chest x-ray with a nodule that is followed by Dr. Delton Coombes.      He saw her in March of 2008.  His  plan is to follow her with      followup CT.   Cardiac status is stable.    Luis Abed, MD, [facility]  Electronically Signed   JDK/MedQ  DD: 01/08/2007  DT: 01/09/2007  Job #: [id number]   cc:   Willow Ora, MD

## 2010-10-11 NOTE — Assessment & Plan Note (Signed)
[facility]                             PULMONARY OFFICE NOTE   RALYNN, SAN                       MRN:          [id number]  DATE:03/04/2007                            DOB:          31-Oct-1925    SUBJECTIVE:  Ms. Virginia Price is a 77 year old woman with a history of  coronary artery disease, atrial fibrillation, allergic rhinitis, and  cough .  I have seen her for right upper lobe and right middle lobe  pulmonary nodular disease.  We have discussed possible bronchoscopy but  deferred this and instead decided to repeat serial CT scans.  Her most  recent CT scan was to have been in July 2008.  She did not have that CT  scan done.  She continues to have a dry cough.  Her post nasal drip is  significant and bothersome on an every day basis.  She has clear nasal  drainage.  She is using Flonase on a p.r.n. basis.  She tells me that  her breathing is stable.  She is not having any new pulmonary symptoms.   CURRENT MEDICATIONS:  1. Digoxin 0.125 mg daily.  2. Aspirin 81 mg daily.  3. Coumadin as directed.  4. Multivitamin once daily.  5. Vytorin 10/20 mg daily.  6. Lopressor 25 mg b.i.d.  7. Bupropion 100 mg b.i.d.  8. Vitamin B12 once daily.  9. Flonase 2 sprays each nostril daily p.r.n.  10.Nitroglycerin p.r.n.   PHYSICAL EXAMINATION:  GENERAL:  This is a pleasant, thin, elderly  woman.  She is quite forgetful and had difficulty with history-giving.  HEENT:  She has some mild posterior pharyngeal erythema.  NECK:  Supple without lymphadenopathy or stridor.  LUNGS:  Clear to auscultation bilaterally.  HEART:  Regular without murmur.  ABDOMEN:  Soft, nontender, nondistended with positive bowel sounds.  EXTREMITIES:  No cyanosis, clubbing, or edema.   IMPRESSION:  1. Pulmonary nodular disease on CT scan of the chest that was new in      the right upper and middle lobes compared with a CT scan performed      in 2004.  She had not had a repeat CT  scan of the chest as we had      initially planned.  She agrees to go ahead and do so.  2. Allergic rhinitis with associated cough.  She is not currently on      any standing regimen for her allergic rhinitis.  3. Gastroesophageal reflux disease with intermittent symptoms.  This      may be contributing to her cough as well.   PLANS:  1. A CT scan of the chest with contrast will be performed after she      has screening blood work to ensure no renal insufficiency.  We will      follow up the results of this testing in one month.  2. I will start Prilosec 20 mg daily and Claritin 10 mg daily and we      will ask her to start using her Flonase 2 sprays each nostril daily  on a standing basis to see if these interventions impact her cough.  3. I will follow with her in one month to review the results.     Leslye Peer, MD  Electronically Signed    RSB/MedQ  DD: 03/04/2007  DT: 03/04/2007  Job #: [id number]   cc:   Luis Abed, MD, [facility]  Willow Ora, MD

## 2010-10-11 NOTE — Assessment & Plan Note (Signed)
[facility]                            CARDIOLOGY OFFICE NOTE   Virginia Price                       MRN:          [id number]  DATE:07/08/2007                            DOB:          [birth year]    Ms. Virginia Price is doing well.  She is here with her neighbor, and she is  doing quite well.  She is 77 years of age.  She is ambulating without  difficulty.  She has no chest pain.  She does not feel any significant  palpitations.  She is not having significant shortness of breath.  I saw  her in August 2008.  At that time, she was stable.  Since that time, she  has seen Dr. Delton Coombes in follow-up and has had a follow-up CT scan.  That  report is in the chart.  The report states that she has many small  nodules.  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  Jean Rosenthal of radiology and Dr. Delton Coombes.  The patient knows that she is  stable and needs no other workup at this time.   PAST MEDICAL HISTORY:   ALLERGIES:  PENICILLIN, SULFA, SEPTRA AND QUESTION PREDNISONE.   MEDICATIONS:  1. Aspirin 81.  2. Coumadin.  3. Multivitamin.  4. Bupropion 100 b.i.d.  5. Metoprolol 25.  6. Lanoxin 0.125.  7. Simvastatin.  8. Loratadine.   OTHER MEDICAL PROBLEMS:  See the list below.   REVIEW OF SYSTEMS:  Her review of systems really is negative.  She has  no complaints today.   PHYSICAL EXAMINATION:  Weight is 139 pounds.  Blood pressure is 137/69  with a pulse of 66.  The patient is oriented to person, time and place.  Affect is normal.  HEENT:  Reveals no xanthelasma.  She has normal extraocular motion.  There are no carotid bruits.  There is no jugular venous distention.  LUNGS:  Are clear.  Respiratory effort is not labored.  CARDIAC EXAM:  Reveals a S1-S2.  The rhythm is irregularly irregular.  There are no significant murmurs.  ABDOMEN:  Is soft.  She has no peripheral edema.   EKG  reveals atrial fibrillation with a controlled ventricular response.   PROBLEMS INCLUDE:  1. History of multiple drug allergies as mentioned above.  2. Status post cholecystectomy, hysterectomy and appendectomy.  3. History of cataract surgery and lens implants.  4. Gastroesophageal reflux disease with an esophageal stricture      dilated in the past.  5. Question of an infarct in the past but normal coronaries by      catheterization in 2004 and normal left ventricular function.  6. Cardiolite with no ischemia in 2006.  7. History of hypertension.  8. Coumadin therapy for her atrial fibrillation.  9. History of a cerebral vascular accident in the past.  10.Chronic atrial fibrillation.  Her rate is controlled.  I will not      adjust her medicines at this point.  11.Chest x-ray abnormality as described above.   The patient is well  treated and needs to remain on Coumadin.     Luis Abed, MD, [facility]  Electronically Signed    JDK/MedQ  DD: 07/08/2007  DT: 07/09/2007  Job #: [id number]   cc:   Willow Ora, MD

## 2010-10-11 NOTE — Assessment & Plan Note (Signed)
[facility]                            CARDIOLOGY OFFICE NOTE   GEETIKA, LABORDE                       MRN:          [id number]  DATE:06/18/2008                            DOB:          05-20-1926    Ms. Virginia Price is doing well.  She is now 77 years of age.  She has chronic  atrial fibrillation.  She is on Coumadin.  Recently at the time of a  colonoscopy, her Coumadin was held and she appropriately received  Lovenox because of a history of a CVA.  Ongoing work is being done to  get her back fully on a stable dose of Coumadin.  She has not had any  chest pain.  She has no syncope or presyncope.  She does not have any  significant palpitations.   PAST MEDICAL HISTORY:   ALLERGIES:  PENICILLIN, SULFA, SEPTRA, and question of PREDNISONE.   MEDICATIONS:  See the flow sheet and EMR.  Her meds include Coumadin.   OTHER MEDICAL PROBLEMS:  See the list below.   REVIEW OF SYSTEMS:  She is not having any significant GI or GU symptoms  now.  She had a colonoscopy that could not be completed and therefore,  she had a barium enema and now there is a question of another study  needed.  She has no fevers or chills or skin rashes.  Otherwise, her  review of systems is negative.   PHYSICAL EXAMINATION:  VITAL SIGNS:  Blood pressure is 130/76 with a  pulse of 60.  GENERAL:  The patient is oriented to person, time, and place.  Affect is  normal.  HEENT:  No xanthelasma.  She has normal extraocular motion.  NECK:  There are no carotid bruits.  There is no jugular venous  distention.  LUNGS:  Clear.  Respiratory effort is not labored.  CARDIAC:  S1 with an S2.  There are no clicks or significant murmurs.  ABDOMEN:  Soft.  EXTREMITIES:  She has no significant peripheral edema.   EKG reveals atrial fib with a controlled ventricular response.   Problems include,  1. History of drug allergies as mentioned above.  2. Status post cholecystectomy, hysterectomy, and  appendectomy.  3. History of cataract surgery and lens implant.  4. Gastroesophageal reflux disease with esophageal stricture dilated      in the past.  5. Question of an infarct in the past, but normal coronary arteries by      cath in 2004 and normal left ventricular function.  6. No ischemia by Cardiolite 2006.  7. Hypertension treated.  8. History of atrial fibrillation.  This is chronic.  She is on      Coumadin.  Rate is controlled.  9. History of a cerebrovascular accident.  The patient had seen Dr.      Marca Ancona in 2005.  It is my understanding that the patient had a      left temporal stroke in the middle cerebral artery distribution.  10.Chest x-ray with a pulmonary abnormality that has been assessed by      Dr. Delton Coombes over  time.  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   The patient is stable.  No change in her meds.  I will see her back in 1  year for cardiology followup.     Luis Abed, MD, [facility]  Electronically Signed    JDK/MedQ  DD: 06/18/2008  DT: 06/18/2008  Job #: [id number]   cc:   Willow Ora, MD

## 2010-10-14 NOTE — Discharge Summary (Signed)
[name], Virginia [name]                ACCOUNT NO.:  [id number]   MEDICAL RECORD NO.:  [id number]          PATIENT TYPE:  INP   LOCATION:  3714                         FACILITY:  [facility]   PHYSICIAN:  Bettey Mare. [place], NPDATE OF BIRTH:  03/04/1926   DATE OF ADMISSION:  06/17/2006  DATE OF DISCHARGE:                               DISCHARGE SUMMARY   ADDENDUM:   TIME SEEN:  Time spent with the patient on discharge to include  physician time was 40 minutes.      Bettey Mare. Lyman Bishop, NP     KML/MEDQ  D:  06/19/2006  T:  06/19/2006  Job:  [id number]

## 2010-10-14 NOTE — Assessment & Plan Note (Signed)
[facility]                              CARDIOLOGY OFFICE NOTE   LARRIE, LUCIA                       MRN:          [id number]  DATE:12/08/2005                            DOB:          07-12-1925    Ms. [place] [name] is doing well.  See my note of Sep 27, 2005.  At that time we  decided to keep her off Cardizem.  She is seen back today to make sure she  is stable with this.  I believe she is doing well overall.   PAST MEDICAL HISTORY:   ALLERGIES:  PENICILLIN, SULFA, SEPTRA, and question SEPTRA.   MEDICATIONS:  1.  Digitek 0.125.  2.  Aspirin.  3.  Coumadin as directed.  4.  Multivitamin.  5.  Wellbutrin.  6.  Propranolol 10 b.i.d.  7.  Vytorin 10/20.   PHYSICAL EXAMINATION:  VITAL SIGNS:  Blood pressure is 112/76 and her pulse  is 65.   EKG reveals atrial fibrillation with a rate controlled at 65.   Problems are previously listed on my notes.  Her atrial fibrillation rate is  controlled.  No change in her medications.  I will see her in six months or  as needed.  She will follow with Dr. Drue Novel.                               Luis Abed, MD, [facility]    JDK/MedQ  DD:  12/08/2005  DT:  12/08/2005  Job #:  [id number]   cc:   Willow Ora, MD

## 2010-10-14 NOTE — H&P (Signed)
NAME:  Virginia [name], Virginia [name]                          ACCOUNT NO.:  [id number]   MEDICAL RECORD NO.:  [id number]                   PATIENT TYPE:  INP   LOCATION:  1826                                 FACILITY:  [facility]   PHYSICIAN:  Willa Rough, M.D.                  DATE OF BIRTH:  July 29, 1925   DATE OF ADMISSION:  03/17/2003  DATE OF DISCHARGE:                                HISTORY & PHYSICAL   HISTORY OF PRESENT ILLNESS:  The patient is here with chest pain today.  Reportedly she had an MI in 66.  She was treated medically after cath at  [facility].  She has been quite active.  She is on Coumadin for  paroxysmal atrial fibrillation.  Today after working in her yard she  developed some severe chest pain while walking. It was the worse pain she  has ever had.  The was some shortness of breath.  She took a nitro of her  neighbor's at home and did not appear to get significant relief.  She called  EMS and is now here for further evaluation.  She is now pain-free.   ALLERGIES:  PENICILLIN, SULFA and SEPTRA.   MEDICATIONS:  1. Coumadin 2.5 mg.  2. Toprol  12.5 b.i.d.  3. Digitek 0.125.  4. Aspirin 81.  5. Wellbutrin 150.  6. Diltiazem 120.  7. Crestor 10.  8. Multivitamin.   OTHER MEDICAL PROBLEMS:  See the complete list below.   SOCIAL HISTORY:  [place]  She is  widowed and retired.  She never smoked.   FAMILY HISTORY:  There is a family history of coronary disease.   REVIEW OF SYSTEMS:  There have been no fevers or chills.  She has had no  HEENT problems.  There have been no major skin rashes.  Her chest pain has  returned today.  She has no GU symptoms.  She does have a history of some  hemorrhoids and GERD.  She has no major musculoskeletal problems and the  remainder of her review of systems is negative.   PHYSICAL EXAMINATION:  VITAL SIGNS:  The patient's temperature 98.4 with a  pulse of 56, respirations 18 and blood pressure  is 110/60.  GENERAL APPEARANCE:  The patient is in no distress at this time.  LUNGS:  Lungs are clear.  NECK:  Neck reveals no significant bruits.  HEENT:  Reveals no significant abnormalities.  HEART:  Cardiac exam reveals an S1 with an S2, but no clicks or significant  murmurs.  ABDOMEN:  The patient's abdomen is benign.  GENITOURINARY AND RECTAL:  Deferred, but actually a very gently rectal was  done with a small amount of stool and it was guaiac negative.  She does have  soft femoral bruits.  NEUROLOGIC EXAMINATION:  Neurologically she is grossly intact.   LABORATORY DATA:  EKG shows some  ST scooping .  Digoxin level normal.  Chest  x-ray reveals no active disease.  There is some mild generalized  peribronchial thickening.  BUN is 11, creatinine 0.8 and her first set of  contact enzymes are negative time three.  I do not have her INR yet.   PROBLEM LIST:  Problems include:  1. Stroke in October 2003 treated with Plavix at that time.  2. Paroxysmal atrial fibrillation and now she is on Coumadin for this,     although she is holding sinus rhythm at this time.  3. History of depression.  4. Status post hysterectomy and cholecystectomy.  5. Allergies to PENICILLIN and SULFA.  6. Hypercholesterolemia on medication.  7. History of coronary disease with a catheterization done in the past at     [facility].  8. Current chest discomfort.  We are concerned that this is unstable angina.     She is feeling better now.   PLAN:  The plan will be to admit her and to adjust her medications.  Her  Coumadin will be held and her INR allowed to drop, and heparin will be  started when appropriate.  We will proceed with catheterization during this  admission.  The patient and her family agree.                                                  Willa Rough, M.D.    Cleotis Lema  D:  03/17/2003  T:  03/18/2003  Job:  [id number]   cc:   Willow Ora, M.D.  [facility]  Office   Dr. Stormy Card [facility]

## 2010-10-14 NOTE — Discharge Summary (Signed)
Virginia [name], Virginia [name]                ACCOUNT NO.:  [id number]   MEDICAL RECORD NO.:  [id number]          PATIENT TYPE:  INP   LOCATION:  3714                         FACILITY:  [facility]   PHYSICIAN:  Madolyn Frieze. Jens Som, MD, FACCDATE OF BIRTH:  [birth year]   DATE OF ADMISSION:  06/17/2006  DATE OF DISCHARGE:  06/19/2006                               DISCHARGE SUMMARY   PRIMARY CARDIOLOGIST:  Dr. Willa Rough.   PRIMARY CARE PHYSICIAN:  Dr. Willow Ora.   PRIMARY DIAGNOSIS:  Atrial fibrillation with rapid ventricular response.   SECONDARY DIAGNOSES:  1. Hyperlipidemia.  2. Questionable history of myocardial infarction with catheterization      in 2004 revealing normal coronary arteries and normal left      ventricular function.  3. History of cerebrovascular accident.   HISTORY OF PRESENT ILLNESS:  This is a 77 year old Caucasian female,  patient of Dr. Willa Rough, with a past medical history of atrial  fibrillation and on Coumadin.  The patient was seen secondary to atrial  fibrillation with rapid ventricular response.  On admission, the patient  was found to have a heart rate of 175 beats per minute.  This was  reported per CardioNet.  The patient was admitted for medication  adjustment and monitoring of response.   On admission, the patient was started on Lopressor 25 mg b.i.d.  She  denied any complaints of chest pain, shortness of breath, dizziness or  discomfort in anyway with the elevated heart rate.  The patient did  admit to dyspnea with extreme exertion, but not on routine activities.   The patient's medication regimen was continued with the exception of  addition of Lopressor 25 mg b.i.d.  Her heart rate was monitored  throughout hospital course and heart rate returned to more normal rate  in 80s.  The patient was asked to get up and walk around and see how her  heart rate did with activities.  Her heart rate remained in the 70s and  80s throughout with any ambulation.  The  patient did not complain of any  shortness of breath or chest discomfort while she was up walking.   The patient did have a chest x-ray completed revealing a 1.0 cm right  lung mid nodule and a followup CT scan was recommended.  CT scan was  completed on June 18, 2006 and it did show a dominant 9 mm right  middle lobe nodule.  This was associated with multiple smaller and less  well defined nodular densities throughout the right upper middle lobes.  The appearance was most likely related to an inflammatory process with  atypical mycobacterial being a leading consideration.  Dr. Jens Som  spoke with Dr. Delton Coombes, pulmonology, prior to the patient's discharge, and  was scheduled an outpatient appointment.  The patient was also to have  AFP sputum sample obtained prior to discharge with those results being  sent to Dr. Delton Coombes.   On day of discharge, patient was anxious to go home without any further  complaints.  It had been explained to her that she would need to have a  followup appointment with Dr. Delton Coombes secondary to the abnormality in the  chest x-ray and CT.  The patient verbalized understanding.   DISCHARGE MEDICATIONS:  1. Lopressor 25 mg twice a day.  2. Digoxin 0.125 mg daily.  3. Coumadin 5 mg p.o. on Mondays, 2.5 on other days.  4. Enteric-coated aspirin 81 mg daily.  5. Vytorin 10/20 daily.  6. Bupropion 100 mg twice a day.   ALLERGIES:  PENICILLIN AND SULFA.   DISCHARGE LABS:  Hemoglobin 14.1, hematocrit 42.3, white blood cell is  7.2, platelets 249.  PT 21.7, INR 1.8.  Sodium 136, potassium 3.9,  chloride 101, CO2 29, glucose 84, BUN 12, creatinine 0.81.  Thyroid  studies, TSH was 0.994.  LFTs were within normal limits.   VITAL SIGNS:  On discharge, blood pressure 111/66, heart rate irregular  at 85, respirations 14, temperature 96.0.   FOLLOWUP APPOINTMENTS AND PLANS:  1. The patient is to follow up with Dr. Delton Coombes on July 03, 2006 at      11:20 a.m.  The patient  is to have an induced sputum for AFP      culture to be sent to Dr. Kavin Leech office.  2. The patient has a previously scheduled appointment with Dr. Myrtis Ser on      July 16, 2006 as she to follow up with that at the previously      scheduled time.  3. The patient is to follow up with her primary care physician, Dr.      Willow Ora, as needed for medical management.  4. The patient has been discussed new medication of Lopressor, which      she is to take twice a day at home, prescription has been provided.      Bettey Mare. Lyman Bishop, NP      Madolyn Frieze. Jens Som, MD, [facility]  Electronically Signed    KML/MEDQ  D:  06/19/2006  T:  06/19/2006  Job:  [id number]   cc:   Willow Ora, MD  Leslye Peer, MD

## 2010-10-14 NOTE — Assessment & Plan Note (Signed)
[facility]                            CARDIOLOGY OFFICE NOTE   Virginia [name], RAETZ                       MRN:          [id number]  DATE:07/16/2006                            DOB:          August 29, 1925    I had seen the patient in the office on June 06, 2006.  At that time,  we arranged for a CardioNet monitor.  That monitor showed that she had a  burst of increased heart rate, and on this basis we actually put her in  the hospital.  This was overseen by Dr. Jens Som, and the patient  stabilized with adjustment of her medications.  While there, she had a  CT scan and a chest x-ray showing some abnormalities, and she has  already had outpatient followup with Dr. Delton Coombes.  She does not sense  rapid heartbeat.  She does have some chest discomfort at times.  This is  mild.  There is no radiation.  She has not had any syncope or pre-  syncope.   PAST MEDICAL HISTORY:  ALLERGIES:  PENICILLIN, SULFA, SEPTRA AND  QUESTION PREDNISONE.   MEDICATIONS:  1. Digitek 0.125.  2. Aspirin 81.  3. Coumadin multivitamin.  4. Vytorin 10/20.  5. Lopressor 25 b.i.d.  6. Digoxin.  7. Bupropion 100 mg b.i.d.   OTHER MEDICAL PROBLEMS:  See the list below.   REVIEW OF SYSTEMS:  Today, she is feeling pretty well.  Review of  systems otherwise is negative.   PHYSICAL EXAMINATION:  VITAL SIGNS:  Weight is 131 pounds, blood  pressure is 110/70 with a pulse of 77.  GENERAL:  Patient is oriented to person, time and place and is here with  her family member.  Affect is normal.  HEENT:  Reveals no xanthelasma.  She has normal extraocular motions.  There are no carotid bruits.  There is no jugular venous distention.  CARDIAC:  Reveals an S1 with an S2.  The rhythm is irregularly  irregular, but the rate is controlled.  There are no marked murmurs.  ABDOMEN:  Soft.  There are no masses or bruits.  She has no significant  peripheral edema.   EKG reveals atrial fibrillation  with a controlled ventricular response.   PROBLEMS:  1. HISTORY OF ALLERGIES TO PENICILLIN AND SULFA AND QUESTION      PREDNISONE.  2. Status post colonoscopy, hysterectomy and appendectomy.  3. History of cataract surgery and lens implants.  4. GERD with esophageal stricture that has been dilated in the past.  5. Normal LV function and normal coronary arteries by cath in 2004,      with despite a question of MI in the past.  6. Cardiolite with no ischemia in 2006.  7. Hypertension, treated.  8. Coumadin therapy for atrial fibrillation.  9. History of a cerebrovascular accident.  It is my understanding that      she was felt to have had a left temporal stroke in the middle      cerebral artery distribution.  10.Mild weakness.  11.Atrial fibrillation.  As outlined recently, her rate was higher  than we wanted, and her meds were adjusted, and this has been      stabilized.  12.Abnormality of her chest x-ray with a nodule.  She is following up      with Dr. Delton Coombes.   Cardiac status now is stable.  I will see her back in 6 months.   Patient's daughter asked why she is on both aspirin and Coumadin.  It is  my understanding that the aspirin is for the history of the CVA, and of  course the Coumadin is for her atrial fib.  I have carefully considered  whether or not she is a candidate for the rocket research trial, and at  this time I feel that she is not a good candidate for this.     Luis Abed, MD, [facility]  Electronically Signed    JDK/MedQ  DD: 07/16/2006  DT: 07/16/2006  Job #: [id number]   cc:   Willow Ora, MD

## 2010-10-14 NOTE — Cardiovascular Report (Signed)
   NAME:  Virginia [name], Virginia [name]                          ACCOUNT NO.:  [id number]   MEDICAL RECORD NO.:  [id number]                   PATIENT TYPE:  INP   LOCATION:  2035                                 FACILITY:  [facility]   PHYSICIAN:  Carole Binning, M.D. [facility]         DATE OF BIRTH:  Jun 26, 1925   DATE OF PROCEDURE:  03/20/2003  DATE OF DISCHARGE:                              CARDIAC CATHETERIZATION   PROCEDURE PERFORMED:  Left heart catheterization with coronary angiography,  left ventriculography.   INDICATION:  [name] is a 77 year old woman with reported history of  previous myocardial infarction.  She presented to the hospital with an  episode of severe substernal chest pain and was referred for cardiac  catheterization.   PROCEDURAL NOTE:  A 6 French sheath was placed in the right femoral artery.  Coronary angiography was performed with 6 [place] JL-4 and JR-4 catheters.  Left ventriculography was performed with an angled pigtail catheter.  Contrast was Omnipaque.  At the conclusion of the procedure, an Angio-Seal  vascular closure device was placed in the  right femoral artery with good  hemostasis.  There were no complications.   RESULTS:   HEMODYNAMICS:  1. Left ventricular pressure 132/12.  2. Aortic pressure 130/60.  3. There is no aortic valve gradient.   LEFT VENTRICULOGRAM:  Wall motion is normal.  Ejection fraction is estimated  at greater than or equal to 60%.  There is some mitral regurgitation  present, but this appears to be secondary to ventricular ectopy.   CORONARY ARTERIOGRAPHY (RIGHT DOMINANT):  Left main is normal.   Left anterior descending artery gives rise to a normal size diagonal branch.  The LAD is normal.   Left circumflex gives rise to a normal size branching first marginal and  small second marginal.  The left circumflex is normal.   Right coronary artery is a dominant vessel giving rise to a normal size  posterior descending artery and a  small posterior lateral branch.   The right coronary artery is normal.    IMPRESSION:  1. Normal left ventricular systolic function.  2. Normal coronary arteries.                                               Carole Binning, M.D. [facility]    MWP/MEDQ  D:  03/20/2003  T:  03/20/2003  Job:  [id number]   cc:   Wanda Plump, MD [facility]  [phone] [address] [place], [place] [address]

## 2010-10-14 NOTE — Assessment & Plan Note (Signed)
[facility]                             PULMONARY OFFICE NOTE   Virginia [name], Virginia [name]                       MRN:          [id number]  DATE:07/03/2006                            DOB:          03/18/1926    REASON FOR CONSULTATION:  This is a consultation arranged in hospital at  the request of Dr. Olga [name] for abnormal CT scan of the chest.   BRIEF HISTORY:  Virginia [name] is a 77 year old woman with a history of CVA,  coronary artery disease, atrial fibrillation, hypercholesterolemia,  allergic rhinitis.  She was recently in the hospital, followed by Dr.  Jens [name] for tachycardia.  She was started on metoprolol during this  hospitalization.  A chest x-ray was performed that showed a right middle  lobe nodule.  In retrospect, we learned that she has had a middle lobe  nodule measuring approximately 10 mm for many years, and this had been  followed previously by CAT scans.  A CT scan of the chest on January 21  to confirm stability of the nodule showed that it was unchanged in size,  but there were also some areas of scattered right upper lobe and middle  lobe nodular disease that was new compared with her most recent CT scan  from 2004.  She reports daily cough for the last few months that has  been nonproductive.  She also has some exertional shortness of breath.  She is able to walk at her own pace but does occasionally have to stop  if she overexerts herself.  She has occasional mid chest pain and  palpitations.  The palpitations have certainly improved since she began  metoprolol.  She complains of weakness, a loss of 17 pounds over about a  year and one-half and some occasional imbalance and dizziness when she  bends.  She has had some decreased appetite, some postnasal drip and  clear nasal drainage that appears to exacerbate a dry cough.   PAST MEDICAL HISTORY:  1. Coronary artery disease.  2. Tachycardia, paroxysmal atrial fibrillation.  3.  Hypercholesterolemia.  4. CVA 4 years ago with residual weakness.  5. Allergic rhinitis.  6. Cholecystectomy in 1991.  7. Hysterectomy in 1978.   ALLERGIES:  1. SULFA DRUGS.  2. PENICILLIN.   CURRENT MEDICATIONS:  1. Digoxin 0.125 mg daily.  2. Aspirin 81 mg daily.  3. Coumadin as directed.  4. Multivitamins once daily.  5. Vytorin 10/20 mg daily.  6. Lopressor 25 mg b.i.d.  7. Bupropion 100 mg b.i.d.  8. Nitroglycerin 0.4 mg sublingual p.r.n.   SOCIAL HISTORY:  The patient is widowed.  She is a never smoker.  She  does not have any significant occupational inhalational exposures.  She  has never been exposed to TB.   FAMILY HISTORY:  Significant for coronary artery disease and rheumatoid  disease.   REVIEW OF SYSTEMS:  Significant for those things mentioned in the HPI.  Also please note that she has had nasal congestion and difficulty  breathing through her nose and occasional earache that she relates to  her allergies.  PHYSICAL EXAMINATION:  GENERAL: This is a thin, well-appearing woman who  is in no distress.  VITAL SIGNS: Weight 130 pounds, height 5 feet 7 inches.  Temperature  97.3, blood pressure 118/68, heart rate 72 and regular.  SPO2 98% on  room air.  HEENT:  The oropharynx is narrow.  She has some posterior pharyngeal  erythema.  LUNGS:  Clear to auscultation bilaterally.  HEART: Regular without murmur.  ABDOMEN:  Benign.  EXTREMITIES:  No cyanosis, clubbing, or edema.  NEUROLOGIC:  She has a nonfocal exam.   CT scan of the chest performed on January 21 shows a 9 mm right middle  lobe nodule that is stable compared with old films from 2004.  She also  has some scatter right upper lobe and right middle lobe nodular disease  that appears to be new.   AFB smear from June 19, 2006, is negative, and the final culture is  pending.   IMPRESSION:  Abnormal CT scan with a stable right middle lobe 9 mm  nodule based on old reports, but there is also  scattered right upper  lobe and right middle lobe nodular disease which is probably new.  If  so, this may very well represent an atypical infection given her recent  history of weight loss, decreased appetite, shortness of breath, an  cough.  I feel it is less likely for this to be a malignancy.   PLAN:  1. I will plan to obtain her old CAT scans from [facility]      in order to compare them directly.  2. I will follow her AFB to completion.  3. She will follow up with me in 5 weeks after AFB cultures are      finished to review the results      and decide  if we will follow serial films versus attempt to obtain      a better sample with fiberoptic bronchoscopy.     Virginia Peer, MD  Electronically Signed    RSB/MedQ  DD: 08/14/2006  DT: 08/14/2006  Job #: [id number]   cc:   Virginia [name]. Virginia Som, MD, [facility]  Virginia Ora, MD

## 2010-10-14 NOTE — Assessment & Plan Note (Signed)
[facility]                                 ON-CALL NOTE   MATTESON, BLUE                         MRN:          [id number]  DATE:06/17/2006                            DOB:          April 14, 1926    PHONE NUMBER:  [phone].  Alternate phone number:  [phone].   CARDIOLOGIST:  Dr. Willa Rough.   HISTORY:  Ms. Virginia [name] is a 77 year old female followed by Dr. Myrtis Ser, with  a history of atrial fibrillation who recently had a cardiac monitor  placed.  I actually spoke to her yesterday.  I was called about some  high heart rates.  She was asymptomatic at that time.  The Cardio-Net  service called me again with high heart rates in the 160s.  She is  averaging a heart rate of 100 to 150.  I had asked her yesterday to  increase her propranolol to 10 mg 3 times a day.  I spoke to her again  today.  She continues to be asymptomatic without any shortness of breath  or near syncope.   PLAN:  I discussed the case with Dr. Jens Som.  We both agreed that the  patient should come to the emergency room today for further evaluation  to better treat her heart rate.  Initially, I was going to have her come  into the office tomorrow, however given her high heart rates she should  be seen today.  I explained this to the patient and she agrees to come  today.   DISPOSITION:  As noted above.      Tereso Newcomer, PA-C  Electronically Signed      Madolyn Frieze. Jens Som, MD, [facility]  Electronically Signed   SW/MedQ  DD: 06/17/2006  DT: 06/17/2006  Job #: [id number]

## 2010-10-14 NOTE — Assessment & Plan Note (Signed)
[facility]                            CARDIOLOGY OFFICE NOTE   Virginia [name], Virginia [name]                       MRN:          [id number]  DATE:06/06/2006                            DOB:          10-08-1925    Virginia [name] is seen for her atrial fibrillation.  Unfortunately, she is  having shortness of breath with exertion.  She is also feeling  palpitations at rest at night.  She has not had any syncope or  presyncope.  There is no chest pain.  She says that she feels weak in  general.  I am not convinced that this is related to her heart beat.  For a period of time I had been worried that she had a slow atrial fib  rate and because of this, I had taken her off of diltiazem.  During that  period I thought that she was somewhat better.  However now she is  having symptoms of shortness of breath and I do not know if this is  related to her atrial fib or not.   PAST MEDICAL HISTORY:   ALLERGIES:  PENICILLIN and SULFA.   MEDICATIONS:  1. Digitek 0.125.  2. Aspirin 81.  3. Coumadin as directed.  4. Multivitamin.  5. Wellbutrin.  6. Propranolol 10 b.i.d.  7. Vytorin 10/20.   OTHER MEDICAL PROBLEMS:  See the complete list below.   REVIEW OF SYSTEMS:  Other than the HPI her review of systems is  negative.   PHYSICAL EXAMINATION:  Blood pressure is 135/80 with pulse of 92.  The patient is oriented to person, time and place and her affect is  normal.  She is here with her daughter today.  There is no xanthelasma.  She has normal extraocular motion.  She has no carotid bruits.  There is no jugular venous distention.  LUNGS:  Are clear.  Respiratory effort is not labored.  CARDIAC:  Reveals an S1 with an S2.  The rhythm is irregularly  irregular.  There is a soft systolic murmur.  ABDOMEN:  Is soft.  There are no masses or bruits.  She has no significant peripheral edema.   EKG reveals atrial fibrillation with nonspecific ST-T wave changes.  Her  rate  on the EKG is 93.   IMPRESSION:  1. A history of allergy to PENICILLIN and SULFA.  2. Status post cholecystectomy, hysterectomy and appendectomy.  3. History of cataract surgery and lens implants.  4. Gastroesophageal reflux disease and esophageal stricture that is      dilated in the past.  5. A question of an infarct in the past, but normal coronaries by cath      in October 2004 and normal left ventricular function at that time.  6. A Cardiolite with no ischemia in 2006 with an ejection fraction of      74%.  7. History of hypertension.  8. Coumadin therapy for her atrial fib.  9. History of a cerebral vascular accident in the past.  A test by Dr.      Dicky Doe in 2005. I believe that she  had a left temporal stroke in      the middle cerebral artery distribution.  10.Ongoing weakness of unknown etiology.  11.Atrial fibrillation.   I have considered carefully the various approaches.  We raised the  question of whether or not we should proceed with cardioversion or  cardioversion with other anti-arrhythmic medicines.  I am not convinced  that this is the most prudent course at this point.  I have decided to  proceed with a CardioNet monitor.  We will try to obtain information  from various times of the day and get her to let us know when she is  having symptoms at night.  We will look at the range of her atrial  fibrillation.  Then I will try to decide if we can adjust medicines for  her atrial fib.  After that we will make further decisions.     Luis Abed, MD, [facility]  Electronically Signed    JDK/MedQ  DD: 06/06/2006  DT: 06/06/2006  Job #: [id number]   cc:   Willow Ora, MD

## 2010-10-14 NOTE — Discharge Summary (Signed)
NAME:  Virginia [name], Virginia [name]                          ACCOUNT NO.:  [id number]   MEDICAL RECORD NO.:  [id number]                   PATIENT TYPE:  INP   LOCATION:  2035                                 FACILITY:  [facility]   PHYSICIAN:  Willa Rough, M.D.                  DATE OF BIRTH:  1926-02-29   DATE OF ADMISSION:  03/17/2003  DATE OF DISCHARGE:  03/20/2003                           DISCHARGE SUMMARY - REFERRING   PROCEDURES:  Cardiac catheterization, March 20, 2003.   REASON FOR ADMISSION:  Please refer to dictated admission note.   LABORATORY DATA:  Cardiac enzymes:  CPK MB and troponin I markers normal.  INR 1.9 on admission; 1.6 at discharge.  Normal CBC on admission.  Potassium  3.4 on admission; 4.0 in follow up.  Normal renal function.  Normal liver  enzymes.  Lipid profile:  Total cholesterol 166, triglycerides 213, HDL 50,  LDL 73 (cholesterol/HDL ratio 3.3).  TSH 1.15.  Digoxin level 0.6.  Admission chest x-ray:  Mild generalized peribronchial thickening; no acute  disease.   HOSPITAL COURSE:  Following presentation to the emergency room with symptoms  worrisome for unstable angina pectoris, the patient was admitted for rule  out myocardial infarction and further diagnostic evaluation.  The patient  presented with a reported history of previous MI in 1990, with subsequent  diagnostic coronary angiogram at [facility] and recommended medical  therapy.  Following admission, the patient ruled out for MI with normal  cardiac markers.  Coumadin was placed on hold, and the patient was placed on  intravenous heparin overlap.  INR was allowed to drift down.  Once  subtherapeutic, the patient was clear to proceed with coronary angiogram.   Cardiac catheterization, performed by Dr. Daisey Must, (see report for  full details), revealed normal coronary arteries and a normal left ventricle  with no wall motion abnormalities.  Dr. Gerri Spore concluded that the  etiology of the  pain was non-cardiac, and the patient was cleared for  discharge later the same day.   MEDICATION ADJUSTMENTS THIS ADMISSION:  Addition of Protonix.   DISCHARGE MEDICATIONS:  1. Coumadin as previously directed.  2. Toprol-XL 12.5 mg b.i.d.  3. Digitek 0.125 mg daily.  4. Aspirin 81 mg daily.  5. Wellbutrin 150 mg daily.  6. Diltiazem 120 mg daily.  7. Crestor 10 mg daily.  8. Protonix 40 mg daily.   INSTRUCTIONS:  1. No heavy lifting or driving times two days.  2. She is to call the office if there is any swelling/bleeding in the groin.  3. The patient is instructed to check a follow up pro time on Monday,     March 23, 2003, at [facility].   FOLLOW UP:  1. The patient is discharged to arrange follow up with her primary care     physician, Dr. Drue Novel, in the ensuring 1-2 weeks.  2. She will  return to Dr. Willa Rough as previously scheduled.   DISCHARGE DIAGNOSES:  1. Noncardiac chest pain.     a. Normal coronary arteries/left ventricle - cardiac catheterization,        March 20, 2003.     b. History of previous myocardial infarction in 1990 [facility]).  2. History of paroxysmal atrial fibrillation.     a. Chronic Coumadin.  3. History of stroke.     a. Previously treated with Plavix.  4. Dyslipidemia.      Gene Serpe, P.A. [facility]                      Willa Rough, M.D.    GS/MEDQ  D:  03/20/2003  T:  03/20/2003  Job:  [id number]   cc:   Wanda Plump, MD [facility]  [phone] [place], [place] [address]

## 2010-10-14 NOTE — Assessment & Plan Note (Signed)
[facility]                                 ON-CALL NOTE   SHANTELLE, ALLES                         MRN:          [id number]  DATE:06/17/2006                            DOB:          [birth year]    PHONE NUMBER:  [phone].   SUBJECTIVE:  Virginia [name], from [employer], called me about Virginia [name].  She has  a history of atrial fibrillation/flutter and Dr. Myrtis Ser recently placed a  CardioNet monitor to better assess her rhythm and rate.  She has had  rates ranging from 90's up to the 170's.  She did have greater than 60  seconds of heart rate of 175.  The tech from [employer] told me that  these were asymptomatic.  I contacted the patient at home.  She notes  that she feels fine.  She is doing her normal activities of daily living  without difficulty.  She denies shortness of breath, near syncope or  fatigue.  She is on propranolol 10 mg twice a day as well as digoxin  0.125 mg daily.  She checks her blood pressure at home.  It was recently  118/78.  Her blood pressure was 135/80 at her last office visit with Dr.  Myrtis Ser.   PLAN:  The patient tells me in the past that Dr. Myrtis Ser has told her that  she could probably go up to propranolol three times a day if needed.  I  have asked her to go ahead and increase it to 10 mg three times a day  and monitor her blood pressures.  If she begins to feel fatigued with  this she is to contact us.  If she begins to feel symptomatic with her  atrial fibrillation, with shortness of breath, near syncope or fatigue  she is to call us.  I have left a message at the office for somebody to  contact her Monday so that she could come in for an appointment.   DISPOSITION:  As noted.      Tereso Newcomer, PA-C  Electronically Signed      Bevelyn Buckles. Bensimhon, MD  Electronically Signed   SW/MedQ  DD: 06/17/2006  DT: 06/17/2006  Job #: [id number]

## 2010-10-14 NOTE — H&P (Signed)
[name], [name]                ACCOUNT NO.:  [id number]   MEDICAL RECORD NO.:  [id number]          PATIENT TYPE:  EMS   LOCATION:  MAJO                         FACILITY:  [facility]   PHYSICIAN:  Madolyn Frieze. Jens Som, MD, FACCDATE OF BIRTH:  1925/08/22   DATE OF ADMISSION:  06/17/2006  DATE OF DISCHARGE:                              HISTORY & PHYSICAL   HISTORY:  Virginia [name] is a 77 year old female with a past medical  history of atrial fibrillation, Coumadin use, hyperlipidemia whom we are  admitting for atrial fibrillation with a rapid ventricular response.  The patient does have a history of myocardial infarction by her report,  but previous cardiac catheterization in October of 2004 revealed normal  coronary arteries and normal LV function.  She has been followed by Dr.  Myrtis Ser for her atrial fibrillation and was last seen in January of this  year on the 9th.  A CardioNet monitor was ordered at that time secondary  to weakness and mild dyspnea on exertion.  Last evening, Virginia Newcomer,  PA-C, was contacted by the CardioNet monitoring service and the  patient's heart rate was noted to be 150-160.  He increased the  propranolol that she normally takes to 10 mg p.o. t.i.d.  However, he  was again contacted today with a heart rate in the 160 range and ranging  in 100-150 range normally.  She was therefore asked to come to the  emergency room.  Note:  She does have dyspnea with more extreme  exertion, but now with routine activities.  There is no orthopnea, PND  or pedal edema.  There is no exertional chest pain.  She does  occasionally have palpitations predominantly at night.  There has been  no syncope.   MEDICATIONS:  Her medications include:  1. Digoxin 0.125 mg p.o. daily.  2. Aspirin 81 mg p.o. daily.  3. Coumadin as directed.  4. Multivitamin.  5. Wellbutrin 100 mg p.o. b.i.d.  6. Propranolol 10 mg p.o. t.i.d.  7. Vytorin 10 per 20 mg tablets 1 p.o. nightly.   ALLERGIES:  SHE  HAS AN ALLERGY TO PENICILLIN AND SULFA.   SOCIAL HISTORY:  She does not smoke, nor does she consume alcohol.  She  does live alone.   FAMILY HISTORY:  Her family history is positive for coronary artery  disease.   PAST MEDICAL HISTORY:  Her past medical history is significant for  hyperlipidemia, but there is no diabetes mellitus or hypertension by her  report.  She does have a history of atrial fibrillation as described in  the HPI and questionable myocardial infarction.  There is also a prior  history of stroke.  She has a history of cholecystectomy, hysterectomy  and appendectomy.  She has also had previous cataract surgery and  gastroesophageal reflux disease.   REVIEW OF SYSTEMS:  She denies any headache or fevers or chills.  There  is no productive cough or hemoptysis.  There is no dysphagia,  odynophagia, melena or hematochezia.  There is no dysuria or hematuria.  There is no seizure activity.  There is no orthopnea  or PND.  She does  have some weakness.  She also has some unsteadiness with her gait.  The  remaining systems are negative.   PHYSICAL EXAMINATION:  VITAL SIGNS:  Her physical exam today shows a  blood pressure of 141/83 and her pulse is 88.  She is afebrile.  GENERAL:  She is well-developed and well-nourished.  No acute distress.  SKIN:  Her skin is warm and dry.  She is not __________.  HEENT:  Unremarkable, with normal eyelid.  BACK:  Her back is normal.  NECK:  Her neck is supple, with normal upstroke bilaterally.  Cannot  appreciate bruits.  There is no jugular venous distention.  I could not  appreciate thyromegaly.  CHEST:  Her chest is clear to auscultation with normal expansion.  CARDIOVASCULAR:  Her cardiovascular exam reveals an irregular rhythm.  There are no murmurs, rubs or gallops.  Her rate is regular.  Her PMI is  nondisplaced.  ABDOMEN:  Abdominal exam is nontender.  She had positive bowel sounds.  No hepatosplenomegaly.  No mass appreciated.   There is no abdominal  bruit.  She has 2+ femoral pulses bilaterally.  No bruits.  EXTREMITIES:  Extremities show no edema that I could palpate.  She has  2+ dorsalis pedis pulses bilaterally.  NEUROLOGIC:  The neurological exam is grossly intact.   LABORATORY DATA:  Her electrocardiogram in the emergency room shows  atrial fibrillation at a rate of 77.  The axis is normal.  There are  nonspecific ST changes.   DIAGNOSES:  1. Atrial fibrillation, duration unknown.  2. Rapid ventricular response.  3. Coumadin therapy.  4. Hyperlipidemia.  5. Question history of myocardial infarction with catheterization in      2004 revealing normal coronary arteries and normal left ventricular      function.  6. History of cerebrovascular accident.   PLAN:  Virginia [name] presents with atrial fibrillation with rates as high  as 160.  We will plan to admit to telemetry for monitoring and we will  continue with the present dose of digoxin.  I will discontinue her  propranolol and we will begin Lopressor 25 mg p.o. b.i.d.  We can  increase this as needed for heart rate control.  After we have fully  titrated, I would recommend changing to Toprol for once daily dosing.  Note:  We would need to control her heart rate as, if she runs in the  160 range long term, she would be at risk for developing a tachycardia-  mediated cardiomyopathy.  I will plan to repeat an echocardiogram to  reassess her LV function and also check a TSH. The patient is having  some instability with walking.  We discussed the risks of falling on  Coumadin and suggested using a cane.  I will leave this to Dr. Myrtis Ser to  follow in the future.  If she continues to have unsteadiness or any  evidence of problems falling, then she may need to have her Coumadin  discontinued as the risks may outweigh the benefits.  I do not think she  is at that point at this time.  We will also obtain further input from Dr. Myrtis Ser in the morning.  Also of note  when her strips were sent in from  the CardioNet monitor, it appeared that it may be atrial flutter and  this would certainly benefit from ablation.  However, her  electrocardiogram here shows atrial fibrillation and therefore this  would not be  beneficial.      Madolyn Frieze. Jens Som, MD, [facility]  Electronically Signed     BSC/MEDQ  D:  06/17/2006  T:  06/17/2006  Job:  [id number]

## 2010-10-14 NOTE — Assessment & Plan Note (Signed)
[place] [facility]                             PULMONARY OFFICE NOTE   ADESSA, PRIMIANO                       MRN:          [id number]  DATE:08/14/2006                            DOB:          09/11/1925    SUBJECTIVE:  Virginia [name] is a pleasant, 78 year old woman with coronary  artery disease, atrial fibrillation, allergic rhinitis and history of  CVA.  She was seen in hospital for an abnormal chest x-ray and CT scan  of the chest and then subsequently in early February.  She returns today  to discuss her AFB results and to plan for further testing.  She tells  me that since our last visit, her cough is about the same.  It remains  dry.  She does cough every day and continues to have post nasal drip  which is more bothersome right now given the pollen season.  Her weight  is up 6 pounds and her appetite has improved.  She does not have any new  breathing complaints.   MEDICATIONS:  1. Digoxin 0.125 mg daily.  2. Aspirin 81 mg daily.  3. Coumadin as directed.  4. Multivitamin once daily.  5. Vytorin 10/20 mg daily.  6. Metoprolol 25 mg b.i.d.  7. Bupropion 100 mg b.i.d.  8. Nitroglycerin sublingual 0.4 mg p.r.n.   OBJECTIVE:  GENERAL:  This is a pleasant, thin woman who is in no  distress.  VITAL SIGNS:  Weight 136 pounds, temperature 97.7, blood pressure  128/80, heart rate 96, SPO2 99% on room air.  LUNGS:  Clear to auscultation bilaterally.  Her exam is otherwise  completely unchanged from our previous visit.   LABORATORY DATA AND X-RAY FINDINGS:  Her sputum AFB smear which was  negative on June 19, 2006, has been followed to completion and has  shown no growth.   IMPRESSION:  1. Nodular disease on CT scan of the chest that appears to be new when      compared with her CAT scan from 2004.  She has minimal symptoms      and, in fact, her decreased appetite and weight loss have improved      since our last visit.  She does continue to  cough.  2. Allergic rhinitis with associated cough.   PLAN:  1. Virginia [name] and I discussed the pros and cons of following this with      serial CT scans versus a more aggressive approach, fiberoptic      bronchoscopy and bronchoalveolar lavage to look for atypical      infection.  At this time, she would like to defer bronchoscopy and      we will plan to repeat her CT scan in July 2008.  I have      underscored with Virginia [name] that we will need to repeat her CT and      probably perform bronchoalveolar lavage sooner should she evolve      any new symptoms.  2. I have asked Virginia [name] to start taking her nasal steroid daily.      She has  been using this only on an as needed basis.  She will      continue to use it on a schedule through the allergy season.  3. I will follow up with Virginia [name] in July, after her CT scan is      performed to discuss the results and plan any necessary workup.      She will see me sooner if pulmonary symptoms evolve.     Leslye Peer, MD  Electronically Signed    RSB/MedQ  DD: 08/14/2006  DT: 08/14/2006  Job #: [id number]   cc:   Madolyn Frieze. Jens Som, MD, [facility]  Willow Ora, MD

## 2010-10-25 ENCOUNTER — Telehealth: Payer: Self-pay | Admitting: Internal Medicine

## 2010-10-25 MED ORDER — SIMVASTATIN 40 MG PO TABS: 40.0000 mg | ORAL_TABLET | Freq: Every day | ORAL | Status: DC

## 2010-10-25 NOTE — Telephone Encounter (Signed)
Med already sent in

## 2010-10-26 ENCOUNTER — Encounter: Payer: Self-pay | Admitting: Internal Medicine

## 2010-10-26 ENCOUNTER — Ambulatory Visit (INDEPENDENT_AMBULATORY_CARE_PROVIDER_SITE_OTHER): Payer: Medicare Other | Admitting: Internal Medicine

## 2010-10-26 VITALS — BP 128/86 | HR 93 | Temp 97.5°F | Wt 155.2 lb

## 2010-10-26 DIAGNOSIS — Z79899 Other long term (current) drug therapy: Secondary | ICD-10-CM

## 2010-10-26 DIAGNOSIS — R109 Unspecified abdominal pain: Secondary | ICD-10-CM

## 2010-10-26 DIAGNOSIS — I10 Essential (primary) hypertension: Secondary | ICD-10-CM

## 2010-10-26 DIAGNOSIS — I4891 Unspecified atrial fibrillation: Secondary | ICD-10-CM

## 2010-10-26 DIAGNOSIS — R0602 Shortness of breath: Secondary | ICD-10-CM

## 2010-10-26 DIAGNOSIS — R1030 Lower abdominal pain, unspecified: Secondary | ICD-10-CM

## 2010-10-26 DIAGNOSIS — Z7901 Long term (current) use of anticoagulants: Secondary | ICD-10-CM

## 2010-10-26 DIAGNOSIS — K589 Irritable bowel syndrome without diarrhea: Secondary | ICD-10-CM

## 2010-10-26 DIAGNOSIS — E785 Hyperlipidemia, unspecified: Secondary | ICD-10-CM

## 2010-10-26 LAB — POCT URINALYSIS DIPSTICK
Glucose, UA: NEGATIVE
Ketones, UA: NEGATIVE
Leukocytes, UA: NEGATIVE
Protein, UA: NEGATIVE
Urobilinogen, UA: 0.2

## 2010-10-26 NOTE — Patient Instructions (Addendum)
Coumadin, new dose: 2 mg every day Get your coumadin checked in 2 weeks  If the nose bleed came back , let us know for a ENT referal

## 2010-10-26 NOTE — Assessment & Plan Note (Signed)
Continue with meds, come back fasting

## 2010-10-26 NOTE — Assessment & Plan Note (Addendum)
Reports occasional diarrhea, recommend to see GI

## 2010-10-26 NOTE — Progress Notes (Signed)
  Subjective:    Patient ID: Virginia Price, female    DOB: Feb 07, 1926, 77 y.o.   MRN: [id number]  HPI Routine office visit, multiple issues: 4 few weeks she has noted her feet to swell up, "today they are okay" Complaining of tightness in the chest, not really pain , no cough, this is going on for 2 months, is hard for her to elaborate any further. In the last 2 weeks, he has several episodes of nosebleeds, usually when she bends over. Due for  an INR check  Past Medical History  Diagnosis Date  . Atrial fibrillation     Coumadin therapy  . Chest pain     Catheterization normal 2004, /  nuclear 2006 no ischemia  . Hyperlipidemia   . Hypertension   . Stroke   . Allergic rhinitis   . GERD (gastroesophageal reflux disease)   . Diverticular disease   . Colon polyp   . Abnormal chest CT     "innumerable small nodules  . Abnormal head CT 06/16/2010    atroph y  . Cataract     bilateral  . Drug therapy     Coumadin... atrial fibrillation  . Malignant melanoma   . Memory loss   . Restless leg syndrome   . CVA (cerebral infarction)     October, 2003.Marland Kitchen left middle cerebral artery territory   Past Surgical History  Procedure Date  . Cholecystectomy 1991  . Radical hysterectomy 1978  . Cataract extraction     bilateral  . Appendectomy   . Mohs surgery     03/2005      Review of Systems No chest pain per se. She does have shortness of breath on and off, can't tell if this is worse or better than before. She reports orthopnea. She also thinks that she is gaining weight, chart is reviewed, she weighed 154 pounds 2 months ago and is 155 pounds today. She has headaches on and off, nothing new. No ambulatory blood pressures. BP was okay this morning but the neurologist office. Continue with episodes of dizziness, saw neurology today. was noted to have some tenderness in the lower abdomen, chart is reviewed, this is a chronic finding, see GI note from 05-2009     Objective:   Physical Exam  Constitutional: She is oriented to person, place, and time. She appears well-developed and well-nourished. No distress.  HENT:  Head: Normocephalic and atraumatic.  Nose: Nose normal.  Neck: No JVD present. No thyromegaly present.  Cardiovascular:       Irregularly irregular  Pulmonary/Chest: Effort normal and breath sounds normal. No respiratory distress. She has no wheezes. She has no rales. She exhibits no tenderness.  Abdominal: Soft. Bowel sounds are normal. She exhibits no distension. There is no rebound and no guarding.       Mild bilateral lower abdominal discomfort  Musculoskeletal: She exhibits no edema.  Neurological: She is alert and oriented to person, place, and time.  Skin: She is not diaphoretic.  Psychiatric: She has a normal mood and affect. Her behavior is normal. Judgment and thought content normal.          Assessment & Plan:  Today , I spent more than 25 min with the patient, >50% of the time counseling,  reviewing the chart and assessing her sx

## 2010-10-26 NOTE — Assessment & Plan Note (Signed)
No change, check a BMP

## 2010-10-26 NOTE — Assessment & Plan Note (Addendum)
Seems stable however the patient complained of lower extremity edema, chest tightness, weight gain. Objectively, there is no JVD, no edema today, weight stable for the last 2 months. Chart reviewed her last echocardiogram was January 2012 (see below) EKG today -- afib w/o acute changes  Plan: BNP, rec to call if sx increase  ECHO   - Left ventricle: The cavity size was normal. Wall thickness was     normal. Systolic function was normal. The estimated ejection     fraction was in the range of 55% to 60%. Wall motion was normal;     there were no regional wall motion abnormalities. Left ventricular     diastolic function parameters were normal.   - Aortic valve: Trivial regurgitation.   - Mitral valve: Mild to moderate regurgitation.   - Left atrium: The atrium was mildly dilated.   - Right atrium: The atrium was mildly to moderately dilated.   - Pulmonary arteries: Systolic pressure was mildly increased. PA     peak pressure: 32mm Hg (S).

## 2010-10-26 NOTE — Assessment & Plan Note (Addendum)
INR elevated, currently on 16 mg weekly. New : 2 mg every day, 14 mg weekly. Recheck in 2 weeks.  see instructions Also reports epistaxis, will reduce dose of Coumadin and observe.

## 2010-10-27 ENCOUNTER — Encounter: Payer: Self-pay | Admitting: Internal Medicine

## 2010-10-27 LAB — CBC WITH DIFFERENTIAL/PLATELET
Basophils Absolute: 0 10*3/uL (ref 0.0–0.1)
Lymphocytes Relative: 26.3 % (ref 12.0–46.0)
Monocytes Relative: 8.4 % (ref 3.0–12.0)
Neutrophils Relative %: 63.3 % (ref 43.0–77.0)
Platelets: 214 10*3/uL (ref 150.0–400.0)
RDW: 15.9 % — ABNORMAL HIGH (ref 11.5–14.6)
WBC: 5.3 10*3/uL (ref 4.5–10.5)

## 2010-10-27 LAB — BASIC METABOLIC PANEL
BUN: 13 mg/dL (ref 6–23)
Calcium: 9.2 mg/dL (ref 8.4–10.5)
GFR: 63.63 mL/min (ref >60.00)
Glucose, Bld: 118 mg/dL — ABNORMAL HIGH (ref 70–99)
Sodium: 142 mEq/L (ref 135–145)

## 2010-10-28 ENCOUNTER — Telehealth: Payer: Self-pay | Admitting: *Deleted

## 2010-10-28 DIAGNOSIS — E8770 Fluid overload, unspecified: Secondary | ICD-10-CM

## 2010-10-28 DIAGNOSIS — I4891 Unspecified atrial fibrillation: Secondary | ICD-10-CM

## 2010-10-28 NOTE — Telephone Encounter (Signed)
Left message for pt.  Chest X-ray order put in.  Appointment w/ Dr.Katz is 11/09/10 @945  am-- if she gets worse before then she needs to come back and see Dr.Paz.

## 2010-10-28 NOTE — Telephone Encounter (Signed)
Message copied by Leanne Lovely on Fri Oct 28, 2010  1:51 PM ------      Message from: Virginia Price      Created: Fri Oct 28, 2010  1:13 PM       BNP slightly elevated, I checked all the systems, no previous BNP to compare with.      Plan:      Please order a chest x-ray, DX atrial fibrillation, ? Volume overload      Please arrange a visit with her cardiologist Dr. Myrtis Ser, ER if symptoms if sx  severe

## 2010-10-28 NOTE — Telephone Encounter (Signed)
agree

## 2010-10-28 NOTE — Telephone Encounter (Signed)
I spoke w/ pt she is aware, she will go have the Xray done on Monday.

## 2010-10-31 ENCOUNTER — Telehealth: Payer: Self-pay | Admitting: *Deleted

## 2010-10-31 ENCOUNTER — Ambulatory Visit (INDEPENDENT_AMBULATORY_CARE_PROVIDER_SITE_OTHER)
Admission: RE | Admit: 2010-10-31 | Discharge: 2010-10-31 | Disposition: A | Discharge status: Home / Self Care | Payer: Medicare Other | Source: Ambulatory Visit | Attending: Internal Medicine | Admitting: Internal Medicine

## 2010-10-31 DIAGNOSIS — E8770 Fluid overload, unspecified: Secondary | ICD-10-CM

## 2010-10-31 DIAGNOSIS — I4891 Unspecified atrial fibrillation: Secondary | ICD-10-CM

## 2010-10-31 DIAGNOSIS — E8779 Other fluid overload: Secondary | ICD-10-CM

## 2010-10-31 LAB — CULTURE, URINE COMPREHENSIVE: Colony Count: 25000

## 2010-10-31 MED ORDER — NITROFURANTOIN MONOHYD MACRO 100 MG PO CAPS: 100.0000 mg | ORAL_CAPSULE | Freq: Two times a day (BID) | ORAL | Status: AC

## 2010-10-31 NOTE — Telephone Encounter (Signed)
Pt is aware.  

## 2010-10-31 NOTE — Telephone Encounter (Signed)
Message copied by Leanne Lovely on Mon Oct 31, 2010  2:05 PM ------      Message from: Virginia Price      Created: Mon Oct 31, 2010  1:11 PM       Advise patient, urine culture returned positive.       Call in Macrobid 100 mg 1 by mouth twice a day for one week

## 2010-11-09 ENCOUNTER — Ambulatory Visit (INDEPENDENT_AMBULATORY_CARE_PROVIDER_SITE_OTHER): Payer: Medicare Other | Admitting: *Deleted

## 2010-11-09 ENCOUNTER — Ambulatory Visit: Payer: Medicare Other | Admitting: Cardiology

## 2010-11-09 DIAGNOSIS — Z7901 Long term (current) use of anticoagulants: Secondary | ICD-10-CM

## 2010-11-09 DIAGNOSIS — I4891 Unspecified atrial fibrillation: Secondary | ICD-10-CM

## 2010-11-09 NOTE — Patient Instructions (Signed)
Pt informed no change recheck in 3 weeks.

## 2010-11-10 ENCOUNTER — Other Ambulatory Visit: Payer: Self-pay | Admitting: Internal Medicine

## 2010-11-28 ENCOUNTER — Inpatient Hospital Stay (HOSPITAL_COMMUNITY)
Admission: AD | Admit: 2010-11-28 | Discharge: 2010-11-30 | DRG: 292 | Disposition: A | Discharge status: Home / Self Care | Payer: Medicare Other | Source: Other Acute Inpatient Hospital | Attending: Internal Medicine | Admitting: Internal Medicine

## 2010-11-28 ENCOUNTER — Ambulatory Visit (INDEPENDENT_AMBULATORY_CARE_PROVIDER_SITE_OTHER): Payer: Medicare Other | Admitting: Family Medicine

## 2010-11-28 ENCOUNTER — Telehealth: Payer: Self-pay | Admitting: Cardiology

## 2010-11-28 ENCOUNTER — Emergency Department (HOSPITAL_BASED_OUTPATIENT_CLINIC_OR_DEPARTMENT_OTHER): Payer: Medicare Other

## 2010-11-28 ENCOUNTER — Encounter: Payer: Self-pay | Admitting: Family Medicine

## 2010-11-28 ENCOUNTER — Emergency Department (HOSPITAL_BASED_OUTPATIENT_CLINIC_OR_DEPARTMENT_OTHER)
Admission: EM | Admit: 2010-11-28 | Discharge: 2010-11-28 | Disposition: A | Discharge status: Nursing Home (Includes ICF) | Payer: Medicare Other | Source: Home / Self Care | Attending: Emergency Medicine | Admitting: Emergency Medicine

## 2010-11-28 ENCOUNTER — Emergency Department (INDEPENDENT_AMBULATORY_CARE_PROVIDER_SITE_OTHER): Payer: Medicare Other

## 2010-11-28 DIAGNOSIS — R1310 Dysphagia, unspecified: Secondary | ICD-10-CM | POA: Diagnosis present

## 2010-11-28 DIAGNOSIS — I4891 Unspecified atrial fibrillation: Secondary | ICD-10-CM | POA: Diagnosis present

## 2010-11-28 DIAGNOSIS — R0789 Other chest pain: Secondary | ICD-10-CM

## 2010-11-28 DIAGNOSIS — F3289 Other specified depressive episodes: Secondary | ICD-10-CM | POA: Diagnosis present

## 2010-11-28 DIAGNOSIS — J449 Chronic obstructive pulmonary disease, unspecified: Secondary | ICD-10-CM

## 2010-11-28 DIAGNOSIS — R0602 Shortness of breath: Secondary | ICD-10-CM

## 2010-11-28 DIAGNOSIS — I509 Heart failure, unspecified: Secondary | ICD-10-CM | POA: Diagnosis present

## 2010-11-28 DIAGNOSIS — F329 Major depressive disorder, single episode, unspecified: Secondary | ICD-10-CM | POA: Diagnosis present

## 2010-11-28 DIAGNOSIS — I428 Other cardiomyopathies: Secondary | ICD-10-CM | POA: Diagnosis present

## 2010-11-28 DIAGNOSIS — I5033 Acute on chronic diastolic (congestive) heart failure: Principal | ICD-10-CM | POA: Diagnosis present

## 2010-11-28 DIAGNOSIS — R42 Dizziness and giddiness: Secondary | ICD-10-CM

## 2010-11-28 DIAGNOSIS — R609 Edema, unspecified: Secondary | ICD-10-CM

## 2010-11-28 DIAGNOSIS — K219 Gastro-esophageal reflux disease without esophagitis: Secondary | ICD-10-CM | POA: Diagnosis present

## 2010-11-28 DIAGNOSIS — E785 Hyperlipidemia, unspecified: Secondary | ICD-10-CM | POA: Diagnosis present

## 2010-11-28 DIAGNOSIS — R079 Chest pain, unspecified: Secondary | ICD-10-CM

## 2010-11-28 DIAGNOSIS — Z7901 Long term (current) use of anticoagulants: Secondary | ICD-10-CM

## 2010-11-28 DIAGNOSIS — Z8673 Personal history of transient ischemic attack (TIA), and cerebral infarction without residual deficits: Secondary | ICD-10-CM

## 2010-11-28 LAB — CBC
HCT: 34.6 % — ABNORMAL LOW (ref 36.0–46.0)
Hemoglobin: 11.8 g/dL — ABNORMAL LOW (ref 12.0–15.0)
RBC: 3.89 MIL/uL (ref 3.87–5.11)
WBC: 5.6 10*3/uL (ref 4.0–10.5)

## 2010-11-28 LAB — DIFFERENTIAL
Basophils Absolute: 0 10*3/uL (ref 0.0–0.1)
Lymphocytes Relative: 31 % (ref 12–46)
Monocytes Absolute: 0.5 10*3/uL (ref 0.1–1.0)
Neutro Abs: 3.2 10*3/uL (ref 1.7–7.7)

## 2010-11-28 LAB — CK TOTAL AND CKMB (NOT AT ARMC): Relative Index: INVALID (ref 0.0–2.5)

## 2010-11-28 LAB — TROPONIN I: Troponin I: <0.3 ng/mL (ref <0.30)

## 2010-11-28 LAB — BASIC METABOLIC PANEL
BUN: 15 mg/dL (ref 6–23)
GFR calc Af Amer: >60 mL/min (ref >60)
GFR calc non Af Amer: 60 mL/min — ABNORMAL LOW (ref >60)
Potassium: 4 mEq/L (ref 3.5–5.1)

## 2010-11-28 LAB — PRO B NATRIURETIC PEPTIDE: Pro B Natriuretic peptide (BNP): 1124 pg/mL — ABNORMAL HIGH (ref 0–450)

## 2010-11-28 LAB — CARDIAC PANEL(CRET KIN+CKTOT+MB+TROPI)
CK, MB: 1.7 ng/mL (ref 0.3–4.0)
Troponin I: <0.3 ng/mL (ref <0.30)

## 2010-11-28 NOTE — Telephone Encounter (Signed)
Pt advised to call Dr. Drue Novel her pcp today to be seen. Also to refill NTG prescription as it is "15-75 years old". She said Dr. Drue Novel would refill her NTG. Mylo Red RN

## 2010-11-28 NOTE — Telephone Encounter (Signed)
Pt calls today with "short-windedness" that awoke her at 3am.  She states she "did not want to call 911 or her daughter".  She did not take any nitroglycerin.  She is not having any chest tightness at this time.  Pt says she felt " stopped up" at 3 am.  Also reports increased swelling of feet that has been going on for 2 weeks but is able to "barely"  get her shoes on.  States her feet look like she has a blue blister on her foot where there is swelling.  She has not called her pcp.  Her weight has ranged from 154-150.

## 2010-11-28 NOTE — Assessment & Plan Note (Signed)
Pt reports chest 'tightness'- more than usual and associated w/ worsening SOB.  EKG unchanged but will need complete evaluation in ER.  Daughter in agreement- will take her.

## 2010-11-28 NOTE — Assessment & Plan Note (Signed)
Pt w/ bilateral LE edema, L>R that has not responded to Bumex and is now associated w/ chest tightness and SOB.  Will send to ER for CXR, BNP, and complete evaluation.  EKG unchanged but pt in Afib which may be contributing to volume overload.  Pt's daughter agrees to take her to ER.

## 2010-11-28 NOTE — Progress Notes (Signed)
  Subjective:    Patient ID: Virginia Price, female    DOB: 11-06-1925, 77 y.o.   MRN: [id number]  HPI Swelling of feet- sxs started ~1 month ago.  Has seen PCP and discussed this.  Legs will feel tight and throb when they are swollen.  sxs improve w/ elevation.  + SOB and chest tightness.  Reports SOB is more than baseline and worse when lying down.  + cough.  Also reports chest 'pressure'.  Thought about calling 9-1-1 but [place] i [place]'t like the idea of men coming to my house'.  Pt w/ hx of afib but pt and daughter report her heart is usually 'in rhythm'.  Denies hx of CHF.  No N/V, diaphoresis, leg pain, redness.  Pt was seen at [facility] for swelling last month and was started on Bumex w/out resolution of sxs.   Review of Systems For ROS see HPI     Objective:   Physical Exam  Constitutional: She is oriented to person, place, and time. She appears well-developed and well-nourished. No distress.  HENT:  Head: Normocephalic and atraumatic.  Neck: Normal range of motion. Neck supple. No thyromegaly present.  Cardiovascular:       Irregularly irregular S1/S2  Pulmonary/Chest: Effort normal. No respiratory distress. She has no wheezes. She has no rales.       Dry cough throughout visit Decreased BS at bases but no frank crackles  Abdominal: Soft. Bowel sounds are normal. She exhibits no distension.  Musculoskeletal: She exhibits edema (L>R, +2 pitting on L, +1 on R). She exhibits no tenderness.  Lymphadenopathy:    She has no cervical adenopathy.  Neurological: She is alert and oriented to person, place, and time.  Skin: Skin is warm and dry.          Assessment & Plan:

## 2010-11-28 NOTE — Patient Instructions (Signed)
Please go to the [facility] on [address] ([address] They will do a complete work-up and determine if you need to be transferred to [facility] Please follow up with cardiology as soon as possible Call with any questions or concerns Hang in there!

## 2010-11-28 NOTE — Assessment & Plan Note (Signed)
Pt in Afib today.  Uncertain if this is new for her but could be contributing to volume overload.  Will need CXR and BNP.  Will need to go to ER for this.

## 2010-11-28 NOTE — Telephone Encounter (Signed)
Pt has had chest pain since 3am and been waiting for the office to open so she could come in to Dr.Katz. She dose not have chest pain all the time and her feet are swollen and numbness in her fingers

## 2010-11-29 DIAGNOSIS — R0789 Other chest pain: Secondary | ICD-10-CM

## 2010-11-29 DIAGNOSIS — I4891 Unspecified atrial fibrillation: Secondary | ICD-10-CM

## 2010-11-29 LAB — TSH: TSH: 2.686 u[IU]/mL (ref 0.350–4.500)

## 2010-11-29 LAB — COMPREHENSIVE METABOLIC PANEL
Alkaline Phosphatase: 60 U/L (ref 39–117)
BUN: 15 mg/dL (ref 6–23)
CO2: 24 mEq/L (ref 19–32)
Calcium: 9.6 mg/dL (ref 8.4–10.5)
GFR calc Af Amer: >60 mL/min (ref >60)
GFR calc non Af Amer: >60 mL/min (ref >60)
Glucose, Bld: 101 mg/dL — ABNORMAL HIGH (ref 70–99)
Potassium: 3.4 mEq/L — ABNORMAL LOW (ref 3.5–5.1)
Total Protein: 6.8 g/dL (ref 6.0–8.3)

## 2010-11-29 LAB — CARDIAC PANEL(CRET KIN+CKTOT+MB+TROPI)
CK, MB: 1.6 ng/mL (ref 0.3–4.0)
Total CK: 66 U/L (ref 7–177)

## 2010-11-29 LAB — LIPID PANEL
Cholesterol: 190 mg/dL (ref 0–200)
VLDL: 38 mg/dL (ref 0–40)

## 2010-11-29 LAB — CBC
Platelets: 205 10*3/uL (ref 150–400)
RBC: 4.05 MIL/uL (ref 3.87–5.11)
WBC: 5.8 10*3/uL (ref 4.0–10.5)

## 2010-11-29 LAB — PROTIME-INR: Prothrombin Time: 18.7 seconds — ABNORMAL HIGH (ref 11.6–15.2)

## 2010-11-29 LAB — MAGNESIUM: Magnesium: 2 mg/dL (ref 1.5–2.5)

## 2010-11-30 LAB — BASIC METABOLIC PANEL
Calcium: 9.2 mg/dL (ref 8.4–10.5)
Chloride: 103 mEq/L (ref 96–112)
Creatinine, Ser: 0.93 mg/dL (ref 0.50–1.10)
GFR calc Af Amer: >60 mL/min (ref >60)
GFR calc non Af Amer: 58 mL/min — ABNORMAL LOW (ref >60)

## 2010-11-30 LAB — PROTIME-INR
INR: 1.8 — ABNORMAL HIGH (ref 0.00–1.49)
Prothrombin Time: 21.2 seconds — ABNORMAL HIGH (ref 11.6–15.2)

## 2010-11-30 LAB — PRO B NATRIURETIC PEPTIDE: Pro B Natriuretic peptide (BNP): 510.9 pg/mL — ABNORMAL HIGH (ref 0–450)

## 2010-12-02 ENCOUNTER — Other Ambulatory Visit: Payer: Self-pay | Admitting: Internal Medicine

## 2010-12-02 MED ORDER — WARFARIN SODIUM 2 MG PO TABS: 2.0000 mg | ORAL_TABLET | ORAL | Status: DC

## 2010-12-02 MED ORDER — DIGOXIN 125 MCG PO TABS: 125.0000 ug | ORAL_TABLET | Freq: Every day | ORAL | Status: DC

## 2010-12-02 MED ORDER — SIMVASTATIN 40 MG PO TABS: 40.0000 mg | ORAL_TABLET | Freq: Every day | ORAL | Status: DC

## 2010-12-02 NOTE — Telephone Encounter (Signed)
Sent in

## 2010-12-03 NOTE — Discharge Summary (Signed)
Virginia Price, DIGILIO NO.:  [id number]  MEDICAL RECORD NO.:  [id number]  LOCATION:  1416                         FACILITY:  [facility]  PHYSICIAN:  Hillery Aldo, M.D.   DATE OF BIRTH:  02/17/1926  DATE OF ADMISSION:  11/28/2010 DATE OF DISCHARGE:  11/30/2010                              DISCHARGE SUMMARY   PRIMARY CARE PHYSICIAN:  Willow Ora, MD  CARDIOLOGIST:  Luis Abed, MD, [facility]  PULMONOLOGIST:  Leslye Peer, MD  DISCHARGE DIAGNOSES: 1. Atypical chest pain, acute coronary syndrome ruled out. 2. Permanent atrial fibrillation. 3. Gastroesophageal reflux disease. 4. Acute-on-chronic diastolic congestive heart failure. 5. Cerebrovascular disease with history of stroke. 6. Dysphagia with history of esophageal stricture. 7. Depression. 8. Allergic rhinitis. 9. Dyslipidemia.  DISCHARGE MEDICATIONS: 1. Furosemide 20 mg p.o. daily. 2. Aspirin 81 mg p.o. daily. 3. Calcium carbonate/vitamin D OTC 1 tablet p.o. daily. 4. Coumadin 2 mg p.o. daily. 5. Fluticasone nasal spray 50 mcg 2 sprays intranasally daily p.r.n. 6. Lopressor 25 mg p.o. b.i.d. 7. Meclizine 12.5 mg p.o. b.i.d. 8. Multivitamin 1 tablet p.o. daily. 9. Nitroglycerin translingual spray 0.4 mg, 1 spray under tongue every     5 minutes p.r.n. chest pain. 10.Simvastatin 40 mg p.o. daily. 11.Wellbutrin 100 mg p.o. q.h.s.  Note:  Digoxin has been discontinued secondary to bradycardia.  CONSULTATIONS: 1. Thomas C. Wall, MD, [facility]. 2. Pharmacy for Coumadin dosing.  BRIEF ADMISSION HPI:  The patient is an 77 year old female with past medical history of coronary artery disease, normal ejection fraction, and prior CVA, who presented to the hospital with a chief complaint of chest pain and dyspnea.  Upon initial evaluation in the emergency department, the patient was found to have an elevated pro BNP level and findings on chest radiography of mild interstitial edema and  she subsequently was referred to the hospitalist service for concerns for congestive heart failure.  For full details, please see the dictated report done by Dr. Mikeal Hawthorne.  PROCEDURES AND DIAGNOSTIC STUDIES:  Chest x-ray on November 28, 2010 showed changes of COPD with mild increased interstitial accentuation, question mild interstitial edema.  DISCHARGE LABORATORY VALUES:  Pro BNP was down to 510.9.  Sodium was 141, potassium 3.7, chloride 103, bicarb 29, BUN 16, creatinine 0.93, glucose 105, calcium 9.2.  PT was 21.2 with an INR of 1.80.  TSH was 2.686.  Lipids showed a cholesterol of 190, triglycerides 192, HDL 48, LDL 104.  Cardiac markers were negative x3 sets.  HOSPITAL COURSE BY PROBLEM: 1. Chest pain:  The patient was admitted to the telemetry floor and     monitored closely with serial cardiac markers.  She was evaluated     by cardiology and reportedly had a normal cardiac catheterization     in the past.  It was felt that her chest pain could have been due     to musculoskeletal causes versus mild diastolic heart failure     exacerbation.  The patient is now chest pain free and her cardiac     markers have been consistently negative.  She has been cleared for     discharge by cardiology with no further  ischemic workup planned. 2. Permanent atrial fibrillation:  The patient is maintained on     Coumadin and was on digoxin prior to admission.  She did have some     significant bradycardia and pauses, prompting discontinuation of     her digoxin.  She will be discharged home on her usual dose of     Lopressor.  Her discharge heart rate is 60 beats per minute.  She     will need close followup of her INR to ensure that she gets into     the therapeutic range and stays there.  Pharmacy has been dosing     her Coumadin in house. 3. Dysphagia with history of esophageal stricture:  The patient did     not have any issues related to this during her hospital stay. 4. Acute-on-chronic  diastolic congestive heart failure:  The patient     was put on diuretic therapy and diuresed well with a reduction in     her pro BNP values. 5. History of CVA/stroke:  The patient is maintained on Coumadin and     aspirin.  Her risk factors appear to be under reasonable control at     this time, although better LDL control can be recommended. 6. Dyslipidemia:  The patient was maintained on her usual dose of     simvastatin.  Her lipids are as noted above and would recommend     follow up with her primary care physician to determine if better     lipid control is indicated. 7. Depression:  The patient is maintained on Wellbutrin at her home     dose.  DISPOSITION:  The patient is medically stable and has been cleared for discharge by cardiology.  DISCHARGE INSTRUCTIONS:  ACTIVITY:  No restriction, increase activity slowly.  DIET:  Low-sodium, heart-healthy.  FOLLOWUP:  With Dr. Myrtis Ser in 4 weeks.  Call for an appointment time.  Follow up with Dr. Drue Novel in 1 week for a recheck of your blood work.  Call for an appointment time.  Time spent coordinating care for discharge and discharge instructions including face-to-face time equals 35 minutes.  CONDITION ON DISCHARGE:  Improved.     Hillery Aldo, M.D.     CR/MEDQ  D:  11/30/2010  T:  11/30/2010  Job:  [id number]  cc:   Willow Ora, MD [phone] [place], [place] [address]  Luis Abed, MD, [facility] [address]  [address] [place] [address]  Leslye Peer, MD [address] [place], [place] [address]  Electronically Signed by Hillery Aldo M.D. on 12/03/2010 03:53:10 PM

## 2010-12-05 ENCOUNTER — Ambulatory Visit (INDEPENDENT_AMBULATORY_CARE_PROVIDER_SITE_OTHER): Payer: Medicare Other | Admitting: *Deleted

## 2010-12-05 DIAGNOSIS — Z7901 Long term (current) use of anticoagulants: Secondary | ICD-10-CM

## 2010-12-05 DIAGNOSIS — I4891 Unspecified atrial fibrillation: Secondary | ICD-10-CM

## 2010-12-05 DIAGNOSIS — I10 Essential (primary) hypertension: Secondary | ICD-10-CM

## 2010-12-05 LAB — BASIC METABOLIC PANEL
BUN: 15 mg/dL (ref 6–23)
GFR: 62.02 mL/min (ref >60.00)
Potassium: 4.5 mEq/L (ref 3.5–5.1)
Sodium: 143 mEq/L (ref 135–145)

## 2010-12-05 LAB — POCT INR: INR: 2

## 2010-12-06 NOTE — Patient Instructions (Addendum)
Pt advised no change recheck 4 weeks. Also needs hosp f/u. --------------------------------------- Agree Willow Ora

## 2010-12-08 ENCOUNTER — Telehealth: Payer: Self-pay | Admitting: Internal Medicine

## 2010-12-08 ENCOUNTER — Telehealth: Payer: Self-pay | Admitting: *Deleted

## 2010-12-08 NOTE — Telephone Encounter (Signed)
Message left for patient to return my call.  

## 2010-12-08 NOTE — Telephone Encounter (Signed)
Pt is aware.  

## 2010-12-08 NOTE — Telephone Encounter (Signed)
See previous phone note.  

## 2010-12-08 NOTE — Telephone Encounter (Signed)
Message copied by Leanne Lovely on Thu Dec 08, 2010  3:23 PM ------      Message from: Virginia Price      Created: Thu Dec 08, 2010  7:53 AM       BMP okay, she needs a hospital followup. Please be sure she has one within few days

## 2010-12-12 ENCOUNTER — Ambulatory Visit (INDEPENDENT_AMBULATORY_CARE_PROVIDER_SITE_OTHER): Payer: Medicare Other | Admitting: Internal Medicine

## 2010-12-12 ENCOUNTER — Encounter: Payer: Self-pay | Admitting: Internal Medicine

## 2010-12-12 VITALS — BP 126/84 | HR 90 | Wt 157.2 lb

## 2010-12-12 DIAGNOSIS — I10 Essential (primary) hypertension: Secondary | ICD-10-CM

## 2010-12-12 DIAGNOSIS — I509 Heart failure, unspecified: Secondary | ICD-10-CM

## 2010-12-12 DIAGNOSIS — I4891 Unspecified atrial fibrillation: Secondary | ICD-10-CM

## 2010-12-12 DIAGNOSIS — I5032 Chronic diastolic (congestive) heart failure: Secondary | ICD-10-CM | POA: Insufficient documentation

## 2010-12-12 MED ORDER — ETHACRYNIC ACID 25 MG PO TABS: 50.0000 mg | ORAL_TABLET | Freq: Every day | ORAL | Status: DC

## 2010-12-12 NOTE — Assessment & Plan Note (Addendum)
Recently admitted with CHF, digoxin  was discontinued d/t bradycardia, she was started on Lasix. She tolerates it well, however she has a history of allergies to sulfa drugs (rash). Weight is about the same as prior to the last admission---> 157 pounds. Subjectively, she still has shortness of breath and objectively she has some edema. Plan: Needs more  diuresis, history of sulfa allergies, recommend to change from laxis to edecrin 25 mg 2 tabs a day Reassess in 2 weeks Chart is reviewed, to my knowledge she has not try ARBs, if she's not improving consider start losartan Also will arrange for a   cardiology visit if she is not improving on return to the office

## 2010-12-12 NOTE — Assessment & Plan Note (Signed)
Well-controlled, no change 

## 2010-12-12 NOTE — Assessment & Plan Note (Signed)
Digoxin  recently discontinued in the hospital due to bradycardia. Pulse today stable, ~90 No change. Consider  reintroduce digoxin if she become tachycardic

## 2010-12-12 NOTE — Progress Notes (Signed)
  Subjective:    Patient ID: Virginia Price, female    DOB: [birth year], 77 y.o.   MRN: [id number]  HPI Recently admitted to the hospital, she saw cardiology, chart reviewed: Mild CHF, respond well to IV Lasix, ST segment changes in 2 leads of questionable significance Chronic atrial fibrillation, noted to have some bradycardia and pauses, digoxin was discontinued Non-cardiac chronic chest pain She was eventually discharged home 11-30-10 Relevant x-rays and labs: Initial chest x-ray showing a questionable of mild interstitial edema Last BMP on July 9 was essentially normal Initial digoxin level was 0.6, low TSH normal Total cholesterol 190, LDL 104 Initial BNP was 1124, repeated BNP 1051, then 510  Past Medical History  Diagnosis Date  . Atrial fibrillation     Coumadin therapy  . Chest pain     Catheterization normal 2004, /  nuclear 2006 no ischemia  . Hyperlipidemia   . Hypertension   . Stroke   . Allergic rhinitis   . GERD (gastroesophageal reflux disease)   . Diverticular disease   . Colon polyp   . Abnormal chest CT     "innumerable small nodules  . Abnormal head CT 06/16/2010    atroph y  . Cataract     bilateral  . Drug therapy     Coumadin... atrial fibrillation  . Malignant melanoma   . Memory loss   . Restless leg syndrome   . CVA (cerebral infarction)     October, 2003.Marland Kitchen left middle cerebral artery territory   Past Surgical History  Procedure Date  . Cholecystectomy 1991  . Radical hysterectomy 1978  . Cataract extraction     bilateral  . Appendectomy   . Mohs surgery     03/2005   History   Social History  . Marital Status: Widowed    Spouse Name: N/A    Number of Children: 3  . Years of Education: N/A   Occupational History  .     Social History Main Topics  . Smoking status: Never Smoker   . Smokeless tobacco: Not on file  . Alcohol Use: No  . Drug Use:   . Sexually Active:    Other Topics Concern  . Not on file   Social History  Narrative   Lives by herself, 1 daughter in [place], 1 son in [place], 1 daughter in [place]-- still drives     Review of Systems Feeling about the same, still has occasional chest pain on and off, no better or worse. Still having occasional swelling in the feet, left worse than right. Shortness of breath at baseline, interestingly she complains of wheezing when she lays down. Digoxin was discontinued, good compliance with Lasix, reports that  "made her go to the bathroom a lot"    Objective:   Physical Exam  Constitutional: She is oriented to person, place, and time. She appears well-developed and well-nourished. No distress.  HENT:  Head: Normocephalic and atraumatic.  Neck: No JVD present.  Cardiovascular:       Normal S1-S2, no murmurs  Pulmonary/Chest: Effort normal and breath sounds normal. No respiratory distress. She has no wheezes. She has no rales.  Musculoskeletal:       Pitting edema at the dorsum of the feet, left worse than right. Good pedal pulses bilaterally  Neurological: She is alert and oriented to person, place, and time.          Assessment & Plan:

## 2010-12-18 NOTE — Consult Note (Signed)
[name], [name]                ACCOUNT NO.:  [id number]  MEDICAL RECORD NO.:  [id number]  LOCATION:  1416                         FACILITY:  [facility]  PHYSICIAN:  Jesse Sans. Nickalos Petersen, MD, FACCDATE OF BIRTH:  04-13-1926  DATE OF CONSULTATION: DATE OF DISCHARGE:                                CONSULTATION   I was asked by the triad hospitalist service, specifically Dr. Suanne Marker to evaluate [name] with chest discomfort and atrial fibrillation.  HISTORY OF PRESENT ILLNESS:  Ms. Virginia Price is a delightful 77 year old white female, long-time patient Dr. Jerral Bonito in [facility], who presented with a several month history of aching in her chest.  It is an area about the size of a tennis ball above her left breast.  It does not radiate.  It is not associated with activity exclusively.  It can last for hours at a time.  There is no associated nausea, vomiting or diaphoresis.  She does get mildly short of breath when it gets severe.  She also has a history of gastroesophageal reflux and esophageal stricture.  She has a lot of difficulty swallowing and certainly cannot eat certain foods like rice.  She does not eat much meat.  It sticks in the midsternal area.  She has chronic atrial fibrillation and is on anticoagulation.  Her INR was slightly subtherapeutic when she was admitted.  A 2-D echocardiogram on June 08, 2010, showed EF of 60% with normal diastolic parameters, mild to moderate mitral regurgitation, mildly dilated left atrium, moderately dilated right atrium and mild elevation of right-sided pressures.  She has been treated with rate control with digoxin and anticoagulation. She has done remarkably well.  There is no history of coronary artery disease.  There was a question of remote infarct in the past, however, she had a normal catheterization per Dr. Myrtis Ser note in 2004.  She had a negative Cardiolite for ischemia in 2006.  She also has a history of CVA with a left  temporal stroke in the past. This has not been a recurrent event.  PAST MEDICAL HISTORY:  In addition to the above, she has multiple drug allergies.  History of depression, history of UTIs, allergic rhinitis.  ALLERGIES:  She is allergic to CIPRO, PENICILLIN, and SULFA.  MEDICATIONS:  Her current meds on admission included, per medicine reconciliation: 1. Coumadin 2 mg daily. 2. Nitroglycerin p.r.n. 3. Fluticasone nasal spray 50 mcg as needed. 4. Wellbutrin 100 mg p.o. daily. 5. Simvastatin 40 mg a day. 6. Multivitamin over-the-counter. 7. Meclizine 12.5 mg twice daily. 8. Lopressor 50 mg half tablet twice a day. 9. Digoxin 0.125 mg a day. 10.Calcium carbonate vitamin D over-the-counter. 11.Aspirin 81 mg a day.  SOCIAL HISTORY:  She lives in a [place], [place].  She denies any tobacco, alcohol or drug use.  She lives alone.  FAMILY HISTORY:  Remarkable for coronary artery disease.  REVIEW OF SYSTEMS:  Negative other than HPI.  She denies orthopnea, PND or edema.  She has had some shortness of breath as noted above.  PHYSICAL EXAMINATION:  GENERAL:  A very pleasant elderly lady, in no acute distress. SKIN:  Warm and dry.  She is  alert and oriented x3.  She is very appropriate.  Affect is normal. VITAL SIGNS:  Blood pressure is 130/64, pulse is 76 and in atrial fib, temperature is 97.8, respirations 18, sats 94% on room air. HEENT:  Unremarkable except for false teeth. NECK:  Supple.  Carotid upstroke without bruits.  No thyromegaly.  No tracheal deviation.  No masses. LUNGS:  Clear to auscultation and percussion. HEART:  Reveals a regular rate and rhythm.  Soft systolic murmur.  No gallop. ABDOMEN:  Soft, good bowel sounds.  No tenderness. EXTREMITIES:  No cyanosis, clubbing, edema.  Pulses were present but reduced.  Skin is unremarkable.  LABORATORY DATA:  Laboratory data is significant for negative cardiac markers.  EKG shows new changes in I and aVL since  June 15, 2010. Her BNP was elevated on admission at 1124, now down to 1051 today.  INR was slightly subtherapeutic on admission as mentioned above.  CBC showed hemoglobin 11.8, white count 5.6.  Dig level 0.6.  TSH was normal.  Chest x-ray shows some mild interstitial changes.  She has COPD, borderline cardiac size.  ASSESSMENT: 1. Noncardiac or coronary chest pain.  This is no suggestion of any     pericarditis as well.  I suspect this is musculoskeletal since it     is well defined and does not radiate, nor is it related to     exertion.  She does have some dysphasia and has a history of     esophageal stricture which could be contributing to some of duress     in her chest. 2. Mild congestive heart failure with normal left ventricular systolic     function, hence diastolic heart failure responding to IV Lasix. 3. Chronic atrial fibrillation 4. Anticoagulation 5. Subtherapeutic. 6. History of stroke. 7. History of a normal cath in the past as well as a negative stress     nuclear study in 2006.  She does have some new ST-segment changes in I and aVL of questionable significance.  I would just note them for completeness.  RECOMMENDATIONS:  No change in current medical therapy as an outpatient. She will need Lasix probably 20 mg a day at discharge.  Salt restriction, a 2-g sodium diet.  Reassurance.  I have reassured her today about her heart.  Her INR needs to be 2 to 3 with her atrial fib.  Thank for the consultation.  If there are further questions, please call us.     Welby Montminy C. Daleen Squibb, MD, [facility]     TCW/MEDQ  D:  11/29/2010  T:  11/29/2010  Job:  [id number]  Electronically Signed by Valera Castle MD [facility] on 12/18/2010 04:30:56 PM

## 2010-12-23 ENCOUNTER — Encounter: Payer: Self-pay | Admitting: Internal Medicine

## 2010-12-23 ENCOUNTER — Ambulatory Visit (INDEPENDENT_AMBULATORY_CARE_PROVIDER_SITE_OTHER): Payer: Medicare Other | Admitting: Internal Medicine

## 2010-12-23 VITALS — BP 130/88 | HR 103 | Wt 157.6 lb

## 2010-12-23 DIAGNOSIS — I4891 Unspecified atrial fibrillation: Secondary | ICD-10-CM

## 2010-12-23 DIAGNOSIS — I509 Heart failure, unspecified: Secondary | ICD-10-CM

## 2010-12-23 MED ORDER — ETHACRYNIC ACID 25 MG PO TABS: 50.0000 mg | ORAL_TABLET | Freq: Every day | ORAL | Status: DC

## 2010-12-23 NOTE — Progress Notes (Signed)
  Subjective:    Patient ID: KERSTIE AGENT, female    DOB: 05/01/1926, 77 y.o.   MRN: [id number]  HPI Followup from the last office visit, doing well.  Past Medical History  Diagnosis Date  . Atrial fibrillation     Coumadin therapy  . Chest pain     Catheterization normal 2004, /  nuclear 2006 no ischemia  . Hyperlipidemia   . Hypertension   . Stroke   . Allergic rhinitis   . GERD (gastroesophageal reflux disease)   . Diverticular disease   . Colon polyp   . Abnormal chest CT     "innumerable small nodules  . Abnormal head CT 06/16/2010    atroph y  . Cataract     bilateral  . Drug therapy     Coumadin... atrial fibrillation  . Malignant melanoma   . Memory loss   . Restless leg syndrome   . CVA (cerebral infarction)     October, 2003.Marland Kitchen left middle cerebral artery territory   Past Surgical History  Procedure Date  . Cholecystectomy 1991  . Radical hysterectomy 1978  . Cataract extraction     bilateral  . Appendectomy   . Mohs surgery     03/2005      Review of Systems Shortness of breath has improved She weight herself daily and her weight is stable Due to confusion in the pharmacy, she did not get edecrine, she actually skipped Lasix for several days while out of town and restarted it yesterday. Denies any allergy sx while on Lasix but does have a h/o rash w/ sulfa Edema has been essentially gone until today when she noticed a little bit around the ankles.    Objective:   Physical Exam  Constitutional: She appears well-developed and well-nourished. No distress.  HENT:  Head: Normocephalic and atraumatic.  Cardiovascular:          Normal S1-S2, no murmurs    Pulmonary/Chest: Effort normal and breath sounds normal. No respiratory distress. She has no wheezes. She has no rales.  Musculoskeletal: She exhibits no edema.  Skin: She is not diaphoretic.          Assessment & Plan:   CHF (congestive heart failure) Did not start edecrin Doing well  despite not taking lasix consistently; I'm still concern about the h/o rash w/  Sulfa drugs and the use of lasix: rec to switch to edecrin Low salt diet F/u w/  Cards in few days as schedule

## 2010-12-23 NOTE — Assessment & Plan Note (Addendum)
Did not start edecrin, see history of present illness Doing well despite not taking lasix consistently; I'm still concern about the h/o rash w/  Sulfa drugs and the use of lasix: rec to switch to edecrin Low salt diet F/u w/  Cards in few days as schedule (a few days): Keep an eye on her weight, see  instructions

## 2010-12-23 NOTE — Assessment & Plan Note (Signed)
See previous visit, and digoxin was discontinued in the hospital due to bradycardia and pauses. Heart rate when she came to the office was 103, I rechecked it and it was 80. I asked patient to check her pulse daily and call us if  is going higher than 80. We may need to restart digoxin or increase metoprolol.

## 2010-12-23 NOTE — Patient Instructions (Addendum)
Switch to edecrin Came back for a coumadin check around 01-05-2011 Weight yourself daily, call if wt goes up > than 2 pounds

## 2010-12-28 ENCOUNTER — Encounter: Payer: Self-pay | Admitting: Cardiology

## 2011-01-04 ENCOUNTER — Other Ambulatory Visit: Payer: Self-pay | Admitting: Internal Medicine

## 2011-01-05 ENCOUNTER — Encounter: Payer: Self-pay | Admitting: Cardiology

## 2011-01-05 DIAGNOSIS — I482 Chronic atrial fibrillation, unspecified: Secondary | ICD-10-CM | POA: Insufficient documentation

## 2011-01-05 DIAGNOSIS — I639 Cerebral infarction, unspecified: Secondary | ICD-10-CM | POA: Insufficient documentation

## 2011-01-06 ENCOUNTER — Ambulatory Visit (INDEPENDENT_AMBULATORY_CARE_PROVIDER_SITE_OTHER): Payer: Medicare Other | Admitting: Cardiology

## 2011-01-06 ENCOUNTER — Encounter: Payer: Self-pay | Admitting: Cardiology

## 2011-01-06 ENCOUNTER — Ambulatory Visit (INDEPENDENT_AMBULATORY_CARE_PROVIDER_SITE_OTHER): Payer: Medicare Other | Admitting: *Deleted

## 2011-01-06 VITALS — BP 106/68 | HR 85 | Ht 67.0 in | Wt 156.0 lb

## 2011-01-06 DIAGNOSIS — Z5189 Encounter for other specified aftercare: Secondary | ICD-10-CM

## 2011-01-06 DIAGNOSIS — R079 Chest pain, unspecified: Secondary | ICD-10-CM

## 2011-01-06 DIAGNOSIS — Z7901 Long term (current) use of anticoagulants: Secondary | ICD-10-CM

## 2011-01-06 DIAGNOSIS — Z79899 Other long term (current) drug therapy: Secondary | ICD-10-CM

## 2011-01-06 DIAGNOSIS — I5032 Chronic diastolic (congestive) heart failure: Secondary | ICD-10-CM

## 2011-01-06 DIAGNOSIS — I4891 Unspecified atrial fibrillation: Secondary | ICD-10-CM

## 2011-01-06 DIAGNOSIS — I340 Nonrheumatic mitral (valve) insufficiency: Secondary | ICD-10-CM

## 2011-01-06 DIAGNOSIS — I509 Heart failure, unspecified: Secondary | ICD-10-CM

## 2011-01-06 DIAGNOSIS — I059 Rheumatic mitral valve disease, unspecified: Secondary | ICD-10-CM

## 2011-01-06 LAB — POCT INR: INR: 1.7

## 2011-01-06 NOTE — Assessment & Plan Note (Signed)
She does have mitral regurgitation is difficult to hear on physical exam.  No further workup is needed.

## 2011-01-06 NOTE — Progress Notes (Signed)
HPI The patient is seen today for cardiology followup.  She is also seen to followup recent hospitalization.  As part of the evaluation today I have reviewed the hospital consult in the discharge summary.  Also reviewed an echo that was done in January, 2012.  She was recently hospitalized in July, 2012.  At that time she had some noncardiac chest pain.  She did have some mild CHF with volume overload.  She had chronic atrial fibrillation.  Her meds were adjusted and she was instructed in the approach to home therapy for her volume overload.  She done very well.  Her volume status has remained stable.  She has not had any recurrent chest pain. Allergies  Allergen Reactions  . Hydrocodone-Acetaminophen     REACTION: nausea  . Metronidazole   . Penicillins   . Prednisone     REACTION: ? reaction  . Sulfamethoxazole W/Trimethoprim   . Sulfonamide Derivatives   . Tramadol Hcl     Current Outpatient Prescriptions  Medication Sig Dispense Refill  . aspirin 81 MG tablet Take 81 mg by mouth daily.        Marland Kitchen buPROPion (WELLBUTRIN) 100 MG tablet 1 tab daily and half tab on Tuesday and friday      . calcium-vitamin D (OSCAL) 250-125 MG-UNIT per tablet Take 1 tablet by mouth daily.        Marland Kitchen ethacrynic acid ([name]) 25 MG tablet Take 2 tablets (50 mg total) by mouth daily.  60 tablet  6  . loratadine (CLARITIN) 10 MG tablet Take 10 mg by mouth daily.        . meclizine (ANTIVERT) 12.5 MG tablet Take 12.5 mg by mouth 2 (two) times daily.        . metoprolol (LOPRESSOR) 50 MG tablet TAKE 1/2 TABLET BY MOUTH TWICE A DAY  30 tablet  2  . multivitamin (THERAGRAN) per tablet Take 1 tablet by mouth daily.        . nitroGLYCERIN (NITROLINGUAL) 0.4 MG/SPRAY spray Place 1 spray under the tongue every 5 (five) minutes as needed.        . simvastatin (ZOCOR) 40 MG tablet Take 1 tablet (40 mg total) by mouth at bedtime.  30 tablet  0  . warfarin (COUMADIN) 2 MG tablet Take 1 tablet (2 mg total) by mouth as  directed.  30 tablet  1    History   Social History  . Marital Status: Widowed    Spouse Name: N/A    Number of Children: 3  . Years of Education: N/A   Occupational History  .     Social History Main Topics  . Smoking status: Never Smoker   . Smokeless tobacco: Not on file  . Alcohol Use: No  . Drug Use:   . Sexually Active:    Other Topics Concern  . Not on file   Social History Narrative   Lives by herself, 1 daughter in [place], 1 son in [place], 1 daughter in [place]-- still drives     Family History  Problem Relation Age of Onset  . Heart disease Mother   . Colon polyps Mother   . Heart disease Father     Past Medical History  Diagnosis Date  . Atrial fibrillation     Coumadin therapy,  Holter monitor, March, 2012, atrial fib heart rate ranges, no more bradycardia or tachycardia while on digoxin  . Chest pain     Catheterization normal 2004, /  nuclear 2006 no  ischemia  . Hyperlipidemia   . Hypertension   . Stroke   . Allergic rhinitis   . GERD (gastroesophageal reflux disease)   . Diverticular disease   . Colon polyp   . Abnormal chest CT     "innumerable small nodules  . Abnormal head CT 06/16/2010    atroph y  . Cataract     bilateral  . Drug therapy     Coumadin... atrial fibrillation  . Malignant melanoma   . Memory loss   . Restless leg syndrome   . CVA (cerebral infarction)     October, 2003, left middle cerebral artery territory  . CHF (congestive heart failure)     Diastolic with fluid overload, normal systolic function,, July, 2012.  Atrial fibrillation  . Ejection fraction <     EF 55-60% echo, January, 2012  . Mitral regurgitation     Mild to moderate, echo, January, 2012    Past Surgical History  Procedure Date  . Cholecystectomy 1991  . Radical hysterectomy 1978  . Cataract extraction     bilateral  . Appendectomy   . Mohs surgery     03/2005    ROS  Patient denies fever, chills, headache, sweats, rash, change in  vision, change in hearing, chest pain, cough, nausea vomiting, urinary symptoms.  All other systems are reviewed and are negative.  PHYSICAL EXAM Patient is oriented to person time and place.  Affect is normal.  She is here with her daughter.  Head is atraumatic.  There is no jugular venous distention.  Lungs are clear.  Respiratory effort is nonlabored.  Cardiac exam reveals S1 and S2.  The rhythm is irregularly irregular.  There no significant murmurs.  The abdomen is soft.  No peripheral edema.  No musculoskeletal deformities.  There no skin rashes. Filed Vitals:   01/06/11 1137  BP: 106/68  Pulse: 85  Height: 5\' 7"  (1.702 m)  Weight: 156 lb (70.761 kg)    EKG Is done today and reviewed by me.  There is atrial fibrillation with controlled rate.  There is no significant change from the past.  ASSESSMENT & PLAN

## 2011-01-06 NOTE — Assessment & Plan Note (Signed)
Her atrial fib is under good control.  No change in therapy.

## 2011-01-06 NOTE — Patient Instructions (Signed)
Your physician recommends that you schedule a follow-up appointment in: 6 months with Dr. Myrtis Ser. The office will mail you a reminder letter 2 months prior appointment date. Your physician recommends that you continue on your current medications as directed. Please refer to the Current Medication list given to you today.

## 2011-01-06 NOTE — Assessment & Plan Note (Signed)
The patient has had no recurring chest pain.  No further workup is needed.

## 2011-01-06 NOTE — Patient Instructions (Addendum)
Pt advised will call w/ instructions ---------------------- Increase Coumadin2 mg tablet:  one tablet daily, 1.5 tablets Monday Wednesday and Friday. (17 mg weekly) INR in 2 weeks [facility] left detailed msg on pt machine w/ instructions informed will check on Monday to see if she got instructions.  Chemira Janeann Merl w/ pt says she had received instructions on pt/inr Doristine Devoid

## 2011-01-06 NOTE — Assessment & Plan Note (Signed)
She had fluid overload with a recent admission.  She is diuresed nicely.  She was on Lasix and tolerated.  Because of a sulfa allergy her meds has been changed to ethacrynic acid.  She is quite stable with this.  Going forward I suspect that she can safely receive Lasix if needed.

## 2011-01-06 NOTE — Assessment & Plan Note (Signed)
Patient is on Coumadin for atrial fib and is being monitored.

## 2011-01-10 ENCOUNTER — Ambulatory Visit: Payer: Medicare Other

## 2011-01-23 ENCOUNTER — Other Ambulatory Visit: Payer: Self-pay | Admitting: Internal Medicine

## 2011-01-23 MED ORDER — DIGOXIN 125 MCG PO TABS: 125.0000 ug | ORAL_TABLET | Freq: Every day | ORAL | Status: DC

## 2011-01-23 NOTE — Telephone Encounter (Signed)
Done

## 2011-01-25 ENCOUNTER — Other Ambulatory Visit: Payer: Self-pay | Admitting: Internal Medicine

## 2011-01-26 ENCOUNTER — Ambulatory Visit (INDEPENDENT_AMBULATORY_CARE_PROVIDER_SITE_OTHER): Payer: Medicare Other | Admitting: *Deleted

## 2011-01-26 ENCOUNTER — Ambulatory Visit: Payer: Medicare Other

## 2011-01-26 DIAGNOSIS — Z7901 Long term (current) use of anticoagulants: Secondary | ICD-10-CM

## 2011-01-26 DIAGNOSIS — I4891 Unspecified atrial fibrillation: Secondary | ICD-10-CM

## 2011-01-26 MED ORDER — WARFARIN SODIUM 2 MG PO TABS: 2.0000 mg | ORAL_TABLET | ORAL | Status: DC

## 2011-01-26 NOTE — Patient Instructions (Addendum)
With coumadin 14 mg a week she was seen in the lower side, we increased to 17 mg a week and now inr is 4.5 Plan: Hold for 2 days New dose : Coumadin 2 mg one tablet daily except Tuesdays and Fridays take 1.5 tablet (16mg  a week) Next coumadin 2 weeks [facility]   Pt aware and given written instructions. Virginia Price

## 2011-01-27 ENCOUNTER — Ambulatory Visit: Payer: Medicare Other

## 2011-02-06 ENCOUNTER — Other Ambulatory Visit: Payer: Self-pay | Admitting: Internal Medicine

## 2011-02-07 NOTE — Telephone Encounter (Signed)
Rx Done . 

## 2011-02-10 ENCOUNTER — Ambulatory Visit (INDEPENDENT_AMBULATORY_CARE_PROVIDER_SITE_OTHER): Payer: Medicare Other | Admitting: Internal Medicine

## 2011-02-10 ENCOUNTER — Encounter: Payer: Self-pay | Admitting: Internal Medicine

## 2011-02-10 DIAGNOSIS — Z7901 Long term (current) use of anticoagulants: Secondary | ICD-10-CM

## 2011-02-10 DIAGNOSIS — I4891 Unspecified atrial fibrillation: Secondary | ICD-10-CM

## 2011-02-10 DIAGNOSIS — I509 Heart failure, unspecified: Secondary | ICD-10-CM

## 2011-02-10 DIAGNOSIS — I10 Essential (primary) hypertension: Secondary | ICD-10-CM

## 2011-02-10 DIAGNOSIS — J984 Other disorders of lung: Secondary | ICD-10-CM

## 2011-02-10 LAB — POCT INR: INR: 2.8

## 2011-02-10 NOTE — Assessment & Plan Note (Signed)
Stable, not on digoxin

## 2011-02-10 NOTE — Progress Notes (Signed)
  Subjective:    Patient ID: Virginia Price, female    DOB: 03/17/1926, 77 y.o.   MRN: [id number]  HPI Here with her daughter, doing about the same, no new developments.  Past Medical History  Diagnosis Date  . Atrial fibrillation     Coumadin therapy,  Holter monitor, March, 2012, atrial fib heart rate ranges, no more bradycardia or tachycardia while on digoxin  . Chest pain     Catheterization normal 2004, /  nuclear 2006 no ischemia  . Hyperlipidemia   . Hypertension   . Stroke   . Allergic rhinitis   . GERD (gastroesophageal reflux disease)   . Diverticular disease   . Colon polyp   . Abnormal chest CT     "innumerable small nodules  . Abnormal head CT 06/16/2010    atroph y  . Cataract     bilateral  . Drug therapy     Coumadin... atrial fibrillation  . Malignant melanoma   . Memory loss   . Restless leg syndrome   . CVA (cerebral infarction)     October, 2003, left middle cerebral artery territory  . CHF (congestive heart failure)     Diastolic with fluid overload, normal systolic function,, July, 2012.  Atrial fibrillation  . Ejection fraction <     EF 55-60% echo, January, 2012  . Mitral regurgitation     Mild to moderate, echo, January, 2012   Past Surgical History  Procedure Date  . Cholecystectomy 1991  . Radical hysterectomy 1978  . Cataract extraction     bilateral  . Appendectomy   . Mohs surgery     03/2005     Review of Systems Good medication compliance, she is not on digoxin, she takes [name] one in the morning and one in the afternoon. Continue with occasional feet swelling. Continue with chest congestion at night, states she uses at least 2 pillows when goes to sleep and sometimes sleeps in the recliner.  Describes some symptoms consistent with paroxysmal nocturnal dyspnea but they are atypical (wakes up at night slightly short of breath but symptoms last a few seconds) . Denies palpitations    Objective:   Physical Exam  Constitutional:  She appears well-developed.  HENT:  Head: Normocephalic and atraumatic.  Neck: No JVD present.  Cardiovascular:       Actually, seems regular today.  Pulmonary/Chest: Effort normal and breath sounds normal. No respiratory distress. She has no wheezes. She has no rales.  Musculoskeletal:       Trace edema at the dorsum of her feet          Assessment & Plan:

## 2011-02-10 NOTE — Patient Instructions (Addendum)
Coumadin 2 mg one tablet daily except Tuesdays and Fridays take 1.5 tablet (16mg  a week)  Next coumadin: 3 weeks Increase edecrin, watch for low BPs, see if that helps with the nocturnal symptoms

## 2011-02-10 NOTE — Assessment & Plan Note (Signed)
Well controlled 

## 2011-02-10 NOTE — Assessment & Plan Note (Addendum)
Cont w/ subjective sx of CHF, no vol overload today Increase edecrin from 2 am to 2 in AM and 1 in the afternoon. Advised to be careful with her BP, will let me know if BP is less than 105/60. Also advised patient to call me even if this change in medication  has no impact on her nocturnal symptoms

## 2011-02-15 ENCOUNTER — Emergency Department (HOSPITAL_COMMUNITY)
Admission: EM | Admit: 2011-02-15 | Discharge: 2011-02-15 | Disposition: A | Discharge status: Home / Self Care | Payer: Medicare Other | Attending: Emergency Medicine | Admitting: Emergency Medicine

## 2011-02-15 ENCOUNTER — Telehealth: Payer: Self-pay | Admitting: *Deleted

## 2011-02-15 ENCOUNTER — Emergency Department (HOSPITAL_COMMUNITY): Payer: Medicare Other

## 2011-02-15 DIAGNOSIS — Z7901 Long term (current) use of anticoagulants: Secondary | ICD-10-CM | POA: Insufficient documentation

## 2011-02-15 DIAGNOSIS — I509 Heart failure, unspecified: Secondary | ICD-10-CM | POA: Insufficient documentation

## 2011-02-15 DIAGNOSIS — R0602 Shortness of breath: Secondary | ICD-10-CM | POA: Insufficient documentation

## 2011-02-15 DIAGNOSIS — R0989 Other specified symptoms and signs involving the circulatory and respiratory systems: Secondary | ICD-10-CM | POA: Insufficient documentation

## 2011-02-15 DIAGNOSIS — I10 Essential (primary) hypertension: Secondary | ICD-10-CM | POA: Insufficient documentation

## 2011-02-15 DIAGNOSIS — R350 Frequency of micturition: Secondary | ICD-10-CM | POA: Insufficient documentation

## 2011-02-15 DIAGNOSIS — J438 Other emphysema: Secondary | ICD-10-CM | POA: Insufficient documentation

## 2011-02-15 DIAGNOSIS — R0609 Other forms of dyspnea: Secondary | ICD-10-CM | POA: Insufficient documentation

## 2011-02-15 DIAGNOSIS — I2510 Atherosclerotic heart disease of native coronary artery without angina pectoris: Secondary | ICD-10-CM | POA: Insufficient documentation

## 2011-02-15 LAB — CBC
Hemoglobin: 11.9 g/dL — ABNORMAL LOW (ref 12.0–15.0)
Platelets: 233 10*3/uL (ref 150–400)
RBC: 4.06 MIL/uL (ref 3.87–5.11)
WBC: 5.5 10*3/uL (ref 4.0–10.5)

## 2011-02-15 LAB — COMPREHENSIVE METABOLIC PANEL
ALT: 19 U/L (ref 0–35)
AST: 22 U/L (ref 0–37)
Calcium: 9.7 mg/dL (ref 8.4–10.5)
Creatinine, Ser: 0.77 mg/dL (ref 0.50–1.10)
GFR calc Af Amer: >60 mL/min (ref >60)
GFR calc non Af Amer: >60 mL/min (ref >60)
Sodium: 140 mEq/L (ref 135–145)
Total Protein: 7.1 g/dL (ref 6.0–8.3)

## 2011-02-15 LAB — URINALYSIS, ROUTINE W REFLEX MICROSCOPIC
Bilirubin Urine: NEGATIVE
Nitrite: NEGATIVE
Specific Gravity, Urine: 1.011 (ref 1.005–1.030)
Urobilinogen, UA: 0.2 mg/dL (ref 0.0–1.0)
pH: 6 (ref 5.0–8.0)

## 2011-02-15 LAB — DIFFERENTIAL
Basophils Absolute: 0 10*3/uL (ref 0.0–0.1)
Basophils Relative: 1 % (ref 0–1)
Eosinophils Absolute: 0.1 10*3/uL (ref 0.0–0.7)
Neutro Abs: 3.5 10*3/uL (ref 1.7–7.7)
Neutrophils Relative %: 63 % (ref 43–77)

## 2011-02-15 LAB — PRO B NATRIURETIC PEPTIDE: Pro B Natriuretic peptide (BNP): 876.5 pg/mL — ABNORMAL HIGH (ref 0–450)

## 2011-02-15 LAB — URINE MICROSCOPIC-ADD ON

## 2011-02-15 LAB — POCT I-STAT TROPONIN I: Troponin i, poc: 0 ng/mL (ref 0.00–0.08)

## 2011-02-15 LAB — PROTIME-INR
INR: 2.42 — ABNORMAL HIGH (ref 0.00–1.49)
Prothrombin Time: 26.7 seconds — ABNORMAL HIGH (ref 11.6–15.2)

## 2011-02-15 NOTE — Telephone Encounter (Signed)
Received call from daughter mom been having some SOB, Can hardly talk, and wheezing really bad. Advise daughter of pt symptoms she need to go to ER since md didn't have any availability. Daughter agreed and states will take her to ER....02/15/11@1 :24pm/LMB

## 2011-02-16 NOTE — Telephone Encounter (Signed)
agree

## 2011-02-17 LAB — URINE CULTURE: Colony Count: >=100000

## 2011-02-25 ENCOUNTER — Other Ambulatory Visit: Payer: Self-pay | Admitting: Cardiology

## 2011-02-28 ENCOUNTER — Ambulatory Visit: Payer: Medicare Other

## 2011-02-28 ENCOUNTER — Ambulatory Visit (INDEPENDENT_AMBULATORY_CARE_PROVIDER_SITE_OTHER): Payer: Medicare Other | Admitting: Internal Medicine

## 2011-02-28 ENCOUNTER — Encounter: Payer: Self-pay | Admitting: Internal Medicine

## 2011-02-28 DIAGNOSIS — N390 Urinary tract infection, site not specified: Secondary | ICD-10-CM

## 2011-02-28 DIAGNOSIS — M549 Dorsalgia, unspecified: Secondary | ICD-10-CM

## 2011-02-28 DIAGNOSIS — I4891 Unspecified atrial fibrillation: Secondary | ICD-10-CM

## 2011-02-28 DIAGNOSIS — R8290 Unspecified abnormal findings in urine: Secondary | ICD-10-CM

## 2011-02-28 LAB — POCT URINALYSIS DIPSTICK
Nitrite, UA: NEGATIVE
Protein, UA: NEGATIVE
Urobilinogen, UA: 0.2
pH, UA: 5

## 2011-02-28 MED ORDER — NITROFURANTOIN MACROCRYSTAL 100 MG PO CAPS: 100.0000 mg | ORAL_CAPSULE | Freq: Two times a day (BID) | ORAL | Status: DC

## 2011-02-28 MED ORDER — PROMETHAZINE HCL 12.5 MG PO TABS: 12.5000 mg | ORAL_TABLET | Freq: Four times a day (QID) | ORAL | Status: AC | PRN

## 2011-02-28 NOTE — Patient Instructions (Addendum)
Lots of fluids If no better in few days let me know If fever , worsen symptoms---->please call us  Same coumadin

## 2011-02-28 NOTE — Progress Notes (Signed)
  Subjective:    Patient ID: Virginia Price, female    DOB: 27-Aug-1925, 77 y.o.   MRN: [id number]  HPI Here with her daughter, went to the ER at 02-15-11, she was having edema----> chart reviewed: BMP normal, CBC normal. In the process they did a urine culture and he came back positive for Escherichia coli. A few days later they called in ciprofloxacin which she has not taken because she is allergic to it. 5 days ago, she started to have nausea which is worse when she tries to eat, has vomited a few times, no hematemesis. She is here today because nausea.  Past Medical History  Diagnosis Date  . Atrial fibrillation     Coumadin therapy,  Holter monitor, March, 2012, atrial fib heart rate ranges, no more bradycardia or tachycardia while on digoxin  . Chest pain     Catheterization normal 2004, /  nuclear 2006 no ischemia  . Hyperlipidemia   . Hypertension   . Stroke   . Allergic rhinitis   . GERD (gastroesophageal reflux disease)   . Diverticular disease   . Colon polyp   . Abnormal chest CT     "innumerable small nodules  . Abnormal head CT 06/16/2010    atroph y  . Cataract     bilateral  . Drug therapy     Coumadin... atrial fibrillation  . Malignant melanoma   . Memory loss   . Restless leg syndrome   . CVA (cerebral infarction)     October, 2003, left middle cerebral artery territory  . CHF (congestive heart failure)     Diastolic with fluid overload, normal systolic function,, July, 2012.  Atrial fibrillation  . Ejection fraction <     EF 55-60% echo, January, 2012  . Mitral regurgitation     Mild to moderate, echo, January, 2012   Past Surgical History  Procedure Date  . Cholecystectomy 1991  . Radical hysterectomy 1978  . Cataract extraction     bilateral  . Appendectomy   . Mohs surgery     03/2005     Review of Systems Denies any fevers or chills She had diarrhea initially but now is better No dysuria or gross hematuria She has developed right lower  quadrant abdominal pain which is on and off for the last 5 days.     Objective:   Physical Exam  Constitutional: She is oriented to person, place, and time. She appears well-developed and well-nourished. No distress.  HENT:  Head: Normocephalic and atraumatic.  Abdominal:         Nondistended, soft, tender (see graphic) without mass or rebound. Good bowel sounds.   Neurological: She is alert and oriented to person, place, and time.  Skin: She is not diaphoretic.  Psychiatric: She has a normal mood and affect. Her behavior is normal. Judgment and thought content normal.          Assessment & Plan:  Today , I spent more than 25  min with the patient, >50% of the time counseling, and /or reviewing the chart and labs ordered by other providers

## 2011-02-28 NOTE — Assessment & Plan Note (Signed)
Presents today with nausea, lower abdominal pain in the setting of untreated UTI. Chart is reviewed, she has recurrent UTIs, at some point she was seen by urology and prescribed Macrobid chronically. She is not currently taking it. Therapy is complicated by her allergies to Cipro, penicillin, sulfa drugs, et Karie Soda. Will treat with Macrobid, urine culture show intermediate sensitivity thus the patient and her daughter know to call me if she is not responding. If that is the case we'll have to use a cephalosporin which according to the chart review she has taken before without problems. See instructions. Since she was here, we also checked and INR and is okay. Followup in one month

## 2011-03-02 LAB — CULTURE, URINE COMPREHENSIVE: Colony Count: NO GROWTH

## 2011-03-02 NOTE — Telephone Encounter (Signed)
Refilled antivert

## 2011-03-03 ENCOUNTER — Ambulatory Visit (INDEPENDENT_AMBULATORY_CARE_PROVIDER_SITE_OTHER): Payer: Medicare Other | Admitting: Internal Medicine

## 2011-03-03 ENCOUNTER — Encounter: Payer: Self-pay | Admitting: Internal Medicine

## 2011-03-03 DIAGNOSIS — Z136 Encounter for screening for cardiovascular disorders: Secondary | ICD-10-CM

## 2011-03-03 DIAGNOSIS — R627 Adult failure to thrive: Secondary | ICD-10-CM

## 2011-03-03 DIAGNOSIS — N390 Urinary tract infection, site not specified: Secondary | ICD-10-CM

## 2011-03-03 DIAGNOSIS — I509 Heart failure, unspecified: Secondary | ICD-10-CM

## 2011-03-03 DIAGNOSIS — R6251 Failure to thrive (child): Secondary | ICD-10-CM

## 2011-03-03 MED ORDER — ETHACRYNIC ACID 25 MG PO TABS: ORAL_TABLET | ORAL | Status: DC

## 2011-03-03 MED ORDER — CEFPROZIL 500 MG PO TABS: 500.0000 mg | ORAL_TABLET | Freq: Two times a day (BID) | ORAL | Status: DC

## 2011-03-03 NOTE — Assessment & Plan Note (Addendum)
She again presents with symptoms suggestive of CHF: wheezing when lying down. She ran out of edecrine a week ago, currently taking an unknown dose of Lasix On exam however there is no JVD, she has increased only 1 pound since the last office visit. Lungs are clear. EKG today at baseline, no acute changes Plan: go back to edecrine 2 in the morning one in the afternoon Observation

## 2011-03-03 NOTE — Assessment & Plan Note (Addendum)
The patient lives by herself, still driving. She is forgetting to take her medications despite her daughter's effort to help her. I had a private conversation with the daughter, she has also noted that Virginia Price is gradually deteriorating  (getting more difficult to take care of herself). I encouraged her to think about placement or permanent help for her.

## 2011-03-03 NOTE — Patient Instructions (Signed)
Take meds  according to the list below ER if  severe symptoms, fever, chills, severe chest pain or shortness

## 2011-03-03 NOTE — Progress Notes (Signed)
Subjective:    Patient ID: Virginia Price, female    DOB: [birth year], 77 y.o.   MRN: [id number]  HPI Last night, she developed wheezing when she laid down to sleep she had to sit up in order to breathe better . As far as abdominal pain, pain is still there, unchanged. She also complains of chest tightness, no actual pain, mostly when she lays down, no radiation to the shoulders, the tight feeling is in the upper chest close to the neck.  Past Medical History  Diagnosis Date  . Atrial fibrillation     Coumadin therapy,  Holter monitor, March, 2012, atrial fib heart rate ranges, no more bradycardia or tachycardia while on digoxin  . Chest pain     Catheterization normal 2004, /  nuclear 2006 no ischemia  . Hyperlipidemia   . Hypertension   . Stroke   . Allergic rhinitis   . GERD (gastroesophageal reflux disease)   . Diverticular disease   . Colon polyp   . Abnormal chest CT     "innumerable small nodules  . Abnormal head CT 06/16/2010    atroph y  . Cataract     bilateral  . Drug therapy     Coumadin... atrial fibrillation  . Malignant melanoma   . Memory loss   . Restless leg syndrome   . CVA (cerebral infarction)     October, 2003, left middle cerebral artery territory  . CHF (congestive heart failure)     Diastolic with fluid overload, normal systolic function,, July, 2012.  Atrial fibrillation  . Ejection fraction <     EF 55-60% echo, January, 2012  . Mitral regurgitation     Mild to moderate, echo, January, 2012   Past Surgical History  Procedure Date  . Cholecystectomy 1991  . Radical hysterectomy 1978  . Cataract extraction     bilateral  . Appendectomy   . Mohs surgery     03/2005   History   Social History  . Marital Status: Widowed    Spouse Name: N/A    Number of Children: 3  . Years of Education: N/A   Occupational History  .     Social History Main Topics  . Smoking status: Never Smoker   . Smokeless tobacco: Not on file  . Alcohol Use: No   . Drug Use:   . Sexually Active:    Other Topics Concern  . Not on file   Social History Narrative   Lives by herself, 1 daughter in [place], 1 son in [place], 1 daughter in [place]-- still drives       Review of Systems Denies fevers Has cough on and off, worse when she lays down? Occasional indigestion, unclear if she has heartburn when she lays down. Dyspnea on exertion seems at baseline. Nausea was  Improving  until this AM when she felt sick again. Diarrhea this AM, no blood  As far as her medication compliance, she ran out of [name] a week ago, she was supposed to take 2 in the morning and one in the afternoon but she was actually taking 1 tablet twice a day. In addition I learned today that she has been taking some Lasix all along.   Objective:   Physical Exam  Constitutional: She appears well-developed and well-nourished.  HENT:  Head: Normocephalic and atraumatic.  Neck: No JVD present.  Cardiovascular:       Irregular  Pulmonary/Chest: Effort normal and breath sounds normal. No respiratory distress. She  has no wheezes. She has no rales.       I asked the patient to lie down, several minutes later she was resting comfortable, no cough or wheezing noted.  Musculoskeletal: She exhibits no edema.  Neurological: She is alert.  Psychiatric: She has a normal mood and affect. Her behavior is normal.          Assessment & Plan:

## 2011-03-03 NOTE — Assessment & Plan Note (Signed)
The urine culture taken 02/28/2011 was negative however I decided to continue with antibiotics because she had a previous, untreated positive urine culture. Macrobid was only moderate sensitive. Today the pain is about the same, abdominal exam is unchanged. Plan: Change from Macrobid to cefzil

## 2011-03-11 NOTE — H&P (Signed)
Virginia Price, Virginia Price                ACCOUNT NO.:  [id number]  MEDICAL RECORD NO.:  [id number]  LOCATION:  1416                         FACILITY:  [facility]  PHYSICIAN:  Lonia Blood, M.D.      DATE OF BIRTH:  February 02, 1926  DATE OF ADMISSION:  11/28/2010 DATE OF DISCHARGE:                             HISTORY & PHYSICAL   PRIMARY CARE PHYSICIAN:  Willow Ora, MD  PRESENTING COMPLAINT:  Chest pain and shortness of breath.  HISTORY OF PRESENT ILLNESS:  The patient is an 77 year old female with history of CVA and coronary artery disease remotely who has normal EF as of January of this year.  The patient woke up with shortness of breath, chest tightness as well as leg swelling.  She went to [facility] where she was evaluated and thought she may have CHF and transferred here for further management.  The patient denied any excessive salt intake.  Denied any prior known CHF.  She denied cough or shortness of breath.  Over here, the patient is not in any fluid overload.  She is also not showing any signs of CHF.  Her chest pain has also gone.  She uses nitro at home on and off as needed and it seems like the nitro took care of the pain.  At its high, the pain was 4 out of 10, it is mainly pressure in the left side.  No radiation.  She denied any particular aggravating factor or relieving factors.  The shortness of breath was sudden and again she got a dose of Lasix and that to and then went away.  Per the patient, her legs were swollen initially but the swelling has completely gone now.  PAST MEDICAL HISTORY:  Her past medical history is significant for hyperlipidemia, atrial fibrillation, history of CVA,  prior history of MI, not quite clear if it is truly MI, depression, UTI, allergic rhinitis.  ALLERGIES:  To CIPRO, PENICILLIN and SULFA.  CURRENT MEDICATIONS:  Aspirin, Wellbutrin, calcium, digoxin, meclizine, metoprolol, multivitamins, Macrodantin, nitroglycerin  sublingual, simvastatin, Coumadin and loratadine.  SOCIAL HISTORY:  [place]  She denied tobacco, alcohol or IV drug use.  FAMILY HISTORY:  Noncontributory.  REVIEW OF SYSTEMS:  All systems reviewed are negative except per HPI.  PHYSICAL EXAMINATION:  VITAL SIGNS:  On exam, temperature is 97.5, blood pressure 148/69 with pulse 65, respiratory rate is 20, sats 98% on room air. GENERAL:  Generally she is awake, alert, oriented, pleasant woman sitting in bed who is in no acute distress. HEENT:  PERRLA.  EOMI.  No significant pallor, no jaundice.  No rhinorrhea. NECK:  Supple.  No visible JVD, no lymphadenopathy. RESPIRATORY:  She has good air entry bilaterally.  No wheezes, no rales. No crackles. CARDIOVASCULAR SYSTEM:  She has S1-S2.  No audible murmur. ABDOMEN:  Soft full, nontender with positive bowel sounds. EXTREMITIES: No edema, cyanosis or clubbing. SKIN:  No rashes.  No ulcers. MUSCULOSKELETAL:  No joint swelling, tenderness or disfigurement.  LABORATORY DATA:  Sodium 142, potassium 4.0, chloride 105, CO2 of 26, glucose 123, BUN 15, creatinine 0.90, calcium 9.2.  Her digoxin level 0.6.  PT 21.9,  INR 1.88, total CK 87 with an MB 1.4 with troponin less than 0.30.  Her BNP is 1124.  White count 5.6, hemoglobin 11.8 with an MCV of 89.  Her platelet count is 215.  Chest x-ray showed changes of COPD with mild increased interstitial accentuation, there is question mild interstitial edema and borderline cardiac size.  EKG showed normal sinus rhythm at this point and the rate is normal at 75.  ASSESSMENT:  This is an 77 year old female presenting with what appeared to be like flash pulmonary edema which is completely gone at this point. She has some chest pain which could also an angina.  She probably has chronic angina based on her history.  No recent cardiac cath but recent echo was nearly normal with an EF of 55-60% in January of this  year.  PLAN: 1. Chest pain.  We will admit the patient for observation.  Check     serial cardiac enzymes, MI rule out.  If her enzymes are all     negative x3, she will probably benefit from some type of stress     testing.  We will not repeat EKG since she just had one a few     months ago unless she decompensates. 2. Atrial fibrillation.  She seemed to be in sinus rhythm, at this     point I will continue with the Coumadin and metoprolol for rate     control as well as digoxin. 3. CHF.  If anything this could be diastolic dysfunction since the     patient's EF was normal.  Again she seemed to have responded to     just one dose of Lasix which showed that this may be flash thing.     If this persists, we may repeat a 2-D echo but she has had one 6     months ago that was normal. 4. Hypertension.  Blood pressure is well controlled.  Continue with     current medications. 5. Hyperlipidemia.  I will continue with simvastatin. 6. Depression and anxiety.  Continue with the Wellbutrin.  Other than     that if the patient seems stable and if everything turns out fine,     she may be able to go home within a day or so.     Lonia Blood, M.D.     Verlin Grills  D:  11/29/2010  T:  11/29/2010  Job:  [id number]  Electronically Signed by Lonia Blood M.D. on 03/11/2011 02:35:26 PM

## 2011-03-13 ENCOUNTER — Emergency Department (HOSPITAL_COMMUNITY): Payer: Medicare Other

## 2011-03-13 ENCOUNTER — Emergency Department (HOSPITAL_COMMUNITY)
Admission: EM | Admit: 2011-03-13 | Discharge: 2011-03-13 | Disposition: A | Discharge status: Home / Self Care | Payer: Medicare Other | Attending: Emergency Medicine | Admitting: Emergency Medicine

## 2011-03-13 DIAGNOSIS — I10 Essential (primary) hypertension: Secondary | ICD-10-CM | POA: Insufficient documentation

## 2011-03-13 DIAGNOSIS — R197 Diarrhea, unspecified: Secondary | ICD-10-CM | POA: Insufficient documentation

## 2011-03-13 DIAGNOSIS — Z7982 Long term (current) use of aspirin: Secondary | ICD-10-CM | POA: Insufficient documentation

## 2011-03-13 DIAGNOSIS — E876 Hypokalemia: Secondary | ICD-10-CM | POA: Insufficient documentation

## 2011-03-13 DIAGNOSIS — R109 Unspecified abdominal pain: Secondary | ICD-10-CM | POA: Insufficient documentation

## 2011-03-13 DIAGNOSIS — Z7901 Long term (current) use of anticoagulants: Secondary | ICD-10-CM | POA: Insufficient documentation

## 2011-03-13 DIAGNOSIS — R112 Nausea with vomiting, unspecified: Secondary | ICD-10-CM | POA: Insufficient documentation

## 2011-03-13 DIAGNOSIS — R42 Dizziness and giddiness: Secondary | ICD-10-CM | POA: Insufficient documentation

## 2011-03-13 DIAGNOSIS — Z79899 Other long term (current) drug therapy: Secondary | ICD-10-CM | POA: Insufficient documentation

## 2011-03-13 DIAGNOSIS — E785 Hyperlipidemia, unspecified: Secondary | ICD-10-CM | POA: Insufficient documentation

## 2011-03-13 DIAGNOSIS — I2510 Atherosclerotic heart disease of native coronary artery without angina pectoris: Secondary | ICD-10-CM | POA: Insufficient documentation

## 2011-03-13 LAB — DIFFERENTIAL
Basophils Absolute: 0 10*3/uL (ref 0.0–0.1)
Basophils Relative: 0 % (ref 0–1)
Eosinophils Absolute: 0.1 10*3/uL (ref 0.0–0.7)
Eosinophils Relative: 1 % (ref 0–5)
Lymphocytes Relative: 16 % (ref 12–46)
Lymphs Abs: 1.3 10*3/uL (ref 0.7–4.0)
Monocytes Absolute: 0.6 10*3/uL (ref 0.1–1.0)
Monocytes Relative: 7 % (ref 3–12)
Neutro Abs: 6.2 10*3/uL (ref 1.7–7.7)
Neutrophils Relative %: 76 % (ref 43–77)

## 2011-03-13 LAB — COMPREHENSIVE METABOLIC PANEL
ALT: 36 U/L — ABNORMAL HIGH (ref 0–35)
AST: 39 U/L — ABNORMAL HIGH (ref 0–37)
Albumin: 4.2 g/dL (ref 3.5–5.2)
Alkaline Phosphatase: 109 U/L (ref 39–117)
BUN: 18 mg/dL (ref 6–23)
CO2: 28 mEq/L (ref 19–32)
Calcium: 9.7 mg/dL (ref 8.4–10.5)
Chloride: 98 mEq/L (ref 96–112)
Creatinine, Ser: 0.89 mg/dL (ref 0.50–1.10)
GFR calc Af Amer: 68 mL/min — ABNORMAL LOW (ref >90)
GFR calc non Af Amer: 59 mL/min — ABNORMAL LOW (ref >90)
Glucose, Bld: 100 mg/dL — ABNORMAL HIGH (ref 70–99)
Potassium: 3 mEq/L — ABNORMAL LOW (ref 3.5–5.1)
Sodium: 140 mEq/L (ref 135–145)
Total Bilirubin: 1.3 mg/dL — ABNORMAL HIGH (ref 0.3–1.2)
Total Protein: 7.6 g/dL (ref 6.0–8.3)

## 2011-03-13 LAB — URINALYSIS, ROUTINE W REFLEX MICROSCOPIC
Bilirubin Urine: NEGATIVE
Glucose, UA: NEGATIVE mg/dL
Hgb urine dipstick: NEGATIVE
Ketones, ur: NEGATIVE mg/dL
Nitrite: NEGATIVE
Protein, ur: NEGATIVE mg/dL
Specific Gravity, Urine: 1.01 (ref 1.005–1.030)
Urobilinogen, UA: 0.2 mg/dL (ref 0.0–1.0)
pH: 5.5 (ref 5.0–8.0)

## 2011-03-13 LAB — CBC
HCT: 43.1 % (ref 36.0–46.0)
Hemoglobin: 14.5 g/dL (ref 12.0–15.0)
MCH: 29.5 pg (ref 26.0–34.0)
MCHC: 33.6 g/dL (ref 30.0–36.0)
MCV: 87.6 fL (ref 78.0–100.0)
Platelets: 283 10*3/uL (ref 150–400)
RBC: 4.92 MIL/uL (ref 3.87–5.11)
RDW: 14 % (ref 11.5–15.5)
WBC: 8.2 10*3/uL (ref 4.0–10.5)

## 2011-03-13 LAB — URINE MICROSCOPIC-ADD ON

## 2011-03-13 LAB — LIPASE, BLOOD: Lipase: 33 U/L (ref 11–59)

## 2011-03-13 LAB — PROTIME-INR
INR: 1.71 — ABNORMAL HIGH (ref 0.00–1.49)
Prothrombin Time: 20.4 seconds — ABNORMAL HIGH (ref 11.6–15.2)

## 2011-03-13 MED ORDER — IOHEXOL 300 MG/ML  SOLN
100.0000 mL | Freq: Once | INTRAMUSCULAR | Status: AC | PRN
  Administered 2011-03-13: 100 mL via INTRAVENOUS

## 2011-03-18 ENCOUNTER — Observation Stay (HOSPITAL_COMMUNITY)
Admission: EM | Admit: 2011-03-18 | Discharge: 2011-03-20 | Disposition: A | Discharge status: Home / Self Care | Payer: Medicare Other | Attending: Family Medicine | Admitting: Family Medicine

## 2011-03-18 DIAGNOSIS — I5032 Chronic diastolic (congestive) heart failure: Secondary | ICD-10-CM | POA: Insufficient documentation

## 2011-03-18 DIAGNOSIS — E876 Hypokalemia: Secondary | ICD-10-CM | POA: Insufficient documentation

## 2011-03-18 DIAGNOSIS — F329 Major depressive disorder, single episode, unspecified: Secondary | ICD-10-CM | POA: Insufficient documentation

## 2011-03-18 DIAGNOSIS — Z8673 Personal history of transient ischemic attack (TIA), and cerebral infarction without residual deficits: Secondary | ICD-10-CM | POA: Insufficient documentation

## 2011-03-18 DIAGNOSIS — I509 Heart failure, unspecified: Secondary | ICD-10-CM | POA: Insufficient documentation

## 2011-03-18 DIAGNOSIS — E860 Dehydration: Secondary | ICD-10-CM | POA: Insufficient documentation

## 2011-03-18 DIAGNOSIS — E785 Hyperlipidemia, unspecified: Secondary | ICD-10-CM | POA: Insufficient documentation

## 2011-03-18 DIAGNOSIS — I4891 Unspecified atrial fibrillation: Secondary | ICD-10-CM | POA: Insufficient documentation

## 2011-03-18 DIAGNOSIS — R197 Diarrhea, unspecified: Secondary | ICD-10-CM | POA: Insufficient documentation

## 2011-03-18 DIAGNOSIS — R0789 Other chest pain: Secondary | ICD-10-CM | POA: Insufficient documentation

## 2011-03-18 DIAGNOSIS — H6690 Otitis media, unspecified, unspecified ear: Secondary | ICD-10-CM | POA: Insufficient documentation

## 2011-03-18 DIAGNOSIS — Z7901 Long term (current) use of anticoagulants: Secondary | ICD-10-CM | POA: Insufficient documentation

## 2011-03-18 DIAGNOSIS — F3289 Other specified depressive episodes: Secondary | ICD-10-CM | POA: Insufficient documentation

## 2011-03-18 DIAGNOSIS — Z7982 Long term (current) use of aspirin: Secondary | ICD-10-CM | POA: Insufficient documentation

## 2011-03-18 DIAGNOSIS — R112 Nausea with vomiting, unspecified: Principal | ICD-10-CM | POA: Insufficient documentation

## 2011-03-18 DIAGNOSIS — Z23 Encounter for immunization: Secondary | ICD-10-CM | POA: Insufficient documentation

## 2011-03-18 LAB — OCCULT BLOOD, POC DEVICE: Fecal Occult Bld: NEGATIVE

## 2011-03-18 LAB — URINALYSIS, ROUTINE W REFLEX MICROSCOPIC
Hgb urine dipstick: NEGATIVE
Leukocytes, UA: NEGATIVE
Protein, ur: NEGATIVE mg/dL
Urobilinogen, UA: 0.2 mg/dL (ref 0.0–1.0)

## 2011-03-18 LAB — DIFFERENTIAL
Basophils Absolute: 0 10*3/uL (ref 0.0–0.1)
Eosinophils Absolute: 0.1 10*3/uL (ref 0.0–0.7)
Eosinophils Relative: 1 % (ref 0–5)
Lymphocytes Relative: 22 % (ref 12–46)

## 2011-03-18 LAB — BASIC METABOLIC PANEL
Chloride: 104 mEq/L (ref 96–112)
GFR calc Af Amer: 68 mL/min — ABNORMAL LOW (ref >90)
Potassium: 3.6 mEq/L (ref 3.5–5.1)
Sodium: 141 mEq/L (ref 135–145)

## 2011-03-18 LAB — CBC
HCT: 39.2 % (ref 36.0–46.0)
Hemoglobin: 13.1 g/dL (ref 12.0–15.0)
MCH: 28.9 pg (ref 26.0–34.0)
MCHC: 33.4 g/dL (ref 30.0–36.0)
MCV: 86.3 fL (ref 78.0–100.0)
Platelets: 263 10*3/uL (ref 150–400)
RBC: 4.54 MIL/uL (ref 3.87–5.11)
RDW: 14.1 % (ref 11.5–15.5)
WBC: 7.5 10*3/uL (ref 4.0–10.5)

## 2011-03-18 LAB — PROTIME-INR
INR: 1.55 — ABNORMAL HIGH (ref 0.00–1.49)
Prothrombin Time: 18.9 seconds — ABNORMAL HIGH (ref 11.6–15.2)

## 2011-03-19 LAB — COMPREHENSIVE METABOLIC PANEL
AST: 18 U/L (ref 0–37)
BUN: 9 mg/dL (ref 6–23)
CO2: 25 mEq/L (ref 19–32)
Calcium: 8.2 mg/dL — ABNORMAL LOW (ref 8.4–10.5)
Creatinine, Ser: 0.79 mg/dL (ref 0.50–1.10)
GFR calc Af Amer: 87 mL/min — ABNORMAL LOW (ref >90)
GFR calc non Af Amer: 75 mL/min — ABNORMAL LOW (ref >90)
Glucose, Bld: 85 mg/dL (ref 70–99)

## 2011-03-19 LAB — PHOSPHORUS: Phosphorus: 3.2 mg/dL (ref 2.3–4.6)

## 2011-03-19 LAB — CBC
MCH: 29.3 pg (ref 26.0–34.0)
Platelets: 220 10*3/uL (ref 150–400)
RBC: 4 MIL/uL (ref 3.87–5.11)
RDW: 14.1 % (ref 11.5–15.5)

## 2011-03-19 LAB — MAGNESIUM: Magnesium: 1.8 mg/dL (ref 1.5–2.5)

## 2011-03-19 LAB — DIFFERENTIAL
Basophils Relative: 1 % (ref 0–1)
Eosinophils Absolute: 0.1 10*3/uL (ref 0.0–0.7)
Monocytes Relative: 10 % (ref 3–12)
Neutrophils Relative %: 53 % (ref 43–77)

## 2011-03-19 LAB — PROTIME-INR: Prothrombin Time: 21.7 seconds — ABNORMAL HIGH (ref 11.6–15.2)

## 2011-03-19 LAB — PRO B NATRIURETIC PEPTIDE: Pro B Natriuretic peptide (BNP): 464.9 pg/mL — ABNORMAL HIGH (ref 0–450)

## 2011-03-19 LAB — TSH: TSH: 0.864 u[IU]/mL (ref 0.350–4.500)

## 2011-03-20 ENCOUNTER — Ambulatory Visit: Payer: Medicare Other | Admitting: Internal Medicine

## 2011-03-20 LAB — URINE CULTURE
Colony Count: 85000
Special Requests: NEGATIVE

## 2011-03-20 LAB — BASIC METABOLIC PANEL
BUN: 8 mg/dL (ref 6–23)
CO2: 21 mEq/L (ref 19–32)
Calcium: 8.4 mg/dL (ref 8.4–10.5)
Chloride: 109 mEq/L (ref 96–112)
Creatinine, Ser: 0.76 mg/dL (ref 0.50–1.10)
Glucose, Bld: 103 mg/dL — ABNORMAL HIGH (ref 70–99)

## 2011-03-21 NOTE — H&P (Signed)
[name], [name] NO.:  [id number]  MEDICAL RECORD NO.:  [id number]  LOCATION:  WLED                         FACILITY:  [facility]  PHYSICIAN:  Kathlen Mody, MD       DATE OF BIRTH:  10-18-1925  DATE OF ADMISSION:  03/18/2011 DATE OF DISCHARGE:                             HISTORY & PHYSICAL   PRIMARY CARE PHYSICIAN:  Willow Ora, MD  CHIEF COMPLAINT:  Diarrhea since 1 week.  HISTORY OF PRESENT ILLNESS:  This is an 77 year old lady with a past medical history of chronic diastolic heart failure, atrial fibrillation, GERD, history of CVA, dysphagia, came in about 5 days ago to [facility] ER for nausea and vomiting over 2 weeks.  She was found to be hypokalemic and dehydrated.  She was hydrated and potassium was repleted, and was discharged home.  She comes back 5 days later, that is today with complaints of diarrhea which has worsened over the last 5 days, persistent nausea and occasional vomiting.  The patient had a urinary tract infection about 3 weeks ago and she was given Cefzil for her urinary tract infection.  She had persistent nausea and vomiting since 3 weeks, and diarrhea started about 1 week ago, watery diarrhea associated with mild abdominal discomfort.  Denies any fevers or chills. Vomiting has improved, nonbilious nonbloody vomiting.  No history of hematochezia or hematemesis.  Denies any history of chest pain, shortness of breath, palpitation, or syncopal episodes.  Denies any headache but complains of left ear ache since 2 days.  Denies any blurry vision.  The patient denies any tingling or numbness.  In [facility] ED, her stools were dark and stool for occult blood was negative, and hospitalist service was requested to admit the patient for evaluation and management of probably acute gastroenteritis with dehydration.  PAST MEDICAL HISTORY: 1. Chronic diastolic heart failure. 2. GERD. 3. Atrial fibrillation. 4. History of CVA. 5. Dysphagia  secondary to esophageal stricture. 6. Depression. 7. Dyslipidemia. 8. Allergic rhinitis.  REVIEW OF SYSTEMS:  See HPI, otherwise negative.  MEDICATIONS: 1. Aspirin 81 mg daily. 2. Wellbutrin 100 mg daily and half a tab on Tuesdays and Fridays. 3. Calcium and vitamin D 1 tab daily. 4. Cefzil 500 mg. 5. Ethacrynic acid 25 mg 2 in the morning and 1 in the afternoon. 6. Loratadine 10 mg daily as needed. 7. Meclizine 2 times a day. 8. Metoprolol 25 mg twice a day. 9. Multivitamin 1 tab daily. 10.Nitroglycerin as needed. 11.Phenergan as needed for nausea. 12.Zocor daily at bedtime. 13.Coumadin 2 mg, take as directed.  ALLERGIES:  Patient is allergic to:  1. PENICILLIN. 2. SULFA, SULFONAMIDE DERIVATIVES. 3. BACTRIM. 4. PREDNISONE. 5. FLAGYL. 6. HYDROCODONE. 7. CIPROFLOXACIN.  FAMILY HISTORY:  Positive for coronary artery disease.  SOCIAL HISTORY:  The patient lives by herself.  Denies smoking, EtOH, or any recreational drug use.  PAST SURGICAL HISTORY:  Cholecystectomy, hysterectomy, appendectomy, and cataract surgery.  PHYSICAL EXAMINATION:  VITAL SIGNS:  Today, temperature of 98, pulse of 85, blood pressure 128/69, respiratory rate 15, saturating 96% on room air. GENERAL:  On exam, she is alert, afebrile, comfortable, in no acute distress. HEENT EXAM:  Pupils reacting to light and accommodation. NECK:  No JVD. CARDIOVASCULAR:  S1, S2 heard.  No murmurs, rubs, or gallops. RESPIRATORY EXAM:  Chest clear to auscultation bilaterally.  No wheezing or rhonchi. ABDOMEN:  Soft, mildly tender in the right lower quadrant and left lower quadrant.  Bowel sounds are heard.  No signs of peritonitis.  No rebound tenderness. EXTREMITIES:  No pedal edema, cyanosis or clubbing.  Pulses palpable, symmetrical. NEUROLOGICAL:  Patient is alert, oriented x 3.  No motor or sensory deficits.  PERTINENT LABORATORY DATA:  The patient had point of care stool for occult blood which was  negative.  The CBC within normal limits.  Basic metabolic panel significant for glucose of 101 and INR of 1.5.  DIAGNOSTIC STUDIES:  The patient had a CT abdomen and pelvis on March 13, 2011, showed fluid fill, all into the level of descending colon, sigmoid diverticula.  No bowel inflammatory process, free fluid or free air.  ASSESSMENT AND PLAN:  This is an 77 year old lady, came in for persistent nausea, vomiting and watery diarrhea, and patient is being admitted for acute gastroenteritis and dehydration versus antibiotic- induced diarrhea versus diarrhea, rule out Clostridium difficile.  First start the patient on IV fluids, gentle hydration with normal saline as the patient has a history of diastolic heart failure, and start the patient on clear liquid diet.  If the patient is able to tolerate diet then fluids can be stopped.  Start the patient on clear liquid diet and advance the diet as tolerated.  IV Zofran for nausea and vomiting.  Send stool for Clostridium difficile, PCR.  Send stool for culture and ova and parasites.  Start the patient on p.o. Protonix 40 mg daily.  Since the patient has a history of esophageal stricture, has a history of dysphagia and GERD, if her nausea and vomiting does not improve and persist the day or 2, will definitely need to call gastroenterology consult for probable upper endoscopy to look at the esophageal stricture.  Atrial fibrillation, rate controlled, subtherapeutic INR.  Resume her Coumadin and rate controlling drugs.  Diastolic heart failure.  Currently, the patient appears dehydrated and we are hydrating her gently with normal saline IV fluids.  As soon as she tolerates regular diet, we will stop the fluids and resume her on ethacrynic acid.  Depression.  No suicidal ideations.  Continue with her Wellbutrin.  Hyperlipidemia.  Continue with her Zocor 40 mg daily.  History of cerebrovascular accident, on aspirin 81.  Continue the  same.  Deep venous thrombosis prophylaxis.  The patient is on Coumadin.  The patient is full code.          ______________________________ Kathlen Mody, MD     VA/MEDQ  D:  03/18/2011  T:  03/18/2011  Job:  [id number]  Electronically Signed by Kathlen Mody MD on 03/21/2011 03:04:08 PM

## 2011-03-22 LAB — STOOL CULTURE

## 2011-03-24 ENCOUNTER — Other Ambulatory Visit: Payer: Self-pay | Admitting: Internal Medicine

## 2011-03-24 MED ORDER — WARFARIN SODIUM 2 MG PO TABS: 2.0000 mg | ORAL_TABLET | ORAL | Status: DC

## 2011-03-24 NOTE — Telephone Encounter (Signed)
RF completed 

## 2011-03-24 NOTE — Discharge Summary (Signed)
Virginia Price, SEALES NO.:  [id number]  MEDICAL RECORD NO.:  [id number]  LOCATION:  1424                         FACILITY:  [facility]  PHYSICIAN:  Virginia Sacks, MD    DATE OF BIRTH:  1926-01-26  DATE OF ADMISSION:  03/18/2011 DATE OF DISCHARGE:  03/20/2011                              DISCHARGE SUMMARY   PRIMARY CARE PHYSICIAN:  Virginia Ora, MD in [place]  CONDITION ON DISCHARGE:  Improved.  DISPOSITION:  Home with home health RN assessment for disease management.  DISCHARGE DIAGNOSES: 1. Intermittent vomiting for 3 weeks, likely secondary to     gastroesophageal reflux disease. 2. Diarrhea, resolved since admission, etiology unclear. 3. Hypokalemia, resolved. 4. Atrial fibrillation, stable. 5. History of persistent noncardiac chest pain, stable. 6. History of diastolic congestive heart failure, stable.  HISTORY OF PRESENT ILLNESS:  This is an 77 year old woman who presented to the emergency room with a chief complaint of diarrhea for the last week with associated nausea and vomiting for the last 2 to 3 weeks. Stools were negative for occult blood.  She had recently been treated for urinary tract infection and was admitted for further evaluation and treatment.  HOSPITAL COURSE:  Virginia Price was admitted to the medical floor.  Since her admission, she has had no diarrhea.  Her C. Difficile by PCR was negative, and her nausea and vomiting have resolved.  She has been able to eat quite well and has no complaints in that regard.  She has had some intermittent discomfort in her chest.  Telemetry did reveal a brief episode of atrial fibrillation with rapid ventricular response that was transient in nature.  Reviewing records including Virginia Price consultation from July 2012 as well as previous Virginia Price cardiology notes, it seems that this chest pain is longstanding in nature and thought to be musculoskeletal in nature.  I did not see any change in its character  or anything to suggest at this point that it is cardiac related.  The patient does have a history of EGD back in approximately 2004 and an esophageal stricture that was dilated at that time.  I suspect GERD as a primary etiology here, perhaps the stricture is causing intermittent dysphagia.  Certainly, she is asymptomatic at this point and she is already on PPI therapy that was started during this admission.  I did discuss the case with Virginia Price who has seen her in the past for endoscopy, although it has been approximately 3 years.  He suggested continuing PPI therapy and follow up with primary care physician.  If her symptoms persist, he will be glad to see her in the office for consideration for further evaluation.  I have discussed the case with Virginia Price, reviewed the hospitalization with him.  He plans to follow her up in the office next week.  The patient was assessed by Physical and Occupational Therapy, and felt to have no outpatient needs.  CONSULTATIONS:  None.  PROCEDURES:  None.  IMAGING:  None this admission, however, she did have a CT of the abdomen and pelvis on October 15th, which essentially was unremarkable showing a fluid-filled colon to the level of descending colon, sigmoid diverticula,  there is no extraluminal bowel inflammatory process, free fluid or free air.  MICROBIOLOGY: 1. Urine culture showed multiple bacterial morphotypes present, none     predominant from October 20th. 2. Stool culture, October 20th, no suspicious colonies.  Currently in     process. 3. Clostridium difficile by PCR was negative.  PERTINENT LABORATORY STUDIES: 1. Fecal occult blood negative. 2. TSH within normal limits. 3. CBC is essentially unremarkable. 4. Discharge INR 2.21. 5. Basic metabolic panel is unremarkable. 6. Hepatic function panel, unremarkable.  DISCHARGE INSTRUCTIONS:  The patient will be discharged home today with home health RN assessment for monitoring of  medications and disease management.  Increase activity slowly.  Low-sodium, heart-healthy diet. Follow up with Virginia Price next week.  Call his office for an appointment.  DISCHARGE MEDICATIONS:  New:  Protonix 40 mg p.o. daily.  Resume the following medications; 1. Aspirin 81 mg p.o. daily. 2. Calcium carbonate and vitamin D over-the-counter p.o. daily. 3. Warfarin 2 mg 1 tablet daily except 1-1/2 tablets on Mondays and     Fridays. 4. [name] 25 mg 2 tablets in the morning 1 tablet in the afternoon. 5. Fluticasone 50 mcg 2 sprays nasally daily as needed for nasal     congestion. 6. Lopressor 50 mg 1/2 tablet p.o. b.i.d. 7. Meclizine 12.5 mg p.o. b.i.d. as needed for dizziness and vertigo. 8. Multivitamin p.o. daily. 9. Nitroglycerin sublingual 0.4 mg under the tongue every 5 minutes as     needed for chest pain up to 3 doses. 10.Zocor 40 mg p.o. at bedtime.  THINGS TO FOLLOW UP IN THE OUTPATIENT SETTING:  Suspected GERD, see discussion above.  Time coordinating discharge including conversation with her primary care physician and gastroenterologist, 38 minutes.     Virginia Sacks, MD     DG/MEDQ  D:  03/20/2011  T:  03/20/2011  Job:  [id number]  cc:   Virginia Ora, MD [phone] [place], [place] [address]  Electronically Signed by Virginia Price  on 03/24/2011 02:59:38 PM

## 2011-03-28 ENCOUNTER — Ambulatory Visit (INDEPENDENT_AMBULATORY_CARE_PROVIDER_SITE_OTHER): Payer: Medicare Other | Admitting: Internal Medicine

## 2011-03-28 ENCOUNTER — Encounter: Payer: Self-pay | Admitting: Internal Medicine

## 2011-03-28 VITALS — BP 120/68 | HR 72 | Temp 97.5°F | Resp 16 | Ht 66.54 in | Wt 156.1 lb

## 2011-03-28 DIAGNOSIS — I4891 Unspecified atrial fibrillation: Secondary | ICD-10-CM

## 2011-03-28 DIAGNOSIS — I509 Heart failure, unspecified: Secondary | ICD-10-CM

## 2011-03-28 DIAGNOSIS — R112 Nausea with vomiting, unspecified: Secondary | ICD-10-CM

## 2011-03-28 DIAGNOSIS — N390 Urinary tract infection, site not specified: Secondary | ICD-10-CM

## 2011-03-28 DIAGNOSIS — R319 Hematuria, unspecified: Secondary | ICD-10-CM

## 2011-03-28 LAB — POCT URINALYSIS DIPSTICK
Bilirubin, UA: NEGATIVE
Ketones, UA: NEGATIVE
Protein, UA: 30
Spec Grav, UA: 1.025
pH, UA: 5

## 2011-03-28 MED ORDER — FUROSEMIDE 20 MG PO TABS: 20.0000 mg | ORAL_TABLET | Freq: Every day | ORAL | Status: DC

## 2011-03-28 NOTE — Patient Instructions (Addendum)
Please go to the lab for the following: UA, urine culture ----dx recurrent  UTIs Same coumadin. Next check in 3 weeks

## 2011-03-28 NOTE — Assessment & Plan Note (Signed)
INR checked today.

## 2011-03-28 NOTE — Assessment & Plan Note (Signed)
See previous entries, volume well controlled  with edecrin however the patient now has discontinued the medication due to nausea and vomiting. Plan: Restart Lasix despite history of sulfa allergies, she has tolerated it before without problems. Knows to call if s/e or allergies

## 2011-03-28 NOTE — Progress Notes (Signed)
  Subjective:    Patient ID: Virginia Price, female    DOB: 1926-03-28, 77 y.o.   MRN: [id number]  HPI Hospital followup, admitted for 2 days, released last week. She had nausea, vomiting, uncontrollable diarrhea, stools were black but not bloody. Hospital records and labs reviewed, she responded well to conservative treatment. Symptoms were thought to be probably from GERD, was prescribed Protonix. She was also prescribed bentyl but decided not to take it. From the cardiovascular standpoint she was stable, she did have a short episode of atrial fibrillation with rapid ventricular response.  Past Medical History  Diagnosis Date  . Atrial fibrillation     Coumadin therapy,  Holter monitor, March, 2012, atrial fib heart rate ranges, no more bradycardia or tachycardia while on digoxin  . Chest pain     Catheterization normal 2004, /  nuclear 2006 no ischemia  . Hyperlipidemia   . Hypertension   . Stroke   . Allergic rhinitis   . GERD (gastroesophageal reflux disease)   . Diverticular disease   . Colon polyp   . Abnormal chest CT     "innumerable small nodules  . Abnormal head CT 06/16/2010    atroph y  . Cataract     bilateral  . Drug therapy     Coumadin... atrial fibrillation  . Malignant melanoma   . Memory loss   . Restless leg syndrome   . CVA (cerebral infarction)     October, 2003, left middle cerebral artery territory  . CHF (congestive heart failure)     Diastolic with fluid overload, normal systolic function,, July, 2012.  Atrial fibrillation  . Ejection fraction <     EF 55-60% echo, January, 2012  . Mitral regurgitation     Mild to moderate, echo, January, 2012   Past Surgical History  Procedure Date  . Cholecystectomy 1991  . Radical hysterectomy 1978  . Cataract extraction     bilateral  . Appendectomy   . Mohs surgery     03/2005      Review of Systems She is now feeling better, denies any fever or chills, no abdominal pain. After she went back home  from the hospital, she realizes that many of her symptoms coincide with taking edecrin so she stopped it and she feels better. No further diarrhea Mild dysuria yesterday, no dysuria today appear    Objective:   Physical Exam  Constitutional: She is oriented to person, place, and time. She appears well-developed and well-nourished.  Cardiovascular:       Irregular  Pulmonary/Chest: Effort normal and breath sounds normal. No respiratory distress. She has no wheezes. She has no rales.  Abdominal: Soft. Bowel sounds are normal. She exhibits no distension. There is no tenderness. There is no rebound and no guarding.  Musculoskeletal: She exhibits no edema.  Neurological: She is alert and oriented to person, place, and time.          Assessment & Plan:  Today , I spent more than 25  min with the patient, >50% of the time counseling, and /or reviewing the chart and labs ordered by other providers   Nausea, vomiting, diarrhea: Status post admission to hospital,  Sx better. Due to  GERD? Now on Protonix Due to edecrine ?  she self discontinued such medicine. See CHF

## 2011-03-28 NOTE — Assessment & Plan Note (Signed)
Was prescribed antibiotics 03-03-11, patient unsure of how many of them she took. Plan: Reculture her urine, treat if appropriate

## 2011-03-31 LAB — CULTURE, URINE COMPREHENSIVE: Colony Count: >=100000

## 2011-04-03 ENCOUNTER — Telehealth: Payer: Self-pay | Admitting: Internal Medicine

## 2011-04-03 MED ORDER — NITROFURANTOIN MONOHYD MACRO 100 MG PO CAPS: 100.0000 mg | ORAL_CAPSULE | Freq: Every day | ORAL | Status: AC

## 2011-04-03 NOTE — Telephone Encounter (Signed)
Patient aware of urine culture results, Macrobid twice a day for one week, then one tablet daily until she see me next. Prescriptions sent.

## 2011-04-10 ENCOUNTER — Other Ambulatory Visit: Payer: Self-pay | Admitting: Internal Medicine

## 2011-04-11 ENCOUNTER — Other Ambulatory Visit: Payer: Self-pay | Admitting: Internal Medicine

## 2011-04-11 NOTE — Telephone Encounter (Signed)
Done

## 2011-04-12 MED ORDER — WARFARIN SODIUM 2 MG PO TABS: 2.0000 mg | ORAL_TABLET | Freq: Every day | ORAL | Status: DC

## 2011-04-12 NOTE — Telephone Encounter (Signed)
Rx for coumadin sent to pharmacy 

## 2011-04-17 DIAGNOSIS — I4891 Unspecified atrial fibrillation: Secondary | ICD-10-CM

## 2011-04-17 DIAGNOSIS — I5030 Unspecified diastolic (congestive) heart failure: Secondary | ICD-10-CM

## 2011-04-17 DIAGNOSIS — Z8673 Personal history of transient ischemic attack (TIA), and cerebral infarction without residual deficits: Secondary | ICD-10-CM

## 2011-04-17 DIAGNOSIS — F329 Major depressive disorder, single episode, unspecified: Secondary | ICD-10-CM

## 2011-04-17 DIAGNOSIS — K5289 Other specified noninfective gastroenteritis and colitis: Secondary | ICD-10-CM

## 2011-04-18 ENCOUNTER — Ambulatory Visit (INDEPENDENT_AMBULATORY_CARE_PROVIDER_SITE_OTHER): Payer: Medicare Other | Admitting: *Deleted

## 2011-04-18 ENCOUNTER — Other Ambulatory Visit: Payer: Self-pay | Admitting: Internal Medicine

## 2011-04-18 DIAGNOSIS — I4891 Unspecified atrial fibrillation: Secondary | ICD-10-CM

## 2011-04-18 DIAGNOSIS — Z7901 Long term (current) use of anticoagulants: Secondary | ICD-10-CM

## 2011-04-18 MED ORDER — SIMVASTATIN 40 MG PO TABS: 40.0000 mg | ORAL_TABLET | Freq: Every day | ORAL | Status: DC

## 2011-04-18 NOTE — Patient Instructions (Addendum)
Return to office in 4 weeks for PT/INR  Continue current dose: Take 1 tab daily except 1 1/2  M,F

## 2011-04-18 NOTE — Telephone Encounter (Signed)
Rx sent to pharmacy   

## 2011-04-29 ENCOUNTER — Emergency Department (HOSPITAL_COMMUNITY)
Admission: EM | Admit: 2011-04-29 | Discharge: 2011-04-29 | Disposition: A | Discharge status: Home / Self Care | Payer: Medicare Other | Attending: Emergency Medicine | Admitting: Emergency Medicine

## 2011-04-29 ENCOUNTER — Encounter (HOSPITAL_COMMUNITY): Payer: Self-pay | Admitting: *Deleted

## 2011-04-29 ENCOUNTER — Emergency Department (HOSPITAL_COMMUNITY): Payer: Medicare Other

## 2011-04-29 DIAGNOSIS — M25469 Effusion, unspecified knee: Secondary | ICD-10-CM | POA: Insufficient documentation

## 2011-04-29 DIAGNOSIS — I4891 Unspecified atrial fibrillation: Secondary | ICD-10-CM | POA: Insufficient documentation

## 2011-04-29 DIAGNOSIS — M25562 Pain in left knee: Secondary | ICD-10-CM

## 2011-04-29 DIAGNOSIS — I10 Essential (primary) hypertension: Secondary | ICD-10-CM | POA: Insufficient documentation

## 2011-04-29 DIAGNOSIS — M25569 Pain in unspecified knee: Secondary | ICD-10-CM | POA: Insufficient documentation

## 2011-04-29 DIAGNOSIS — Z7901 Long term (current) use of anticoagulants: Secondary | ICD-10-CM | POA: Insufficient documentation

## 2011-04-29 DIAGNOSIS — Z8673 Personal history of transient ischemic attack (TIA), and cerebral infarction without residual deficits: Secondary | ICD-10-CM | POA: Insufficient documentation

## 2011-04-29 MED ORDER — ACETAMINOPHEN-CODEINE #3 300-30 MG PO TABS
1.0000 | ORAL_TABLET | ORAL | Status: DC | PRN
  Administered 2011-04-29: 1 via ORAL
  Filled 2011-04-29: qty 1

## 2011-04-29 MED ORDER — ACETAMINOPHEN-CODEINE #3 300-30 MG PO TABS: 1.0000 | ORAL_TABLET | Freq: Four times a day (QID) | ORAL | Status: AC | PRN

## 2011-04-29 NOTE — ED Provider Notes (Signed)
Medical screening examination/treatment/procedure(s) were conducted as a shared visit with non-physician practitioner(s) and myself.  I personally evaluated the patient during the encounter.. left knee shows edema and generalized tenderness. Pain with flexion and extension. Small effusion. Refer to her  for MRI  Donnetta Hutching, MD 04/29/11 2236

## 2011-04-29 NOTE — ED Provider Notes (Signed)
History     CSN: [id number] Arrival date & time: 04/29/2011  5:18 PM   First MD Initiated Contact with Patient 04/29/11 1909      Chief Complaint  Patient presents with  . Knee Pain    (Consider location/radiation/quality/duration/timing/severity/associated sxs/prior treatment) Patient is a 77 y.o. female presenting with knee pain. The history is provided by the patient and a relative.  Knee Pain This is a new problem. The current episode started today (She has chronic discomfort of the left knee but twisted today when attempting to stand, causing acute increase in pain and swelling.). The problem has been gradually worsening. Pertinent negatives include no chills or fever. The symptoms are aggravated by bending, walking and twisting. She has tried acetaminophen for the symptoms. The treatment provided no relief.    Past Medical History  Diagnosis Date  . Atrial fibrillation     Coumadin therapy,  Holter monitor, March, 2012, atrial fib heart rate ranges, no more bradycardia or tachycardia while on digoxin  . Chest pain     Catheterization normal 2004, /  nuclear 2006 no ischemia  . Hyperlipidemia   . Hypertension   . Stroke   . Allergic rhinitis   . GERD (gastroesophageal reflux disease)   . Diverticular disease   . Colon polyp   . Abnormal chest CT     "innumerable small nodules  . Abnormal head CT 06/16/2010    atroph y  . Cataract     bilateral  . Drug therapy     Coumadin... atrial fibrillation  . Malignant melanoma   . Memory loss   . Restless leg syndrome   . CVA (cerebral infarction)     October, 2003, left middle cerebral artery territory  . CHF (congestive heart failure)     Diastolic with fluid overload, normal systolic function,, July, 2012.  Atrial fibrillation  . Ejection fraction <     EF 55-60% echo, January, 2012  . Mitral regurgitation     Mild to moderate, echo, January, 2012    Past Surgical History  Procedure Date  . Cholecystectomy 1991  .  Radical hysterectomy 1978  . Cataract extraction     bilateral  . Appendectomy   . Mohs surgery     03/2005    Family History  Problem Relation Age of Onset  . Heart disease Mother   . Colon polyps Mother   . Heart disease Father     History  Substance Use Topics  . Smoking status: Never Smoker   . Smokeless tobacco: Not on file  . Alcohol Use: No    OB History    Grav Para Term Preterm Abortions TAB SAB Ect Mult Living                  Review of Systems  Constitutional: Negative for fever and chills.  HENT: Negative.   Respiratory: Negative.   Cardiovascular: Negative.   Gastrointestinal: Negative.   Musculoskeletal: Negative.        See HPI.  Skin: Negative.   Neurological: Negative.     Allergies  Ciprofloxacin; [name]; Hydrocodone-acetaminophen; Metronidazole; Penicillins; Prednisone; Sulfamethoxazole w/trimethoprim; Sulfonamide derivatives; and Tramadol hcl  Home Medications   Current Outpatient Rx  Name Route Sig Dispense Refill  . ASPIRIN 81 MG PO TABS Oral Take 81 mg by mouth daily.      . BUPROPION HCL 100 MG PO TABS  1 tab daily and half tab on Tuesday and friday    . CALCIUM-VITAMIN  D 250-125 MG-UNIT PO TABS Oral Take 1 tablet by mouth daily.      . FUROSEMIDE 20 MG PO TABS Oral Take 1 tablet (20 mg total) by mouth daily. 30 tablet 11  . MECLIZINE HCL 12.5 MG PO TABS  TAKE 1 TABLET BY MOUTH TWO TIMES A DAY 60 tablet 6  . METOPROLOL TARTRATE 50 MG PO TABS       . MULTIVITAMINS PO TABS Oral Take 1 tablet by mouth daily.      Marland Kitchen NITROGLYCERIN 0.4 MG/SPRAY TL SOLN Sublingual Place 1 spray under the tongue every 5 (five) minutes as needed. Chest pain    . SIMVASTATIN 40 MG PO TABS Oral Take 1 tablet (40 mg total) by mouth at bedtime. 30 tablet 1  . WARFARIN SODIUM 2 MG PO TABS Oral Take 2-3 mg by mouth daily. Takes 1 and 1/2 tablets on mondays and fridays for a 3mg  dosage and takes 1 tablet daily on sundays, tuesdays, wednesdays, thursdays, and saturdays  for 2mg  dosage     . WARFARIN SODIUM 2 MG PO TABS       . LORATADINE 10 MG PO TABS Oral Take 10 mg by mouth daily as needed. Allergies       BP 131/57  Pulse 78  Temp(Src) 98 F (36.7 C) (Oral)  Resp 18  SpO2 100%  Physical Exam  Constitutional: She is oriented to person, place, and time. She appears well-developed and well-nourished.  Neck: Normal range of motion.  Pulmonary/Chest: Effort normal.  Musculoskeletal: She exhibits no edema.       Left knee moderately swollen without bony abnormality. Tender to touch anterolaterally. Joint stable. Thigh and calf nontender.  Neurological: She is alert and oriented to person, place, and time.  Skin: Skin is warm and dry.    ED Course  Procedures (including critical care time)  Labs Reviewed - No data to display Dg Knee Complete 4 Views Left  04/29/2011  *RADIOLOGY REPORT*  Clinical Data: Left knee pain and swelling.  LEFT KNEE - COMPLETE 4+ VIEW  Comparison: None  Findings: There are minimal degenerative changes for age.  No acute fracture or osteochondral lesion.  There is a suprapatellar knee joint effusion noted.  IMPRESSION: No acute bony findings and minimal degenerative changes for age. Small joint effusion.  Original Report Authenticated By: P. Loralie Champagne, M.D.     No diagnosis found.    MDM          Rodena Medin, PA 04/29/11 2156

## 2011-04-29 NOTE — ED Notes (Signed)
Pt stood this morning left knee "popped"  Unable to bear weight since, swelling noted

## 2011-05-16 ENCOUNTER — Encounter: Payer: Self-pay | Admitting: *Deleted

## 2011-05-16 ENCOUNTER — Ambulatory Visit (INDEPENDENT_AMBULATORY_CARE_PROVIDER_SITE_OTHER): Payer: Medicare Other | Admitting: *Deleted

## 2011-05-16 VITALS — BP 115/65 | HR 88 | Temp 97.6°F | Ht 66.0 in | Wt 152.2 lb

## 2011-05-16 DIAGNOSIS — Z7901 Long term (current) use of anticoagulants: Secondary | ICD-10-CM

## 2011-05-16 LAB — POCT INR: INR: 2

## 2011-05-16 NOTE — Patient Instructions (Signed)
Todays PT/INR 2.0 No changes to medication dosage Continue to take Take 1 tab daily except 1 1/2  M,F Re-check in one month

## 2011-06-14 ENCOUNTER — Ambulatory Visit (INDEPENDENT_AMBULATORY_CARE_PROVIDER_SITE_OTHER): Payer: Medicare Other | Admitting: *Deleted

## 2011-06-14 DIAGNOSIS — I4891 Unspecified atrial fibrillation: Secondary | ICD-10-CM

## 2011-06-14 DIAGNOSIS — Z7901 Long term (current) use of anticoagulants: Secondary | ICD-10-CM

## 2011-06-14 LAB — POCT INR: INR: 2.5

## 2011-06-14 NOTE — Patient Instructions (Addendum)
Return to office in 3 weeks for PT/INR  New dosing: Take 1 tab daily except 1 1/2  W

## 2011-06-19 ENCOUNTER — Other Ambulatory Visit: Payer: Self-pay | Admitting: Internal Medicine

## 2011-06-19 MED ORDER — SIMVASTATIN 40 MG PO TABS: 40.0000 mg | ORAL_TABLET | Freq: Every day | ORAL | Status: DC

## 2011-06-19 NOTE — Telephone Encounter (Signed)
Done

## 2011-07-03 ENCOUNTER — Ambulatory Visit (INDEPENDENT_AMBULATORY_CARE_PROVIDER_SITE_OTHER): Payer: Medicare Other | Admitting: *Deleted

## 2011-07-03 DIAGNOSIS — Z7901 Long term (current) use of anticoagulants: Secondary | ICD-10-CM

## 2011-07-03 DIAGNOSIS — I4891 Unspecified atrial fibrillation: Secondary | ICD-10-CM

## 2011-07-03 LAB — POCT INR: INR: 2.4

## 2011-07-03 NOTE — Patient Instructions (Signed)
Return to office in 4 weeks  Continue current dose: Take 1 tab daily except 1 1/2  W

## 2011-08-01 ENCOUNTER — Ambulatory Visit (INDEPENDENT_AMBULATORY_CARE_PROVIDER_SITE_OTHER): Payer: Medicare Other

## 2011-08-01 DIAGNOSIS — I4891 Unspecified atrial fibrillation: Secondary | ICD-10-CM

## 2011-08-01 DIAGNOSIS — Z79899 Other long term (current) drug therapy: Secondary | ICD-10-CM

## 2011-08-01 LAB — POCT INR: INR: 1.7

## 2011-08-01 NOTE — Patient Instructions (Signed)
INR is low today but has been okay he for several weeks with current dose. Good medication compliance. Plan: No change, recheck in 10 days. JP

## 2011-08-11 ENCOUNTER — Ambulatory Visit (INDEPENDENT_AMBULATORY_CARE_PROVIDER_SITE_OTHER): Payer: Medicare Other | Admitting: *Deleted

## 2011-08-11 VITALS — BP 120/78 | HR 80

## 2011-08-11 DIAGNOSIS — I4891 Unspecified atrial fibrillation: Secondary | ICD-10-CM

## 2011-08-11 DIAGNOSIS — Z79899 Other long term (current) drug therapy: Secondary | ICD-10-CM

## 2011-08-11 LAB — POCT INR: INR: 2.1

## 2011-08-11 NOTE — Patient Instructions (Signed)
No change, 3 weeks JP 

## 2011-08-31 ENCOUNTER — Encounter: Payer: Self-pay | Admitting: Cardiology

## 2011-08-31 ENCOUNTER — Ambulatory Visit (INDEPENDENT_AMBULATORY_CARE_PROVIDER_SITE_OTHER): Payer: Medicare Other | Admitting: Cardiology

## 2011-08-31 VITALS — BP 154/78 | HR 83 | Resp 16 | Ht 63.0 in | Wt 164.4 lb

## 2011-08-31 DIAGNOSIS — I340 Nonrheumatic mitral (valve) insufficiency: Secondary | ICD-10-CM

## 2011-08-31 DIAGNOSIS — R9430 Abnormal result of cardiovascular function study, unspecified: Secondary | ICD-10-CM

## 2011-08-31 DIAGNOSIS — R0989 Other specified symptoms and signs involving the circulatory and respiratory systems: Secondary | ICD-10-CM

## 2011-08-31 DIAGNOSIS — R42 Dizziness and giddiness: Secondary | ICD-10-CM

## 2011-08-31 DIAGNOSIS — IMO0002 Reserved for concepts with insufficient information to code with codable children: Secondary | ICD-10-CM | POA: Insufficient documentation

## 2011-08-31 DIAGNOSIS — R079 Chest pain, unspecified: Secondary | ICD-10-CM

## 2011-08-31 DIAGNOSIS — I059 Rheumatic mitral valve disease, unspecified: Secondary | ICD-10-CM

## 2011-08-31 DIAGNOSIS — I509 Heart failure, unspecified: Secondary | ICD-10-CM

## 2011-08-31 DIAGNOSIS — I4891 Unspecified atrial fibrillation: Secondary | ICD-10-CM

## 2011-08-31 LAB — BASIC METABOLIC PANEL
Calcium: 9.4 mg/dL (ref 8.4–10.5)
Chloride: 105 mEq/L (ref 96–112)
Creatinine, Ser: 1 mg/dL (ref 0.4–1.2)
Sodium: 142 mEq/L (ref 135–145)

## 2011-08-31 LAB — CBC WITH DIFFERENTIAL/PLATELET
Basophils Absolute: 0 10*3/uL (ref 0.0–0.1)
Eosinophils Absolute: 0.2 10*3/uL (ref 0.0–0.7)
Hemoglobin: 13.4 g/dL (ref 12.0–15.0)
Lymphocytes Relative: 24.2 % (ref 12.0–46.0)
Lymphs Abs: 1.7 10*3/uL (ref 0.7–4.0)
MCHC: 33.2 g/dL (ref 30.0–36.0)
Neutro Abs: 4.5 10*3/uL (ref 1.4–7.7)
Platelets: 222 10*3/uL (ref 150.0–400.0)
RDW: 15.9 % — ABNORMAL HIGH (ref 11.5–14.6)

## 2011-08-31 MED ORDER — FUROSEMIDE 20 MG PO TABS: 40.0000 mg | ORAL_TABLET | Freq: Every day | ORAL | Status: DC

## 2011-08-31 MED ORDER — FUROSEMIDE 40 MG PO TABS: 40.0000 mg | ORAL_TABLET | Freq: Every day | ORAL | Status: DC

## 2011-08-31 NOTE — Assessment & Plan Note (Signed)
She does have mitral regurgitation. She had an echo in January, 2012. Ejection fraction was good. She is mild to moderate MR. She does not need a followup echo now.

## 2011-08-31 NOTE — Assessment & Plan Note (Signed)
At this point her dizziness sounds like vertigo. She is on some meclizine. She'll followup with her primary physician. This does not sound orthostatic.

## 2011-08-31 NOTE — Patient Instructions (Addendum)
Your physician recommends that you schedule a follow-up appointment in: 5-6 weeks  Your physician has recommended you make the following change in your medication: Increase your Lasix to 40mg  daily.  Take two pills of the Lasix (furosemide) that you have at home until you run out.  The new rx will be one tablet daily.  Your physician recommends that you return for lab work in: today (cbc, bmet)

## 2011-08-31 NOTE — Progress Notes (Signed)
HPI Patient is here for followup of atrial fibrillation. She's not feeling any significant palpitations. She does have some dizziness that sounds like vertigo. She is receiving some meclizine. She mentions that she has had shortness of breath both with exertion. And also she may have some orthopnea. She does not complaining of edema but feels that her belly is swollen. Her weight is increased since her last visit.  Allergies  Allergen Reactions  . Ciprofloxacin Hives  . [name] (Ethacrynic Acid)   . Hydrocodone-Acetaminophen     REACTION: nausea  . Metronidazole   . Penicillins   . Prednisone     REACTION: ? reaction  . Sulfamethoxazole W/Trimethoprim   . Sulfonamide Derivatives   . Tramadol Hcl     Current Outpatient Prescriptions  Medication Sig Dispense Refill  . aspirin 81 MG tablet Take 81 mg by mouth daily.        Marland Kitchen buPROPion (WELLBUTRIN) 100 MG tablet 1 tab daily and 1 and a half tab on Tuesday and friday      . calcium-vitamin D (OSCAL) 250-125 MG-UNIT per tablet Take 1 tablet by mouth daily.        . fesoterodine (TOVIAZ) 4 MG TB24 Take 4 mg by mouth daily.      . furosemide (LASIX) 20 MG tablet Take 1 tablet (20 mg total) by mouth daily.  30 tablet  11  . loratadine (CLARITIN) 10 MG tablet Take 10 mg by mouth daily as needed. Allergies       . meclizine (ANTIVERT) 12.5 MG tablet TAKE 1 TABLET BY MOUTH TWO TIMES A DAY  60 tablet  6  . metoprolol (LOPRESSOR) 50 MG tablet        . multivitamin (THERAGRAN) per tablet Take 1 tablet by mouth daily.        . nitroGLYCERIN (NITROLINGUAL) 0.4 MG/SPRAY spray Place 1 spray under the tongue every 5 (five) minutes as needed. Chest pain      . simvastatin (ZOCOR) 40 MG tablet Take 1 tablet (40 mg total) by mouth at bedtime.  30 tablet  2  . warfarin (COUMADIN) 2 MG tablet Take 2-3 mg by mouth daily. Takes 1 and 1/2 tablets on mondays and fridays for a 3mg  dosage and takes 1 tablet daily on sundays, tuesdays, wednesdays, thursdays, and  saturdays for 2mg  dosage         History   Social History  . Marital Status: Widowed    Spouse Name: N/A    Number of Children: 3  . Years of Education: N/A   Occupational History  .     Social History Main Topics  . Smoking status: Never Smoker   . Smokeless tobacco: Never Used  . Alcohol Use: No  . Drug Use: Not on file  . Sexually Active: Not on file   Other Topics Concern  . Not on file   Social History Narrative   Lives by herself, 1 daughter in [place], 1 son in [place], 1 daughter in [place]-- still drives     Family History  Problem Relation Age of Onset  . Heart disease Mother   . Colon polyps Mother   . Heart disease Father     Past Medical History  Diagnosis Date  . Atrial fibrillation     Coumadin therapy,  Holter monitor, March, 2012, atrial fib heart rate ranges, no more bradycardia or tachycardia while on digoxin  . Chest pain     Catheterization normal 2004, /  nuclear 2006  no ischemia  . Hyperlipidemia   . Hypertension   . Stroke   . Allergic rhinitis   . GERD (gastroesophageal reflux disease)   . Diverticular disease   . Colon polyp   . Abnormal chest CT     "innumerable small nodules  . Abnormal head CT 06/16/2010    atroph y  . Cataract     bilateral  . Drug therapy     Coumadin... atrial fibrillation  . Malignant melanoma   . Memory loss   . Restless leg syndrome   . CVA (cerebral infarction)     October, 2003, left middle cerebral artery territory  . CHF (congestive heart failure)     Diastolic with fluid overload, normal systolic function,, July, 2012.  Atrial fibrillation  . Ejection fraction <     EF 55-60% echo, January, 2012  . Mitral regurgitation     Mild to moderate, echo, January, 2012    Past Surgical History  Procedure Date  . Cholecystectomy 1991  . Radical hysterectomy 1978  . Cataract extraction     bilateral  . Appendectomy   . Mohs surgery     03/2005    ROS  Patient denies fever, chills, headache,  sweats, rash, change in vision, change in hearing, chest pain, cough, nausea vomiting, urinary symptoms. All other systems are reviewed and are negative.  PHYSICAL EXAM Patient is oriented to person time and place. Affect is normal. There is no jugulovenous distention. Lungs are clear. Respiratory effort is nonlabored. Cardiac exam reveals S1 and S2. The rhythm is irregularly irregular. The abdomen is mildly protuberant. There is no significant peripheral edema. There no musculoskeletal deformities. There are no skin rashes .  Filed Vitals:   08/31/11 1052  BP: 154/78  Pulse: 83  Resp: 16  Height: 5\' 3"  (1.6 m)  Weight: 164 lb 6.4 oz (74.571 kg)   EKG is done today and reviewed by me. She has atrial fibrillation. The rate is controlled.  ASSESSMENT & PLAN

## 2011-08-31 NOTE — Assessment & Plan Note (Signed)
Atrial fibrillation rate is controlled. No change in therapy. She'll continue on Coumadin.

## 2011-08-31 NOTE — Assessment & Plan Note (Signed)
The patient may be volume overloaded at this time. It is possible that she keeps her fluid in her abdomen. I'll increase her Lasix to 40 mg daily. We'll then see her back for followup.

## 2011-08-31 NOTE — Assessment & Plan Note (Signed)
She's not having any significant chest pain. No further workup. 

## 2011-09-05 ENCOUNTER — Other Ambulatory Visit: Payer: Self-pay | Admitting: Internal Medicine

## 2011-09-05 MED ORDER — METOPROLOL TARTRATE 50 MG PO TABS: 25.0000 mg | ORAL_TABLET | Freq: Two times a day (BID) | ORAL | Status: DC

## 2011-09-05 NOTE — Telephone Encounter (Signed)
Refill Metoprolol Tarta 50mg  Tab Qty 60  Take 1/2 tablet by mouth twice a day  Last fill 2.3.13

## 2011-09-05 NOTE — Telephone Encounter (Signed)
Refill done.  

## 2011-09-08 ENCOUNTER — Ambulatory Visit (INDEPENDENT_AMBULATORY_CARE_PROVIDER_SITE_OTHER): Payer: Medicare Other | Admitting: Internal Medicine

## 2011-09-08 VITALS — BP 114/74 | HR 69 | Temp 98.1°F | Wt 161.0 lb

## 2011-09-08 DIAGNOSIS — Z0000 Encounter for general adult medical examination without abnormal findings: Secondary | ICD-10-CM

## 2011-09-08 DIAGNOSIS — I509 Heart failure, unspecified: Secondary | ICD-10-CM

## 2011-09-08 DIAGNOSIS — I4891 Unspecified atrial fibrillation: Secondary | ICD-10-CM

## 2011-09-08 DIAGNOSIS — N318 Other neuromuscular dysfunction of bladder: Secondary | ICD-10-CM

## 2011-09-08 DIAGNOSIS — E785 Hyperlipidemia, unspecified: Secondary | ICD-10-CM

## 2011-09-08 LAB — POCT URINALYSIS DIPSTICK
Leukocytes, UA: NEGATIVE
Nitrite, UA: NEGATIVE
pH, UA: 5

## 2011-09-08 LAB — POCT INR: INR: 2.2

## 2011-09-08 NOTE — Assessment & Plan Note (Signed)
Recently, cardiology increased her Lasix because she complained of feeling bloated. Feels slightly less bloated. See physical exam. Plan: CMP

## 2011-09-08 NOTE — Assessment & Plan Note (Addendum)
Patient changed her Coumadin a week ago, currently taking 2 mg daily. INR today 2.2 Plan: recheck in 6 days

## 2011-09-08 NOTE — Assessment & Plan Note (Signed)
Was given toviaz by neurology b/c felt "bloated", no help. rec to d/c

## 2011-09-08 NOTE — Progress Notes (Signed)
  Subjective:    Patient ID: Virginia Price, female    DOB: 1925/11/19, 78 y.o.   MRN: [id number]  HPI Here for Medicare AWV: 1. Risk factors based on Past M, S, F history: yes  2. Physical Activities: goes shopping , does some house cleaning. No yard work or formal exercise  3. Depression/mood: symptoms well controlled on current meds  4. Hearing: some problems, no worse than before, has seen an audiologist before  5. ADL's: independent , still drives  6. Fall Risk: no recent falls, dizzy when stands up from bed,  prevention discussed   7. Home Safety: does feels safe at home  8. Height, weight, &visual acuity: see VS. vision ok w/ glasses  9. Counseling: see below  10. Labs ordered based on risk factors: yes  11.           Referral Coordination: if needed  12.           Care Plan: see below  13.            Cognitive Assessment : memory, alertness  and motor skills seem appropriate  additionally we discussed the following Atrial fibrillation-- due for INR; a week ago, she self change her Coumadin to 2 mg one tablet daily Hyperlipidemia-- good medication compliance  Hypertension-- good medication compliance, BP slightly elevated at cardiology last week, ambulatory BP is normal, BP today normal. She has been complaining of lower abdominal bloating, her neurologist prescribed toviaz which didn't help much, cardiology increase Lasix to 2 tablets daily  thinking that she was probably retaining fluid. Since she increased Lasix, she is slightly better.  Past Medical History: Atrial fibrillation (Dx 10-03 per Event monitor) Coronary artery disease CHF --Diastolic with fluid overload, normal systolic function,, July, 2012 Hyperlipidemia Hypertension Cerebrovascular accident, hx  (10-03 L MCA territory) Allergic rhinitis GERD, h/o esophageal stricture ? 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  Alcohol Use - no   Review of Systems No chest pain, dyspnea on exertion is at baseline. No nausea, vomiting, diarrhea. No dysuria or gross hematuria No vaginal discharge or vaginal bleeding.     Objective:   Physical Exam General:  alert, well-developed, and well-nourished.   Neck:  no masses, no thyromegaly, and normal carotid upstroke.   Breasts:  No mass, nodules, thickening, tenderness, bulging, retraction, inflamation, nipple discharge or skin changes noted.  no axillary lymphadenopathy  Lungs:  normal respiratory effort, no intercostal retractions, no accessory muscle use, and normal breath sounds.   Heart:  irreg Abdomen:  soft, no masses, no guarding, and no rigidity.  Lower abdomen was slightly tender initially and felt slightly full however after she urinates, exam was basically normal. Extremities: No edema Psych:  not anxious appearing and not depressed appearing.        Assessment & Plan:

## 2011-09-08 NOTE — Patient Instructions (Addendum)
Please come back fasting in 6 days INR-- a fibrillation  FLP--- dx  hyperlipidemia CMP --- dx hypertension

## 2011-09-08 NOTE — Assessment & Plan Note (Signed)
Due for labs

## 2011-09-10 ENCOUNTER — Encounter: Payer: Self-pay | Admitting: Internal Medicine

## 2011-09-10 DIAGNOSIS — Z0000 Encounter for general adult medical examination without abnormal findings: Secondary | ICD-10-CM | POA: Insufficient documentation

## 2011-09-10 NOTE — Assessment & Plan Note (Signed)
Td 2006, pneumonia shot 11-2007, Shingles  shot 2008  Cscope 10-04: incomplete but (-),  reports she did have a f/u  BE  (Dr Russella Dar per pt) Cscope again 11-2007 incomplete, then had a ACBE 02-2008:  "Study is severely limited for detection of polyps" next colonoscopy per GI  Dexa: 2003 , DEXA 09-2007 and DEXA 09-2009 normal breast exam normal MMG--- due, plans to schedule  See previous CPX, no further PAPs  diet and exercise discussed  Recently feeling bloated at the lower abdomen. Patient will call if the bloating feeling continue, may need and abdomen/pelvic ultrasound

## 2011-09-14 ENCOUNTER — Other Ambulatory Visit (INDEPENDENT_AMBULATORY_CARE_PROVIDER_SITE_OTHER): Payer: Medicare Other

## 2011-09-14 DIAGNOSIS — E039 Hypothyroidism, unspecified: Secondary | ICD-10-CM

## 2011-09-14 DIAGNOSIS — I4891 Unspecified atrial fibrillation: Secondary | ICD-10-CM

## 2011-09-14 DIAGNOSIS — E785 Hyperlipidemia, unspecified: Secondary | ICD-10-CM

## 2011-09-14 DIAGNOSIS — I10 Essential (primary) hypertension: Secondary | ICD-10-CM

## 2011-09-14 LAB — COMPREHENSIVE METABOLIC PANEL
ALT: 17 U/L (ref 0–35)
Albumin: 4 g/dL (ref 3.5–5.2)
CO2: 29 mEq/L (ref 19–32)
Calcium: 9.1 mg/dL (ref 8.4–10.5)
Chloride: 103 mEq/L (ref 96–112)
GFR: 60.39 mL/min (ref >60.00)
Potassium: 3.2 mEq/L — ABNORMAL LOW (ref 3.5–5.1)
Sodium: 142 mEq/L (ref 135–145)
Total Bilirubin: 1.2 mg/dL (ref 0.3–1.2)
Total Protein: 6.9 g/dL (ref 6.0–8.3)

## 2011-09-14 LAB — LIPID PANEL: Total CHOL/HDL Ratio: 4

## 2011-09-21 ENCOUNTER — Other Ambulatory Visit: Payer: Self-pay | Admitting: Internal Medicine

## 2011-09-21 MED ORDER — EZETIMIBE-SIMVASTATIN 10-40 MG PO TABS: 1.0000 | ORAL_TABLET | Freq: Every day | ORAL | Status: DC

## 2011-09-21 MED ORDER — SIMVASTATIN 40 MG PO TABS: 40.0000 mg | ORAL_TABLET | Freq: Every day | ORAL | Status: DC

## 2011-09-21 MED ORDER — POTASSIUM CHLORIDE ER 10 MEQ PO TBCR: 10.0000 meq | EXTENDED_RELEASE_TABLET | Freq: Two times a day (BID) | ORAL | Status: DC

## 2011-09-21 NOTE — Telephone Encounter (Signed)
Refill done.  

## 2011-09-21 NOTE — Telephone Encounter (Signed)
Refill for Simvastatin 40MG  Tablet Qty 30 Take 1-tablet by mouth at bedtime Last filled 3.20.13  Last OV 4.14.13

## 2011-10-05 ENCOUNTER — Ambulatory Visit: Payer: Medicare Other | Admitting: Cardiology

## 2011-10-10 ENCOUNTER — Ambulatory Visit: Payer: Medicare Other

## 2011-10-10 ENCOUNTER — Encounter: Payer: Self-pay | Admitting: Internal Medicine

## 2011-10-11 ENCOUNTER — Ambulatory Visit (INDEPENDENT_AMBULATORY_CARE_PROVIDER_SITE_OTHER): Payer: Medicare Other | Admitting: *Deleted

## 2011-10-11 VITALS — BP 118/68 | HR 87 | Temp 97.3°F | Wt 165.0 lb

## 2011-10-11 DIAGNOSIS — I4891 Unspecified atrial fibrillation: Secondary | ICD-10-CM

## 2011-10-11 DIAGNOSIS — Z7901 Long term (current) use of anticoagulants: Secondary | ICD-10-CM

## 2011-10-11 NOTE — Patient Instructions (Signed)
Return to office in 3 weeks  Continue current dose: Take 1 tab daily

## 2011-10-21 ENCOUNTER — Other Ambulatory Visit: Payer: Self-pay | Admitting: Internal Medicine

## 2011-10-24 NOTE — Telephone Encounter (Signed)
Refill done.  

## 2011-11-01 ENCOUNTER — Other Ambulatory Visit: Payer: Self-pay | Admitting: Cardiology

## 2011-11-01 NOTE — Telephone Encounter (Signed)
Refilled antivert

## 2011-11-02 ENCOUNTER — Telehealth: Payer: Self-pay | Admitting: Internal Medicine

## 2011-11-02 ENCOUNTER — Other Ambulatory Visit: Payer: Self-pay | Admitting: Cardiology

## 2011-11-02 MED ORDER — FUROSEMIDE 20 MG PO TABS: 40.0000 mg | ORAL_TABLET | Freq: Every day | ORAL | Status: DC

## 2011-11-02 MED ORDER — SIMVASTATIN 40 MG PO TABS: 40.0000 mg | ORAL_TABLET | Freq: Every day | ORAL | Status: DC

## 2011-11-02 MED ORDER — MECLIZINE HCL 12.5 MG PO TABS: 12.5000 mg | ORAL_TABLET | Freq: Two times a day (BID) | ORAL | Status: DC

## 2011-11-02 NOTE — Telephone Encounter (Signed)
Refill done.  

## 2011-11-02 NOTE — Telephone Encounter (Signed)
Refill: Simvastatin 40mg tablet. 90 day supply °

## 2011-11-03 ENCOUNTER — Ambulatory Visit (INDEPENDENT_AMBULATORY_CARE_PROVIDER_SITE_OTHER): Payer: Medicare Other | Admitting: Internal Medicine

## 2011-11-03 VITALS — BP 118/76 | HR 73 | Temp 97.8°F | Wt 165.0 lb

## 2011-11-03 DIAGNOSIS — R062 Wheezing: Secondary | ICD-10-CM | POA: Insufficient documentation

## 2011-11-03 DIAGNOSIS — I4891 Unspecified atrial fibrillation: Secondary | ICD-10-CM

## 2011-11-03 DIAGNOSIS — I10 Essential (primary) hypertension: Secondary | ICD-10-CM

## 2011-11-03 DIAGNOSIS — R9389 Abnormal findings on diagnostic imaging of other specified body structures: Secondary | ICD-10-CM

## 2011-11-03 DIAGNOSIS — R1013 Epigastric pain: Secondary | ICD-10-CM | POA: Insufficient documentation

## 2011-11-03 DIAGNOSIS — E785 Hyperlipidemia, unspecified: Secondary | ICD-10-CM

## 2011-11-03 LAB — BASIC METABOLIC PANEL
BUN: 13 mg/dL (ref 6–23)
CO2: 30 mEq/L (ref 19–32)
Chloride: 106 mEq/L (ref 96–112)
Creatinine, Ser: 0.8 mg/dL (ref 0.4–1.2)

## 2011-11-03 NOTE — Assessment & Plan Note (Signed)
Based on the last cholesterol panel , she was recommended to take Vytorin, she never started.Reason? Plan: Discontinue simvastatin Samples of Vytorin provided.

## 2011-11-03 NOTE — Assessment & Plan Note (Signed)
INR today 2.9, no change, 4 weeks

## 2011-11-03 NOTE — Progress Notes (Signed)
  Subjective:    Patient ID: Virginia Price, female    DOB: August 20, 1925, 78 y.o.   MRN: [id number]  HPI Here with several issues Needs an INR check Continue with generalized abdominal bloating, for a few months. Saw urology few weeks ago they suggested to talk to me and possibly order ultrasound. Also complained of wheezing when she gets active. Some cough. High cholesterol, never did try Vytorin. Still on  simvastatin Hypokalemia, took potassium temporarily,  Currently not taking supplements   Past Medical History: Atrial fibrillation (Dx 10-03 per Event monitor) Coronary artery disease CHF --Diastolic with fluid overload, normal systolic function,, July, 2012 Hyperlipidemia Hypertension Cerebrovascular accident, hx  (10-03 L MCA territory) Allergic rhinitis GERD, h/o esophageal stricture ? 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   Alcohol Use - no   Review of Systems No nausea, vomiting or diarrhea. No blood in the stools. Appetite decreased.No fever chills,  no weight loss    Objective:   Physical Exam  General -- alert, well-developed. No apparent distress.  Neck--no JVD at 45 Lungs -- normal respiratory effort, no intercostal retractions, no accessory muscle use, and few rhonchi, no wheezing  Heart-- irreg.   Abdomen--not distended, soft, no shifting dullness, mild diffuse tenderness. No organomegaly that I can tell.     Extremities-- no pretibial edema bilaterally  Psych-- Cognition and judgment appear intact. Alert and cooperative with normal attention span and concentration.  not anxious appearing and not depressed appearing.         Assessment & Plan:

## 2011-11-03 NOTE — Patient Instructions (Signed)
Continue with same Coumadin Discontinue simvastatin Start Vytorin once a day for cholesterol Come back in one month.

## 2011-11-03 NOTE — Assessment & Plan Note (Addendum)
Her chief complaint today is dyspepsia, chart reviewed: History of cholecystectomy, hysterectomy and appendectomy Cscope 10-04: incomplete but (-), reports she did have a f/u BE (Dr Russella Dar per pt)  Cscope again 11-2007 incomplete, then had a ACBE 02-2008: "Study is severely limited for detection of polyps"  As far as labs, she does not have anemia, LFTs have always been within normal. Had a CT of the abdomen 10- 2012, showed no ascites. Plan: Abdominal ultrasound If normal, GI referral---> further eval? IBS? Check a TSH, slow transit?

## 2011-11-03 NOTE — Assessment & Plan Note (Signed)
Recently found to have hypokalemia, took potassium temporarily. Check potassium

## 2011-11-03 NOTE — Assessment & Plan Note (Signed)
Complaining of wheezes sometimes also with activities, doesn't seem to be volume overload at this point. She's a nonsmoker however a chest x-ray last year showed changes of emphysema. Plan: Observation for now. Reassess on return to the office.

## 2011-11-03 NOTE — Assessment & Plan Note (Signed)
Last CT 2010, lesions were improving. Chest x-ray 2012 show COPD.

## 2011-11-04 ENCOUNTER — Encounter: Payer: Self-pay | Admitting: Internal Medicine

## 2011-11-08 ENCOUNTER — Encounter: Payer: Self-pay | Admitting: *Deleted

## 2011-11-09 ENCOUNTER — Ambulatory Visit
Admission: RE | Admit: 2011-11-09 | Discharge: 2011-11-09 | Disposition: A | Discharge status: Home / Self Care | Payer: Medicare Other | Source: Ambulatory Visit | Attending: Internal Medicine | Admitting: Internal Medicine

## 2011-11-09 DIAGNOSIS — R1013 Epigastric pain: Secondary | ICD-10-CM

## 2011-11-13 ENCOUNTER — Telehealth: Payer: Self-pay | Admitting: Internal Medicine

## 2011-11-23 ENCOUNTER — Ambulatory Visit (INDEPENDENT_AMBULATORY_CARE_PROVIDER_SITE_OTHER): Payer: Medicare Other | Admitting: Gastroenterology

## 2011-11-23 ENCOUNTER — Encounter: Payer: Self-pay | Admitting: Gastroenterology

## 2011-11-23 VITALS — BP 112/60 | HR 72 | Ht 67.0 in | Wt 164.2 lb

## 2011-11-23 DIAGNOSIS — R1084 Generalized abdominal pain: Secondary | ICD-10-CM

## 2011-11-23 DIAGNOSIS — R142 Eructation: Secondary | ICD-10-CM

## 2011-11-23 DIAGNOSIS — R140 Abdominal distension (gaseous): Secondary | ICD-10-CM

## 2011-11-23 NOTE — Patient Instructions (Addendum)
  You have been scheduled for a CT scan of the abdomen and pelvis at [facility] ([address]---this is in the same building as [facility]).   You are scheduled on 11/24/11 at 2:30pm. You should arrive 15 minutes prior to your appointment time for registration. Please follow the written instructions below on the day of your exam:  WARNING: IF YOU ARE ALLERGIC TO IODINE/X-RAY DYE, PLEASE NOTIFY RADIOLOGY IMMEDIATELY AT [phone]! YOU WILL BE GIVEN A 13 HOUR PREMEDICATION PREP.  1) Do not eat or drink anything after 10:30am (4 hours prior to your test) 2) You have been given 2 bottles of oral contrast to drink. The solution may taste better if refrigerated, but do NOT add ice or any other liquid to this solution. Shake well before drinking.    Drink 1 bottle of contrast @ 12:30pm (2 hours prior to your exam)  Drink 1 bottle of contrast @ 1:30pm (1 hour prior to your exam)  You may take any medications as prescribed with a small amount of water except for the following: Metformin, Glucophage, Glucovance, Avandamet, Riomet, Fortamet, Actoplus Met, Janumet, Glumetza or Metaglip. The above medications must be held the day of the exam AND 48 hours after the exam.  The purpose of you drinking the oral contrast is to aid in the visualization of your intestinal tract. The contrast solution may cause some diarrhea. Before your exam is started, you will be given a small amount of fluid to drink. Depending on your individual set of symptoms, you may also receive an intravenous injection of x-ray contrast/dye. Plan on being at [facility] for 30 minutes or long, depending on the type of exam you are having performed.  If you have any questions regarding your exam or if you need to reschedule, you may call the CT department at [phone] between the hours of 8:00 am and 5:00 pm, Monday-Friday.  ________________________________________________________________________  cc: Willow Ora, MD

## 2011-11-23 NOTE — Progress Notes (Signed)
History of Present Illness: This is an 78 year old female here today with her daughter. She complains of a several month history of constant, generalized abdominal bloating and abdominal pain. She states she is having a bowel movement on a daily or every other day basis and she does not feel she is constipated. Her pain does not very with the time of the day, meals, bowel movements or any other function or activity. She has gained 10 pounds over the past few months. She denies gas, belching or flatulence. She has a very tortuous colon with a history of incomplete colonoscopies. She underwent a partial colonoscopy a subsequent barium enema in 2009. Abdominal ultrasound performed on June 18 was unremarkable. Denies weight loss, constipation, diarrhea, change in stool caliber, melena, hematochezia, nausea, vomiting, dysphagia, reflux symptoms, chest pain.  Current Medications, Allergies, Past Medical History, Past Surgical History, Family History and Social History were reviewed in [employer] record.  Physical Exam: General: Well developed , well nourished, no acute distress Head: Normocephalic and atraumatic Eyes:  sclerae anicteric, EOMI Ears: Normal auditory acuity Mouth: No deformity or lesions Lungs: Clear throughout to auscultation Heart: Regular rate and rhythm; no murmurs, rubs or bruits Abdomen: Soft, distended, mild diffuse tenderness to deep palpation without rebound or guarding. No masses, hepatosplenomegaly or hernias noted. Normal Bowel sounds Musculoskeletal: Symmetrical with no gross deformities  Pulses:  Normal pulses noted Extremities: No clubbing, cyanosis, edema or deformities noted Neurological: Alert oriented x 4, grossly nonfocal Psychological:  Alert and cooperative. Normal mood and affect  Assessment and Recommendations:  1. Generalized abdominal pain and bloating. She appears distended and mildly tender on exam today. Rule out symptoms related to  constipation with incomplete fecal evacuation, weight gain or another intra-abdominal process. Schedule CT scan of the abdomen and pelvis.

## 2011-11-24 ENCOUNTER — Ambulatory Visit (INDEPENDENT_AMBULATORY_CARE_PROVIDER_SITE_OTHER)
Admission: RE | Admit: 2011-11-24 | Discharge: 2011-11-24 | Disposition: A | Discharge status: Home / Self Care | Payer: Medicare Other | Source: Ambulatory Visit | Attending: Gastroenterology | Admitting: Gastroenterology

## 2011-11-24 DIAGNOSIS — R1084 Generalized abdominal pain: Secondary | ICD-10-CM

## 2011-11-24 MED ORDER — IOHEXOL 300 MG/ML  SOLN
100.0000 mL | Freq: Once | INTRAMUSCULAR | Status: AC | PRN
  Administered 2011-11-24: 100 mL via INTRAVENOUS

## 2011-11-27 ENCOUNTER — Telehealth: Payer: Self-pay

## 2011-11-27 MED ORDER — EZETIMIBE-SIMVASTATIN 10-40 MG PO TABS: 1.0000 | ORAL_TABLET | Freq: Every day | ORAL | Status: DC

## 2011-11-27 NOTE — Telephone Encounter (Signed)
Patient called requesting CT Results, patient will be leaving to go out of town today and would like results prior to leaving,  please advise

## 2011-11-27 NOTE — Telephone Encounter (Signed)
Left detailed msg on pt's vmail.  

## 2011-11-27 NOTE — Telephone Encounter (Signed)
Please re-direct the call to GI, they ordered the test

## 2011-12-04 ENCOUNTER — Telehealth: Payer: Self-pay | Admitting: Gastroenterology

## 2011-12-04 NOTE — Telephone Encounter (Signed)
I have reviewed the results of the CT with her daughter.  They will call back for any additional questions or concerns

## 2011-12-07 ENCOUNTER — Ambulatory Visit (INDEPENDENT_AMBULATORY_CARE_PROVIDER_SITE_OTHER): Payer: Medicare Other | Admitting: *Deleted

## 2011-12-07 ENCOUNTER — Encounter: Payer: Self-pay | Admitting: Cardiology

## 2011-12-07 VITALS — BP 120/78 | HR 88

## 2011-12-07 DIAGNOSIS — I4891 Unspecified atrial fibrillation: Secondary | ICD-10-CM

## 2011-12-07 NOTE — Patient Instructions (Signed)
Take 1 mg daily. Come back in 3 weeks per Dr. Alwyn Ren.

## 2012-01-08 ENCOUNTER — Ambulatory Visit: Payer: Medicare Other

## 2012-01-11 ENCOUNTER — Ambulatory Visit (INDEPENDENT_AMBULATORY_CARE_PROVIDER_SITE_OTHER): Payer: Medicare Other | Admitting: *Deleted

## 2012-01-11 VITALS — BP 110/76 | HR 76 | Wt 162.0 lb

## 2012-01-11 DIAGNOSIS — Z7901 Long term (current) use of anticoagulants: Secondary | ICD-10-CM

## 2012-01-11 DIAGNOSIS — I4891 Unspecified atrial fibrillation: Secondary | ICD-10-CM

## 2012-01-11 NOTE — Patient Instructions (Signed)
Return to office in 1 week   New dosing: 1 tab daily ( 2 mg)

## 2012-01-16 ENCOUNTER — Ambulatory Visit (INDEPENDENT_AMBULATORY_CARE_PROVIDER_SITE_OTHER): Payer: Medicare Other | Admitting: *Deleted

## 2012-01-16 VITALS — BP 118/72 | HR 93 | Wt 162.0 lb

## 2012-01-16 DIAGNOSIS — I4891 Unspecified atrial fibrillation: Secondary | ICD-10-CM

## 2012-01-21 ENCOUNTER — Telehealth: Payer: Self-pay | Admitting: Internal Medicine

## 2012-01-21 NOTE — Telephone Encounter (Signed)
Needs INR checked at some point this week. Please arrange

## 2012-01-22 ENCOUNTER — Ambulatory Visit: Payer: Medicare Other

## 2012-01-22 NOTE — Telephone Encounter (Signed)
thx

## 2012-01-22 NOTE — Telephone Encounter (Signed)
Coming tomorrow at 930am

## 2012-01-23 ENCOUNTER — Ambulatory Visit (INDEPENDENT_AMBULATORY_CARE_PROVIDER_SITE_OTHER): Payer: Medicare Other | Admitting: *Deleted

## 2012-01-23 VITALS — BP 118/82 | HR 77 | Wt 165.0 lb

## 2012-01-23 DIAGNOSIS — I4891 Unspecified atrial fibrillation: Secondary | ICD-10-CM

## 2012-01-23 DIAGNOSIS — Z7901 Long term (current) use of anticoagulants: Secondary | ICD-10-CM

## 2012-01-23 NOTE — Patient Instructions (Signed)
Current Coumadin dose to milligrams daily. INR has increased from 1.6 to 3.0. Plan:  No change Come back in 10 days for another INR JP

## 2012-02-02 ENCOUNTER — Ambulatory Visit (INDEPENDENT_AMBULATORY_CARE_PROVIDER_SITE_OTHER): Payer: Medicare Other | Admitting: *Deleted

## 2012-02-02 DIAGNOSIS — I4891 Unspecified atrial fibrillation: Secondary | ICD-10-CM

## 2012-02-02 DIAGNOSIS — Z7901 Long term (current) use of anticoagulants: Secondary | ICD-10-CM

## 2012-02-02 NOTE — Patient Instructions (Addendum)
The patient has Coumadin 2 mg, reports she takes one tablet every day. INR has fluctuated lately. I'm not sure why, I wonder about compliance. Plan: Coumadin 2 mg one every day, come back in 10 days for a office visit. We are asking her to bring all her bottles with her. JP   Pt verbally informed over phone new coumadin instruction, appointment schedule and copy of AVS mailed to Pt home as well.Marti Sleigh, CMA

## 2012-02-04 ENCOUNTER — Encounter: Payer: Self-pay | Admitting: Cardiology

## 2012-02-04 DIAGNOSIS — Z7901 Long term (current) use of anticoagulants: Secondary | ICD-10-CM | POA: Insufficient documentation

## 2012-02-06 ENCOUNTER — Ambulatory Visit (INDEPENDENT_AMBULATORY_CARE_PROVIDER_SITE_OTHER): Payer: Medicare Other | Admitting: Cardiology

## 2012-02-06 ENCOUNTER — Encounter: Payer: Self-pay | Admitting: Cardiology

## 2012-02-06 VITALS — BP 120/72 | HR 86 | Resp 18 | Ht 67.0 in | Wt 165.8 lb

## 2012-02-06 DIAGNOSIS — I340 Nonrheumatic mitral (valve) insufficiency: Secondary | ICD-10-CM

## 2012-02-06 DIAGNOSIS — I509 Heart failure, unspecified: Secondary | ICD-10-CM

## 2012-02-06 DIAGNOSIS — I4891 Unspecified atrial fibrillation: Secondary | ICD-10-CM

## 2012-02-06 DIAGNOSIS — I059 Rheumatic mitral valve disease, unspecified: Secondary | ICD-10-CM

## 2012-02-06 DIAGNOSIS — R9389 Abnormal findings on diagnostic imaging of other specified body structures: Secondary | ICD-10-CM

## 2012-02-06 DIAGNOSIS — I10 Essential (primary) hypertension: Secondary | ICD-10-CM

## 2012-02-06 DIAGNOSIS — Z7901 Long term (current) use of anticoagulants: Secondary | ICD-10-CM

## 2012-02-06 MED ORDER — FUROSEMIDE 40 MG PO TABS: 40.0000 mg | ORAL_TABLET | Freq: Two times a day (BID) | ORAL | Status: DC

## 2012-02-06 NOTE — Assessment & Plan Note (Signed)
The patient has had diastolic heart failure in the past. I believe that there is a component of this at this time. I had increased her Lasix from 20-40 mg a day in April, 2013. She was taking it as 20 twice a day. Today we will increase the dose to 40 by mouth twice a day. We will call her at home. She does weigh herself daily. Her home weight recently has been 162. I'm hoping that we can diuresis her in the range of 5 pounds. Chemistry will be checked today.

## 2012-02-06 NOTE — Patient Instructions (Addendum)
**Note De-Identified  Obfuscation** Your physician has recommended you make the following change in your medication: INCREASE your Lasix to 40mg  twice daily  Your physician recommends that you return for lab work in: today (bmet)  A chest x-ray takes a picture of the organs and structures inside the chest, including the heart, lungs, and blood vessels. This test can show several things, including, whether the heart is enlarges; whether fluid is building up in the lungs; and whether pacemaker / defibrillator leads are still in place.  I will be calling you daily this week for the next 2 days

## 2012-02-06 NOTE — Assessment & Plan Note (Signed)
Systolic blood pressure is mildly elevated. I'm hopeful that this will improve with diuresis.

## 2012-02-06 NOTE — Assessment & Plan Note (Signed)
It is my understanding that over time her chestCT is actually improved. Her last chest x-ray was in 2012. She does have emphysema. Chest x-ray Will repeat be repeated to be sure that there is no marked change.

## 2012-02-06 NOTE — Progress Notes (Signed)
HPI  Patient is seen for followup of shortness of breath and atrial fibrillation. I saw her last April, 2013. At that time I thought that she did have some volume overload. I increased her Lasix from 20-40. She has not felt particularly well. She has not had significant change in her weight. She has exertional shortness of breath. She is not having PND or orthopnea. She does follow her weight at home. She and her family say that over the past several months it is up 10 pounds. Weight here today is up 1 pound since April.  Allergies  Allergen Reactions  . Ciprofloxacin Hives  . [name] (Ethacrynic Acid)   . Hydrocodone-Acetaminophen     REACTION: nausea  . Metronidazole   . Penicillins   . Prednisone     REACTION: ? reaction  . Sulfamethoxazole W-Trimethoprim   . Sulfonamide Derivatives   . Tramadol Hcl     Current Outpatient Prescriptions  Medication Sig Dispense Refill  . aspirin 81 MG tablet Take 81 mg by mouth daily.        Marland Kitchen buPROPion (WELLBUTRIN) 100 MG tablet 1 tab daily and 1 and a half tab on Tuesday and friday      . calcium-vitamin D (OSCAL) 250-125 MG-UNIT per tablet Take 1 tablet by mouth daily.        Marland Kitchen ezetimibe-simvastatin (VYTORIN) 10-40 MG per tablet Take 1 tablet by mouth at bedtime.  30 tablet  6  . furosemide (LASIX) 20 MG tablet Take 2 tablets (40 mg total) by mouth daily.  180 tablet  3  . loratadine (CLARITIN) 10 MG tablet Take 10 mg by mouth daily as needed. Allergies       . meclizine (ANTIVERT) 12.5 MG tablet Take 1 tablet (12.5 mg total) by mouth 2 (two) times daily.  180 tablet  3  . metoprolol (LOPRESSOR) 50 MG tablet Take 0.5 tablets (25 mg total) by mouth 2 (two) times daily.  60 tablet  6  . multivitamin (THERAGRAN) per tablet Take 1 tablet by mouth daily.        . nitroGLYCERIN (NITROLINGUAL) 0.4 MG/SPRAY spray Place 1 spray under the tongue every 5 (five) minutes as needed. Chest pain      . warfarin (COUMADIN) 2 MG tablet Take by mouth. As  directed      . warfarin (COUMADIN) 2 MG tablet       . DISCONTD: potassium chloride (K-DUR) 10 MEQ tablet Take 1 tablet (10 mEq total) by mouth 2 (two) times daily.  30 tablet  1    History   Social History  . Marital Status: Widowed    Spouse Name: N/A    Number of Children: 3  . Years of Education: N/A   Occupational History  .     Social History Main Topics  . Smoking status: Never Smoker   . Smokeless tobacco: Never Used  . Alcohol Use: No  . Drug Use: Not on file  . Sexually Active: Not on file   Other Topics Concern  . Not on file   Social History Narrative   Lives by herself, 1 daughter in [place], 1 son in [place], 1 daughter in [place]-- still drives     Family History  Problem Relation Age of Onset  . Heart disease Mother   . Colon polyps Mother   . Diabetes Mother   . Heart disease Father   . Diabetes Father     Past Medical History  Diagnosis Date  .  Atrial fibrillation     Coumadin therapy,  Holter monitor, March, 2012, atrial fib heart rate ranges, no more bradycardia or tachycardia while on digoxin  . Chest pain     Catheterization normal 2004, /  nuclear 2006 no ischemia  . Hyperlipidemia   . Hypertension   . Stroke   . Allergic rhinitis   . GERD (gastroesophageal reflux disease)   . Diverticular disease   . Colon polyp   . Abnormal chest CT     "innumerable small nodules  . Abnormal head CT 06/16/2010    atroph y  . Cataract     bilateral  . Drug therapy     Coumadin... atrial fibrillation  . Malignant melanoma   . Memory loss   . Restless leg syndrome   . CVA (cerebral infarction)     October, 2003, left middle cerebral artery territory  . CHF (congestive heart failure)     Diastolic with fluid overload, normal systolic function,, July, 2012.  Atrial fibrillation  . Ejection fraction <     EF 55-60% echo, January, 2012  . Mitral regurgitation     Mild to moderate, echo, January, 2012  . Erosive esophagitis   . Esophageal  stricture   . Diverticulosis     Past Surgical History  Procedure Date  . Cholecystectomy 1991  . Radical hysterectomy 1978  . Cataract extraction     bilateral  . Appendectomy   . Mohs surgery     03/2005    ROS   Patient denies fever, chills, headache, sweats, rash, change in vision, change in hearing, chest pain, nausea vomiting, urinary symptoms. All other systems are reviewed and are negative other than the history of present illness.  PHYSICAL EXAM Patient is able to lie relatively flat in the room without symptoms. She says she feels palpitations. Her rhythm is atrial fibrillation controlled rate. She is oriented to person time and place. Affect is normal. She's here with a younger family member. There are few scattered rales. There is no respiratory distress. Cardiac exam reveals S1 and S2. The rhythm is irregularly irregular compatible with atrial fibrillation. The abdomen is soft. There is trace peripheral edema. The family feels that the abdomen is protuberant for her however.  Filed Vitals:   02/06/12 1532  BP: 120/72  Pulse: 86  Resp: 18  Height: 5\' 7"  (1.702 m)  Weight: 165 lb 12.8 oz (75.206 kg)  SpO2: 96%   EKG is done today and reviewed by me. She does have atrial fibrillation. This is unchanged. The rate is controlled.  ASSESSMENT & PLAN

## 2012-02-06 NOTE — Assessment & Plan Note (Signed)
Patient continues on Coumadin. 

## 2012-02-06 NOTE — Assessment & Plan Note (Signed)
She does have mitral regurgitation is mild/moderate. Her last echo in January 2012 showed this.

## 2012-02-06 NOTE — Assessment & Plan Note (Signed)
Atrial fib rate is controlled. No change in therapy. 

## 2012-02-07 ENCOUNTER — Telehealth: Payer: Self-pay | Admitting: Cardiology

## 2012-02-07 ENCOUNTER — Ambulatory Visit (INDEPENDENT_AMBULATORY_CARE_PROVIDER_SITE_OTHER)
Admission: RE | Admit: 2012-02-07 | Discharge: 2012-02-07 | Disposition: A | Discharge status: Home / Self Care | Payer: Medicare Other | Source: Ambulatory Visit | Attending: Cardiology | Admitting: Cardiology

## 2012-02-07 ENCOUNTER — Other Ambulatory Visit (INDEPENDENT_AMBULATORY_CARE_PROVIDER_SITE_OTHER): Payer: Medicare Other

## 2012-02-07 DIAGNOSIS — I509 Heart failure, unspecified: Secondary | ICD-10-CM

## 2012-02-07 DIAGNOSIS — I340 Nonrheumatic mitral (valve) insufficiency: Secondary | ICD-10-CM

## 2012-02-07 DIAGNOSIS — I4891 Unspecified atrial fibrillation: Secondary | ICD-10-CM

## 2012-02-07 DIAGNOSIS — I10 Essential (primary) hypertension: Secondary | ICD-10-CM

## 2012-02-07 DIAGNOSIS — R9389 Abnormal findings on diagnostic imaging of other specified body structures: Secondary | ICD-10-CM

## 2012-02-07 DIAGNOSIS — I059 Rheumatic mitral valve disease, unspecified: Secondary | ICD-10-CM

## 2012-02-07 DIAGNOSIS — Z7901 Long term (current) use of anticoagulants: Secondary | ICD-10-CM

## 2012-02-07 NOTE — Telephone Encounter (Signed)
Mrs Hughart states her weight today is 162# (down 2# from yesterday's 164#).  She denies edema and states her sob is unchanged from yesterday.

## 2012-02-07 NOTE — Telephone Encounter (Signed)
Pt was called regarding her daily weights after increasing her dose of lasix to 40mg  bid.  She states she weighed 162# today which is 2# lower than yesterday (164#).  She states her sob is unchanged and she denies edema.

## 2012-02-07 NOTE — Addendum Note (Signed)
Addended by: Worthy Rancher D on: 02/07/2012 10:11 AM   Modules accepted: Orders

## 2012-02-07 NOTE — Telephone Encounter (Signed)
pt rtn call to debby, pls call [phone]

## 2012-02-08 LAB — BASIC METABOLIC PANEL
BUN: 19 mg/dL (ref 6–23)
CO2: 26 mEq/L (ref 19–32)
Glucose, Bld: 90 mg/dL (ref 70–99)
Potassium: 4.4 mEq/L (ref 3.5–5.1)
Sodium: 139 mEq/L (ref 135–145)

## 2012-02-08 NOTE — Telephone Encounter (Signed)
Virginia Price reports that her weight is 164# today.  No edema but she states she feels bloated today.

## 2012-02-12 NOTE — Telephone Encounter (Signed)
Wt=162#.  Pt states she has a sl amount of edema in her ankles.  Sob is unchanged.  She states she has a "knot" about the size of a quarter on her left leg between the ankle and knee.  She states it is a little better today.  She denies leg swelling or warmth to the touch.  Knot is red but leg is not.  Discussed with Dr Myrtis Ser who recommended that she see her pcp if it gets worse or does not improve.  She was notified of this.  She states she will be out of town until Friday.  I will call her back on Friday for weights.

## 2012-02-19 ENCOUNTER — Ambulatory Visit (INDEPENDENT_AMBULATORY_CARE_PROVIDER_SITE_OTHER): Payer: Medicare Other | Admitting: Internal Medicine

## 2012-02-19 VITALS — BP 112/68 | HR 85 | Temp 98.1°F | Wt 166.0 lb

## 2012-02-19 DIAGNOSIS — I4891 Unspecified atrial fibrillation: Secondary | ICD-10-CM

## 2012-02-19 DIAGNOSIS — K3189 Other diseases of stomach and duodenum: Secondary | ICD-10-CM

## 2012-02-19 DIAGNOSIS — R062 Wheezing: Secondary | ICD-10-CM

## 2012-02-19 DIAGNOSIS — I509 Heart failure, unspecified: Secondary | ICD-10-CM

## 2012-02-19 DIAGNOSIS — R1013 Epigastric pain: Secondary | ICD-10-CM

## 2012-02-19 DIAGNOSIS — Z23 Encounter for immunization: Secondary | ICD-10-CM

## 2012-02-19 DIAGNOSIS — E785 Hyperlipidemia, unspecified: Secondary | ICD-10-CM

## 2012-02-19 NOTE — Assessment & Plan Note (Signed)
Status post GI eval, they did a CT that showed only chronic findings, nothing to explain her symptoms. Recommend observation

## 2012-02-19 NOTE — Assessment & Plan Note (Addendum)
Recently seen by cardiology, recommended Lasix 40 mg twice a day, unable to tolerate such dose d/t  urination, currently taking 20 mg 4 times a day. Her weight is stable, she continue with mild dyspnea on exertion. Plan: Recommend to get as close as 40 milligrams twice a day as possible. For sure, take  40 mg every morning then 20 mg 2 additional times in the day.

## 2012-02-19 NOTE — Patient Instructions (Addendum)
Come back fasting at your convenience: FLP, AST, ALT hyperlipidemia ---- Lasix 40 mg twice a day (to at least 40 mg every morning and 20 mg 2 additional times) Keep in contact with your  heart doctor ----- Next office visit here 4 months --- Continue with same Coumadin dose, next check in 4 weeks

## 2012-02-19 NOTE — Telephone Encounter (Signed)
Virginia Price is back from vacation and states her weight today is: 162.5#.  She states she has a slight amount of edema and her sob is a little worse today.  She states she is still bloated (no change since appt).  She is to f/u in one month per Dr Myrtis Ser.

## 2012-02-19 NOTE — Assessment & Plan Note (Signed)
No wheezing on today's exam

## 2012-02-19 NOTE — Assessment & Plan Note (Signed)
Reportedly good compliance with Vytorin. Labs, see instructions

## 2012-02-19 NOTE — Progress Notes (Signed)
  Subjective:    Patient ID: Virginia Price, female    DOB: 06/22/1925, 78 y.o.   MRN: [id number]  HPI We discussed the following issues today: Medication compliance, reportedly taking all  medications as recommended except for Lasix. See below. Saw cardiology for shortness of breath,  they increase Lasix to 40 mg twice a day, unable to take high doses d/t to increase urination. Saw GI for dyspepsia, they ordered a CT which was unremarkable except for chronic findings. She continues with dyspepsia. Needs an INR check.  Past Medical History: Atrial fibrillation (Dx 10-03 per Event monitor) Coronary artery disease CHF --Diastolic with fluid overload, normal systolic function,, July, 2012 Hyperlipidemia Hypertension Cerebrovascular accident, hx  (10-03 L MCA territory) Allergic rhinitis GERD, h/o esophageal stricture ? fibromyalgia Moh's surgery 11-06 Allergic rhinitis Diverticulosis h/o Adenomatous colon polyps abnormal CT chest ("innumerable small nodules"), sees Dr Delton Coombes   Past Surgical History: Cholecystectomy-1991 Hysterectomy-oophorectomy 1978 Cataract extraction-bilateral Appendectomy Moh's surgery 11-06  Family History: Heart Disease: mother and father Diabetes: mother and father   Breast Ca--no   Colon Cancer-- no Colon Polyps:mother  Social History: lives by self, still drives, ADL independent Widow, 3 children neighbor very close to her [name]) Patient has never smoked.   Alcohol Use - no  Review of Systems No chest pain, occasional palpitations No  nausea, vomiting, diarrhea. Occasional wheezing.    Objective:   Physical Exam General -- alert, well-developed. No apparent distress.  Lungs -- normal respiratory effort, no intercostal retractions, no accessory muscle use, and few rhonchi, no wheezing  Heart-- irreg.  Extremities-- no pretibial edema bilaterally  Psych-- Cognition and judgment appear intact. Alert and cooperative with normal attention  span and concentration. not anxious appearing and not depressed appearing.     Assessment & Plan:

## 2012-02-19 NOTE — Assessment & Plan Note (Signed)
INR today 2.8, reports he takes Coumadin 2 mg every day.  Plan: No change, 4 weeks

## 2012-02-20 ENCOUNTER — Encounter: Payer: Self-pay | Admitting: Internal Medicine

## 2012-02-20 NOTE — Telephone Encounter (Signed)
Weight today=164#.  No edema, slight bloating and sob.  Unchanged.  Will call weekly per Dr Myrtis Ser.

## 2012-02-27 NOTE — Telephone Encounter (Signed)
**Note De-Identified  Obfuscation** Pt's weight is 167 lbs today. She c/o abdominal bloating and reports sob is unchanged from last week./LV

## 2012-02-29 NOTE — Telephone Encounter (Signed)
**Note De-Identified  Obfuscation** Pt. States her weight is 164 lbs. today. She states she has no edema and sob is unchanged./LV

## 2012-03-05 NOTE — Telephone Encounter (Signed)
**Note De-Identified  Obfuscation** Weight= 164 lbs. She states that her sob is unchanged and denies edema./LV

## 2012-03-08 NOTE — Telephone Encounter (Signed)
Weight today=164#.  No edema, no abd swelling and sob is unchanged.

## 2012-03-13 ENCOUNTER — Ambulatory Visit (INDEPENDENT_AMBULATORY_CARE_PROVIDER_SITE_OTHER): Payer: Medicare Other | Admitting: Cardiology

## 2012-03-13 ENCOUNTER — Encounter: Payer: Self-pay | Admitting: Cardiology

## 2012-03-13 VITALS — BP 124/78 | HR 85 | Ht 67.0 in | Wt 167.4 lb

## 2012-03-13 DIAGNOSIS — Z7901 Long term (current) use of anticoagulants: Secondary | ICD-10-CM

## 2012-03-13 DIAGNOSIS — R079 Chest pain, unspecified: Secondary | ICD-10-CM

## 2012-03-13 DIAGNOSIS — I509 Heart failure, unspecified: Secondary | ICD-10-CM

## 2012-03-13 DIAGNOSIS — I4891 Unspecified atrial fibrillation: Secondary | ICD-10-CM

## 2012-03-13 LAB — BASIC METABOLIC PANEL
BUN: 17 mg/dL (ref 6–23)
CO2: 27 mEq/L (ref 19–32)
Calcium: 8.9 mg/dL (ref 8.4–10.5)
Chloride: 106 mEq/L (ref 96–112)
Creatinine, Ser: 0.9 mg/dL (ref 0.4–1.2)

## 2012-03-13 NOTE — Assessment & Plan Note (Signed)
The patient has diastolic CHF. She is now under control. We'll continue the higher dose of diuretic. Chemistry will be checked today.

## 2012-03-13 NOTE — Assessment & Plan Note (Signed)
She's not having any recurrent chest pain. No change in therapy.

## 2012-03-13 NOTE — Progress Notes (Signed)
Patient ID: Virginia Price, female   DOB: 08-05-1925, 78 y.o.   MRN: [id number]   HPI  Patient is seen for followup of her shortness of breath. When I saw her last we increased her diuretic. She says she takes it twice a day on most days. Some days she doesn't take an extra dose because she feels that she is putting out too much urine. She has noted that she has decreased swelling in her feet.  Allergies  Allergen Reactions  . Ciprofloxacin Hives  . [name] (Ethacrynic Acid)   . Hydrocodone-Acetaminophen     REACTION: nausea  . Metronidazole   . Penicillins   . Prednisone     REACTION: ? reaction  . Sulfamethoxazole W-Trimethoprim   . Sulfonamide Derivatives   . Tramadol Hcl     Current Outpatient Prescriptions  Medication Sig Dispense Refill  . aspirin 81 MG tablet Take 81 mg by mouth daily.        Marland Kitchen buPROPion (WELLBUTRIN) 100 MG tablet 1 tab daily and 1 and a half tab on Tuesday and friday      . calcium-vitamin D (OSCAL) 250-125 MG-UNIT per tablet Take 1 tablet by mouth daily.        . cephALEXin (KEFLEX) 250 MG capsule Take 250 mg by mouth daily. Per urology for recurrent UTIs      . ezetimibe-simvastatin (VYTORIN) 10-40 MG per tablet Take 1 tablet by mouth at bedtime.  30 tablet  6  . furosemide (LASIX) 40 MG tablet Take 1 tablet (40 mg total) by mouth 2 (two) times daily.  60 tablet  6  . loratadine (CLARITIN) 10 MG tablet Take 10 mg by mouth daily as needed. Allergies       . meclizine (ANTIVERT) 12.5 MG tablet Take 1 tablet (12.5 mg total) by mouth 2 (two) times daily.  180 tablet  3  . metoprolol (LOPRESSOR) 50 MG tablet Take 0.5 tablets (25 mg total) by mouth 2 (two) times daily.  60 tablet  6  . multivitamin (THERAGRAN) per tablet Take 1 tablet by mouth daily.        . nitroGLYCERIN (NITROLINGUAL) 0.4 MG/SPRAY spray Place 1 spray under the tongue every 5 (five) minutes as needed. Chest pain      . warfarin (COUMADIN) 2 MG tablet Take by mouth. As directed      . warfarin  (COUMADIN) 2 MG tablet       . DISCONTD: potassium chloride (K-DUR) 10 MEQ tablet Take 1 tablet (10 mEq total) by mouth 2 (two) times daily.  30 tablet  1    History   Social History  . Marital Status: Widowed    Spouse Name: N/A    Number of Children: 3  . Years of Education: N/A   Occupational History  .     Social History Main Topics  . Smoking status: Never Smoker   . Smokeless tobacco: Never Used  . Alcohol Use: No  . Drug Use: Not on file  . Sexually Active: Not on file   Other Topics Concern  . Not on file   Social History Narrative   Lives by herself, 1 daughter in [place], 1 son in [place], 1 daughter in [place]-- still drives     Family History  Problem Relation Age of Onset  . Heart disease Mother   . Colon polyps Mother   . Diabetes Mother   . Heart disease Father   . Diabetes Father     Past  Medical History  Diagnosis Date  . Atrial fibrillation     Coumadin therapy,  Holter monitor, March, 2012, atrial fib heart rate ranges, no more bradycardia or tachycardia while on digoxin  . Chest pain     Catheterization normal 2004, /  nuclear 2006 no ischemia  . Hyperlipidemia   . Hypertension   . Stroke   . Allergic rhinitis   . GERD (gastroesophageal reflux disease)   . Diverticular disease   . Colon polyp   . Abnormal chest CT     "innumerable small nodules  . Abnormal head CT 06/16/2010    atroph y  . Cataract     bilateral  . Drug therapy     Coumadin... atrial fibrillation  . Malignant melanoma   . Memory loss   . Restless leg syndrome   . CVA (cerebral infarction)     October, 2003, left middle cerebral artery territory  . CHF (congestive heart failure)     Diastolic with fluid overload, normal systolic function,, July, 2012.  Atrial fibrillation  . Ejection fraction <     EF 55-60% echo, January, 2012  . Mitral regurgitation     Mild to moderate, echo, January, 2012  . Erosive esophagitis   . Esophageal stricture   . Diverticulosis       Past Surgical History  Procedure Date  . Cholecystectomy 1991  . Radical hysterectomy 1978  . Cataract extraction     bilateral  . Appendectomy   . Mohs surgery     03/2005    Patient Active Problem List  Diagnosis  . MALIGNANT MELANOMA, SKIN  . HYPERLIPIDEMIA  . RESTLESS LEG SYNDROME  . HYPERTENSION  . ATRIAL FIBRILLATION  . ALLERGIC RHINITIS  . GERD  . IBS  . OVERACTIVE BLADDER  . BACK PAIN  . MEMORY LOSS  . CARCINOMA, BASAL CELL, HX OF  . PERSONAL HX COLONIC POLYPS  . DIZZINESS  . Chest pain  . GERD (gastroesophageal reflux disease)  . Abnormal chest CT  . CHF (congestive heart failure)  . Atrial fibrillation  . CVA (cerebral infarction)  . Mitral regurgitation  . Recurrent UTI  . Failure to thrive  . Ejection fraction <  . General medical examination  . Dyspepsia  . Wheezing  . Warfarin anticoagulation    ROS   Patient denies fever, chills, headache, sweats, rash, change in vision, change in hearing, chest pain, cough, nausea vomiting, urinary symptoms. All other systems are reviewed and are negative.  PHYSICAL EXAM  Patient is oriented to person time and place. Affect is normal. There is no jugulovenous distention. Lungs are clear. Respiratory effort is nonlabored. Cardiac exam reveals S1 and S2. The rhythm is irregularly irregular. The abdomen is soft. There is no peripheral edema.  Filed Vitals:   03/13/12 1051  BP: 124/78  Pulse: 85  Height: 5\' 7"  (1.702 m)  Weight: 167 lb 6.4 oz (75.932 kg)  SpO2: 98%    EKG  ASSESSMENT & PLAN

## 2012-03-13 NOTE — Patient Instructions (Signed)
Your physician recommends that you schedule a follow-up appointment in: 6 months.  Your physician recommends that you continue on your current medications as directed. Please refer to the Current Medication list given to you today.  Your physician recommends that you return for lab work in: BMET today  Your chest xray was normal

## 2012-03-13 NOTE — Assessment & Plan Note (Signed)
Coumadin is to be continued. No change in therapy. 

## 2012-03-13 NOTE — Assessment & Plan Note (Signed)
Atrial fibrillation controlled. No change in therapy. 

## 2012-03-20 NOTE — Addendum Note (Signed)
Addended by: Marrion Coy L on: 03/20/2012 11:50 AM   Modules accepted: Orders

## 2012-03-21 ENCOUNTER — Other Ambulatory Visit (INDEPENDENT_AMBULATORY_CARE_PROVIDER_SITE_OTHER): Payer: Medicare Other

## 2012-03-21 ENCOUNTER — Ambulatory Visit (INDEPENDENT_AMBULATORY_CARE_PROVIDER_SITE_OTHER): Payer: Medicare Other

## 2012-03-21 VITALS — BP 122/80 | HR 96 | Wt 167.0 lb

## 2012-03-21 DIAGNOSIS — E7800 Pure hypercholesterolemia, unspecified: Secondary | ICD-10-CM

## 2012-03-21 DIAGNOSIS — I4891 Unspecified atrial fibrillation: Secondary | ICD-10-CM

## 2012-03-21 LAB — LIPID PANEL
Cholesterol: 168 mg/dL (ref 0–200)
LDL Cholesterol: 84 mg/dL (ref 0–99)
Total CHOL/HDL Ratio: 4
Triglycerides: 180 mg/dL — ABNORMAL HIGH (ref 0.0–149.0)
VLDL: 36 mg/dL (ref 0.0–40.0)

## 2012-03-21 LAB — POCT INR: INR: 2

## 2012-03-21 NOTE — Patient Instructions (Addendum)
Your INR was 2.0 today which was Good. Continue to take 2 mg of coumadin daily and we will recheck your INR level in 4 weeks or sooner if needed.        KP

## 2012-03-26 ENCOUNTER — Encounter: Payer: Self-pay | Admitting: *Deleted

## 2012-04-26 ENCOUNTER — Encounter: Payer: Self-pay | Admitting: *Deleted

## 2012-04-30 ENCOUNTER — Telehealth: Payer: Self-pay | Admitting: Internal Medicine

## 2012-04-30 NOTE — Telephone Encounter (Signed)
Overdue for inr, please arrange

## 2012-05-01 ENCOUNTER — Ambulatory Visit (INDEPENDENT_AMBULATORY_CARE_PROVIDER_SITE_OTHER): Payer: Medicare Other | Admitting: Gastroenterology

## 2012-05-01 ENCOUNTER — Encounter: Payer: Self-pay | Admitting: Gastroenterology

## 2012-05-01 ENCOUNTER — Ambulatory Visit (INDEPENDENT_AMBULATORY_CARE_PROVIDER_SITE_OTHER): Payer: Medicare Other | Admitting: *Deleted

## 2012-05-01 VITALS — BP 118/74 | HR 80 | Ht 65.5 in | Wt 168.0 lb

## 2012-05-01 VITALS — BP 122/68 | HR 82

## 2012-05-01 DIAGNOSIS — R140 Abdominal distension (gaseous): Secondary | ICD-10-CM

## 2012-05-01 DIAGNOSIS — K219 Gastro-esophageal reflux disease without esophagitis: Secondary | ICD-10-CM

## 2012-05-01 DIAGNOSIS — Z7901 Long term (current) use of anticoagulants: Secondary | ICD-10-CM

## 2012-05-01 DIAGNOSIS — R141 Gas pain: Secondary | ICD-10-CM

## 2012-05-01 DIAGNOSIS — K5900 Constipation, unspecified: Secondary | ICD-10-CM

## 2012-05-01 DIAGNOSIS — R1310 Dysphagia, unspecified: Secondary | ICD-10-CM

## 2012-05-01 MED ORDER — OMEPRAZOLE 40 MG PO CPDR: 40.0000 mg | DELAYED_RELEASE_CAPSULE | Freq: Every day | ORAL | Status: DC

## 2012-05-01 NOTE — Patient Instructions (Signed)
No change. Continue taking 1 tablet (2mg ) daily. Come back in 4 weeks.

## 2012-05-01 NOTE — Progress Notes (Signed)
History of Present Illness: This is an 78 year old female with worsening but chronic complaints of abdominal bloating. She is accompanied by her neighbor. She has gained weight over the past year or 2. She has ongoing problems with constipation and irritable bowel syndrome. CT scan of the abdomen/pelvis in July was unremarkable. She previously had incomplete colonoscopy and barium enema in 2009 which did not show any significant pathology. She notes occasional difficulty swallowing liquids and solids as well as record heartburn and reflux symptoms for the past several months. She is no longer taking a PPI. She states she takes Rolaids as needed. Denies weight loss, diarrhea, change in stool caliber, melena, hematochezia, nausea, vomiting, chest pain.  Current Medications, Allergies, Past Medical History, Past Surgical History, Family History and Social History were reviewed in [employer] record.  Physical Exam: General: Well developed , well nourished, elderly, no acute distress Head: Normocephalic and atraumatic Eyes:  sclerae anicteric, EOMI Ears: Normal auditory acuity Mouth: No deformity or lesions Lungs: Clear throughout to auscultation Heart: Regular rate and rhythm; no murmurs, rubs or bruits Abdomen: Soft, non tender and mildly distended. No masses, hepatosplenomegaly or hernias noted. Normal Bowel sounds Musculoskeletal: Symmetrical with no gross deformities  Pulses:  Normal pulses noted Extremities: No clubbing, cyanosis, edema or deformities noted Neurological: Alert oriented x 4, grossly non focal, except for memory deficits Psychological:  Alert and cooperative. Normal mood and affect  Assessment and Recommendations:  1. Abdominal bloating is most likely related to IBS, constipation and weight gain. Given her history of incomplete colonoscopies and comorbidities I recommended she undergo an air-contrast barium enema for further evaluation. She had difficulty  tolerating this before and she is not sure she wants to proceed. She would like to discuss this with her daughter. Begin MiraLax once or twice daily titrated for adequate bowel movements. Consider a weight loss diet. If her symptoms improve on a laxative regimen and with weight loss she may not need to undergo barium enema.  2. GERD and dysphagia. Standard antireflux measures and begin omeprazole 40 mg daily for long-term usage. If her symptoms are not well controlled within 4-6 weeks consider proceeding with a barium esophagram and possibly upper endoscopy.

## 2012-05-01 NOTE — Patient Instructions (Addendum)
Start over the counter Miralax mixing 17 grams in 8 oz of water 1-2 x daily long term for constipation and bloating.   We have sent the following medications to your pharmacy for you to pick up at your convenience: Omeprazole.  Patient advised to avoid spicy, acidic, citrus, chocolate, mints, fruit and fruit juices.  Limit the intake of caffeine, alcohol and Soda.  Don't exercise too soon after eating.  Don't lie down within 3-4 hours of eating.  Elevate the head of your bed.  Call us back if your symptoms persist to schedule an air contrasted Barium Enema.  Your bloating is related to your weight gain and IBS issues.

## 2012-05-02 NOTE — Telephone Encounter (Signed)
Pt had inr check on 05/01/12.

## 2012-05-03 ENCOUNTER — Telehealth: Payer: Self-pay | Admitting: Gastroenterology

## 2012-05-03 NOTE — Telephone Encounter (Signed)
Pt's friend Rebbeca Paul brought her to her visit 05-01-12. She was told to call Dr. Ardell Isaacs nurse and let her know the names of the medications given. She went through the pt's meds, and says the medications given by her doctor were for over active bladder not for her BM. The medications are Vesicare 10 MG she took this one for two weeks, and Myrbetriq 25 MG. She says they will be out together and to call [name] on her cell [phone].

## 2012-05-03 NOTE — Telephone Encounter (Signed)
Patient was under the impression that the meds prescribed by Dr. Vernie Ammons were laxatives.  Her neighbor has helped her go back through her meds and dosages and what they are for.  She has started on Miralax and doing better.

## 2012-05-24 ENCOUNTER — Encounter: Payer: Self-pay | Admitting: Internal Medicine

## 2012-05-31 ENCOUNTER — Other Ambulatory Visit: Payer: Self-pay | Admitting: Internal Medicine

## 2012-05-31 ENCOUNTER — Ambulatory Visit (INDEPENDENT_AMBULATORY_CARE_PROVIDER_SITE_OTHER): Payer: Medicare Other | Admitting: *Deleted

## 2012-05-31 VITALS — BP 100/70 | HR 95 | Wt 170.0 lb

## 2012-05-31 DIAGNOSIS — Z7901 Long term (current) use of anticoagulants: Secondary | ICD-10-CM

## 2012-05-31 DIAGNOSIS — I4891 Unspecified atrial fibrillation: Secondary | ICD-10-CM

## 2012-05-31 NOTE — Telephone Encounter (Signed)
Refill done.  

## 2012-05-31 NOTE — Patient Instructions (Signed)
Return to office in 4 weeks  Continue current dose: 1 tab daily (2 mg)

## 2012-07-19 ENCOUNTER — Telehealth: Payer: Self-pay | Admitting: Internal Medicine

## 2012-07-19 NOTE — Telephone Encounter (Signed)
Due for an INR, please arrange 

## 2012-07-19 NOTE — Telephone Encounter (Signed)
Discussed with pt, scheduled appt.

## 2012-07-24 ENCOUNTER — Ambulatory Visit (INDEPENDENT_AMBULATORY_CARE_PROVIDER_SITE_OTHER): Payer: Medicare Other | Admitting: *Deleted

## 2012-07-24 VITALS — BP 102/76 | HR 100 | Wt 167.0 lb

## 2012-07-24 DIAGNOSIS — Z7901 Long term (current) use of anticoagulants: Secondary | ICD-10-CM

## 2012-07-24 DIAGNOSIS — I4891 Unspecified atrial fibrillation: Secondary | ICD-10-CM

## 2012-07-24 LAB — POCT INR: INR: 2.7

## 2012-07-24 NOTE — Patient Instructions (Signed)
Return to office in 4 weeks  Continue current dose: Take 1 tab daily

## 2012-08-17 ENCOUNTER — Other Ambulatory Visit: Payer: Self-pay | Admitting: Internal Medicine

## 2012-08-19 NOTE — Telephone Encounter (Signed)
Refill done.  

## 2012-08-28 ENCOUNTER — Ambulatory Visit (INDEPENDENT_AMBULATORY_CARE_PROVIDER_SITE_OTHER): Payer: Medicare Other | Admitting: *Deleted

## 2012-08-28 ENCOUNTER — Telehealth: Payer: Self-pay | Admitting: Internal Medicine

## 2012-08-28 VITALS — BP 130/80 | HR 83

## 2012-08-28 DIAGNOSIS — I4891 Unspecified atrial fibrillation: Secondary | ICD-10-CM

## 2012-08-28 DIAGNOSIS — Z7901 Long term (current) use of anticoagulants: Secondary | ICD-10-CM

## 2012-08-28 LAB — POCT INR: INR: 2.1

## 2012-08-28 NOTE — Telephone Encounter (Signed)
Pt made aware rx is ready to be picked up at front desk.  

## 2012-08-28 NOTE — Telephone Encounter (Signed)
VYTORIN samples are needed. Patient would like to come today to pick up.

## 2012-08-28 NOTE — Patient Instructions (Signed)
Continue taking 2mg  daily. Return to the office in 4 weeks.

## 2012-09-25 ENCOUNTER — Ambulatory Visit (INDEPENDENT_AMBULATORY_CARE_PROVIDER_SITE_OTHER): Payer: Medicare Other | Admitting: Internal Medicine

## 2012-09-25 ENCOUNTER — Encounter: Payer: Self-pay | Admitting: Internal Medicine

## 2012-09-25 VITALS — BP 122/84 | HR 97 | Temp 98.3°F | Wt 170.0 lb

## 2012-09-25 DIAGNOSIS — R079 Chest pain, unspecified: Secondary | ICD-10-CM

## 2012-09-25 DIAGNOSIS — R062 Wheezing: Secondary | ICD-10-CM

## 2012-09-25 DIAGNOSIS — I4891 Unspecified atrial fibrillation: Secondary | ICD-10-CM

## 2012-09-25 DIAGNOSIS — Z7901 Long term (current) use of anticoagulants: Secondary | ICD-10-CM

## 2012-09-25 MED ORDER — ALBUTEROL SULFATE HFA 108 (90 BASE) MCG/ACT IN AERS: 2.0000 | INHALATION_SPRAY | Freq: Four times a day (QID) | RESPIRATORY_TRACT | Status: DC | PRN

## 2012-09-25 MED ORDER — FLUTICASONE PROPIONATE 50 MCG/ACT NA SUSP: 2.0000 | Freq: Every day | NASAL | Status: DC

## 2012-09-25 NOTE — Assessment & Plan Note (Addendum)
Reports on-off wheezing, cough, chest tightness;  exam showed no wheezing today. Her "tightness" is  atypical for angina. EKG -- afib, no acute changes On chart review, she had this sx before, previous cardiac w/u neg. despite poor lasix compliance does not seem vol overload. Plan: CXR Increase omeprazole 40 to bid albut prn Add flonase, allergies? ER if sx severe

## 2012-09-25 NOTE — Assessment & Plan Note (Signed)
inr 2.2, no change, 4 weeks

## 2012-09-25 NOTE — Progress Notes (Signed)
  Subjective:    Patient ID: Virginia Price, female    DOB: [birth year], 79 y.o.   MRN: [id number]  HPI Acute visit Complains of cough for about one month associated with wheezing and a feeling of tightness in the upper chest, the tightness is steady but worse with exertion; wheezing is severe but on-off.  No sputum production.  Past Medical History: Atrial fibrillation (Dx 10-03 per Event monitor) Coronary artery disease CHF --Diastolic with fluid overload, normal systolic function,, July, 2012 Hyperlipidemia Hypertension Cerebrovascular accident, hx  (10-03 L MCA territory) Allergic rhinitis GERD, h/o esophageal stricture ? fibromyalgia Moh's surgery 11-06 Allergic rhinitis Diverticulosis h/o Adenomatous colon polyps abnormal CT chest ("innumerable small nodules"), sees Dr Delton Coombes   Past Surgical History: Cholecystectomy-1991 Hysterectomy-oophorectomy 1978 Cataract extraction-bilateral Appendectomy Moh's surgery 11-06  Family History: Heart Disease: mother and father Diabetes: mother and father   Breast Ca--no   Colon Cancer-- no Colon Polyps:mother  Social History: lives by self, still drives, ADL independent Widow, 3 children neighbor very close to her [name]) Patient has never smoked.   Alcohol Use - no   Review of Systems Admits to runny nose , Itchy eyes, watery eyes. No sneezing. Reports "lots of swelling in the legs"  (No swelling noted today)  but no orthopnea. She was recommended to take Lasix approximately 40 mg twice a day, taking Only 40 mg daily and not every day. She has chronic dyspnea on exertion, about the same as before. No sputum production.     Objective:   Physical Exam BP 122/84  Pulse 97  Temp(Src) 98.3 F (36.8 C) (Oral)  Wt 170 lb (77.111 kg)  BMI 27.85 kg/m2  SpO2 97%  General -- alert, well-developed, No apparent distress.   Neck --no JVD at 45 HEENT -- TMs normal, throat w/o redness, face symmetric and  slt tender to  palpation bilaterally Lungs -- normal respiratory effort, no intercostal retractions, no accessory muscle use, and normal breath sounds.   Heart-- irreg     Extremities-- no pretibial edema bilaterally Neurologic-- alert & oriented X3 and strength normal in all extremities. Psych-- Cognition and judgment appear intact. Alert and cooperative with normal attention span and concentration.  not anxious appearing and not depressed appearing.       Assessment & Plan:

## 2012-09-25 NOTE — Patient Instructions (Addendum)
Please get your x-ray at the other [place]  office located at: [address] [place], across from [facility].  Please go to the basement, this is a walk-in facility, they are open from 8:30 to 5:30 PM. Phone number [phone]. ---  continue with same Coumadin dose, next check in one month. --- For the next 2 weeks, increase omeprazole 40 mg to one tablet twice a day, before breakfast and before supper. ---- Lasix 40 mg one tablet twice a day (or at least one tablet every morning.) --- Flonase as prescribed (new) Albuterol as needed for wheezing (new) Call or go to the ER if severe symptoms. Schedule a routine visit for next month.

## 2012-09-25 NOTE — Assessment & Plan Note (Signed)
See "wheezing"

## 2012-10-07 ENCOUNTER — Other Ambulatory Visit: Payer: Self-pay | Admitting: Internal Medicine

## 2012-10-08 NOTE — Telephone Encounter (Signed)
Refill done.  

## 2012-10-21 ENCOUNTER — Other Ambulatory Visit: Payer: Self-pay | Admitting: Internal Medicine

## 2012-10-22 NOTE — Telephone Encounter (Signed)
Refill done.  

## 2012-10-29 ENCOUNTER — Telehealth: Payer: Self-pay | Admitting: Internal Medicine

## 2012-10-29 NOTE — Telephone Encounter (Signed)
Advise patient, she is due for an INR check. Please arrange

## 2012-10-30 NOTE — Telephone Encounter (Signed)
Pt is scheduled for an INR check tomorrow @ 2pm.

## 2012-10-31 ENCOUNTER — Ambulatory Visit (INDEPENDENT_AMBULATORY_CARE_PROVIDER_SITE_OTHER): Payer: Medicare Other | Admitting: *Deleted

## 2012-10-31 VITALS — BP 122/76 | HR 96 | Wt 165.0 lb

## 2012-10-31 DIAGNOSIS — I4891 Unspecified atrial fibrillation: Secondary | ICD-10-CM

## 2012-10-31 DIAGNOSIS — Z7901 Long term (current) use of anticoagulants: Secondary | ICD-10-CM

## 2012-10-31 LAB — PROTIME-INR: INR: 1.7 ratio — ABNORMAL HIGH (ref 0.8–1.0)

## 2012-10-31 LAB — POCT INR: INR: 1.6

## 2012-10-31 NOTE — Patient Instructions (Addendum)
Currently on Coumadin 2 mg daily (14 mg/w), INR today 1.6.  INR has been very good in that dose for a while. Plan: Venous stick. Further advise results JP ----- Addendum INR venous 1.7 Plan: Coumadin 2 mg: 2 mg qd except Tuesday and Friday take 4 mg (18 mg/w) Next INR 2 weeks JP

## 2012-11-01 ENCOUNTER — Telehealth: Payer: Self-pay | Admitting: *Deleted

## 2012-11-01 NOTE — Telephone Encounter (Signed)
Left message to call office to advise Pt she is to take 2 mg qd except Tuesday and Friday take 4 mg (18 mg/w) Next INR 2 weeks

## 2012-11-01 NOTE — Telephone Encounter (Signed)
Left Pt detail VM on how to take med and to return a call to confirm that she did received instruction.

## 2012-11-04 ENCOUNTER — Encounter: Payer: Self-pay | Admitting: *Deleted

## 2012-11-04 NOTE — Telephone Encounter (Addendum)
Left message to call office to see if Pt received VM and Letter Mail to Pt as well with instruction.Marland Kitchen

## 2012-11-07 NOTE — Telephone Encounter (Signed)
Pt states that she has not gotten our messages or letter as of yet because she is in between 3 home right now. Pt was made aware of the changes and appt schedule for 11-18-12 for recheck of INR. Pt notes that she will be out of town next week so she will have to come the following week.

## 2012-11-08 NOTE — Telephone Encounter (Signed)
Noted  

## 2012-11-18 ENCOUNTER — Ambulatory Visit (INDEPENDENT_AMBULATORY_CARE_PROVIDER_SITE_OTHER): Payer: Medicare Other | Admitting: *Deleted

## 2012-11-18 ENCOUNTER — Ambulatory Visit: Payer: Medicare Other | Admitting: Internal Medicine

## 2012-11-18 ENCOUNTER — Telehealth: Payer: Self-pay | Admitting: *Deleted

## 2012-11-18 VITALS — BP 128/72 | Temp 97.6°F | Wt 159.0 lb

## 2012-11-18 DIAGNOSIS — I4891 Unspecified atrial fibrillation: Secondary | ICD-10-CM

## 2012-11-18 DIAGNOSIS — Z7901 Long term (current) use of anticoagulants: Secondary | ICD-10-CM

## 2012-11-18 NOTE — Telephone Encounter (Signed)
Pt in office for INR check, requesting samples of Vytorin10-40mg . Samples x 4 given Lot #[id number], Exp Date 10/2013.

## 2012-11-18 NOTE — Patient Instructions (Signed)
Take 4mg  on Mon, Wed and Fri and 2mg  on Tues, Thur, Sat and Sunday. Return in 2 weeks for INR recheck.

## 2012-11-18 NOTE — Progress Notes (Signed)
  Subjective:    Patient ID: Virginia Price, female    DOB: 1925/10/27, 79 y.o.   MRN: [id number]  HPI INR today 1.5 Current Coumadin 2 mg: 2 mg qd except Tuesday and Friday take 4 mg (18 mg/w)  New dose :  Coumadin 2 mg: 4 mg qd except Monday, Wednesday and Friday take 2 mg  (22 mg/w)  Next INR 3 weeks JP   Review of Systems     Objective:   Physical Exam        Assessment & Plan:

## 2012-11-19 ENCOUNTER — Other Ambulatory Visit: Payer: Self-pay | Admitting: Cardiology

## 2012-11-28 ENCOUNTER — Ambulatory Visit (INDEPENDENT_AMBULATORY_CARE_PROVIDER_SITE_OTHER): Payer: Medicare Other

## 2012-11-28 VITALS — BP 132/88 | HR 89 | Temp 98.1°F | Wt 168.0 lb

## 2012-11-28 DIAGNOSIS — I340 Nonrheumatic mitral (valve) insufficiency: Secondary | ICD-10-CM

## 2012-11-28 DIAGNOSIS — R9430 Abnormal result of cardiovascular function study, unspecified: Secondary | ICD-10-CM

## 2012-11-28 DIAGNOSIS — I059 Rheumatic mitral valve disease, unspecified: Secondary | ICD-10-CM

## 2012-11-28 DIAGNOSIS — R0989 Other specified symptoms and signs involving the circulatory and respiratory systems: Secondary | ICD-10-CM

## 2012-11-28 LAB — POCT INR: INR: 1.1

## 2012-11-28 NOTE — Patient Instructions (Addendum)
Patient should take two 2 4 mg tablets today (Wed). Take two 2mg  tablets MWF and one 2mg  tablet on Tues, Thurs, Sat, Sun Return in week for recheck.

## 2012-12-15 ENCOUNTER — Telehealth: Payer: Self-pay | Admitting: Internal Medicine

## 2012-12-15 NOTE — Telephone Encounter (Signed)
Needs a recheck INR, please arrange

## 2012-12-17 ENCOUNTER — Ambulatory Visit (INDEPENDENT_AMBULATORY_CARE_PROVIDER_SITE_OTHER): Payer: Medicare Other

## 2012-12-17 VITALS — BP 130/90 | HR 105 | Temp 98.5°F | Wt 165.4 lb

## 2012-12-17 DIAGNOSIS — R0989 Other specified symptoms and signs involving the circulatory and respiratory systems: Secondary | ICD-10-CM

## 2012-12-17 DIAGNOSIS — I340 Nonrheumatic mitral (valve) insufficiency: Secondary | ICD-10-CM

## 2012-12-17 DIAGNOSIS — R9430 Abnormal result of cardiovascular function study, unspecified: Secondary | ICD-10-CM

## 2012-12-17 DIAGNOSIS — I4891 Unspecified atrial fibrillation: Secondary | ICD-10-CM

## 2012-12-17 DIAGNOSIS — I059 Rheumatic mitral valve disease, unspecified: Secondary | ICD-10-CM

## 2012-12-17 LAB — PROTIME-INR: INR: 2.7 — AB (ref 0.9–1.1)

## 2012-12-17 LAB — POCT INR: INR: 2.7

## 2012-12-17 NOTE — Telephone Encounter (Signed)
Unable to reach patient. Called designated party to see if she could have her contact the office. GF/RN

## 2012-12-17 NOTE — Telephone Encounter (Signed)
Patient called and will be RTC 12/17/2012 to recheck. GF/RN

## 2012-12-17 NOTE — Patient Instructions (Addendum)
Current INR 2.7. Continue current dosing, recheck in one month. Dr. Drue Novel will review for any changes. GF/RN

## 2012-12-19 NOTE — Telephone Encounter (Signed)
Patient was seen in office 12/17/2012. GF/RN

## 2012-12-30 ENCOUNTER — Ambulatory Visit: Payer: Medicare Other

## 2013-01-17 ENCOUNTER — Telehealth: Payer: Self-pay | Admitting: Internal Medicine

## 2013-01-17 NOTE — Telephone Encounter (Signed)
Called pt for Dr. Drue Novel, Pt stated she takes 2mg  daily.  Refill done per order.  INR check for 01/20/13

## 2013-01-17 NOTE — Telephone Encounter (Signed)
Dr.Paz, Please clarify sig for current coumadin dosing. Last coag visit 7/22 there are two different dosing instrucitons. Spoke with patient, she is completely out of coumadin. Will gladly change sig to match recommendations. Scheduled for INR check 8/25.

## 2013-01-17 NOTE — Telephone Encounter (Signed)
Will call the patient, she is taking Coumadin 2 mg one tablet daily. Plan: Will check her INR on Monday. Okay to send a refill for #30 tablets

## 2013-01-20 ENCOUNTER — Ambulatory Visit (INDEPENDENT_AMBULATORY_CARE_PROVIDER_SITE_OTHER): Payer: Medicare Other | Admitting: *Deleted

## 2013-01-20 ENCOUNTER — Ambulatory Visit: Payer: Medicare Other

## 2013-01-20 VITALS — BP 100/70 | HR 81 | Temp 97.7°F | Wt 166.4 lb

## 2013-01-20 DIAGNOSIS — I059 Rheumatic mitral valve disease, unspecified: Secondary | ICD-10-CM

## 2013-01-20 DIAGNOSIS — I4891 Unspecified atrial fibrillation: Secondary | ICD-10-CM

## 2013-01-20 DIAGNOSIS — R0989 Other specified symptoms and signs involving the circulatory and respiratory systems: Secondary | ICD-10-CM

## 2013-01-20 NOTE — Patient Instructions (Addendum)
Todays INR:1.4.  Dr Laury Axon would like for you to take two 2mg  tablets (4 mg) on Mon,Wed and Friday and one (1) 2mg  tablet on Tues, Thurs,Sat, and Sun.  Recheck INR next week.//AB/CMA

## 2013-01-28 ENCOUNTER — Telehealth: Payer: Self-pay

## 2013-01-28 ENCOUNTER — Ambulatory Visit: Payer: Medicare Other | Admitting: Internal Medicine

## 2013-01-28 ENCOUNTER — Ambulatory Visit (INDEPENDENT_AMBULATORY_CARE_PROVIDER_SITE_OTHER): Payer: Medicare Other

## 2013-01-28 VITALS — BP 122/72 | HR 102 | Temp 97.7°F | Wt 163.8 lb

## 2013-01-28 DIAGNOSIS — I4891 Unspecified atrial fibrillation: Secondary | ICD-10-CM

## 2013-01-28 NOTE — Telephone Encounter (Signed)
LVM with new coumadin directions and follow up. Patient is to follow up at [facility] or [facility] clinic in 10 days.  See OV for specific MD directions.

## 2013-01-28 NOTE — Patient Instructions (Addendum)
Please continue with the same dosing until the results are reviewed by Dr. Drue Novel and we will call you with new instructions.   Call patient at home# [phone]

## 2013-01-28 NOTE — Progress Notes (Addendum)
Quick Note:  Please see instructions at the office visit ______   Last INR 1.5.  She was recommended Coumadin 2 mg: 2 tablets Monday Wednesday and Friday, 1 tablet other days (20 mg weekly)  INR today 4.6  Plan:  Hold coumadin for 2 days, then restart Coumadin 2 mg:  One tablet daily except Wednesday when needs to take 2 tablets (16 mg weekly)  Please schedule a followup in the Coumadin clinic at [facility] or [place] in 10 days  JP

## 2013-01-28 NOTE — Progress Notes (Signed)
  Subjective:    Patient ID: Virginia Price, female    DOB: 1925-12-09, 79 y.o.   MRN: [id number]  HPI Last INR 1.5. She was recommended Coumadin 2 mg: 2 tablets Monday Wednesday and Friday, 1 tablet other days (20 mg weekly) INR today 4.6 Plan: Hold coumadin for 2 days, then restart Coumadin 2 mg:  One tablet daily except Wednesday when needs to take 2 tablets (16 mg weekly) Please schedule a followup in the Coumadin clinic at [facility] or  [place] in 10 days  JP    Review of Systems     Objective:   Physical Exam        Assessment & Plan:

## 2013-01-29 ENCOUNTER — Telehealth: Payer: Self-pay | Admitting: *Deleted

## 2013-01-29 NOTE — Progress Notes (Signed)
Spoke with pt made aware of new dosing instructions and to follow up with [facility] office in 10 days for PT check

## 2013-01-29 NOTE — Telephone Encounter (Signed)
Patient ID: Virginia Price, female DOB: July 05, 1925, 79 y.o. MRN: [id number]  HPI  Last INR 1.5.  She was recommended Coumadin 2 mg: 2 tablets Monday Wednesday and Friday, 1 tablet other days (20 mg weekly)  INR today 4.6  Plan:  Hold coumadin for 2 days, then restart Coumadin 2 mg:  One tablet daily except Wednesday when needs to take 2 tablets (16 mg weekly)  Please schedule a followup in the Coumadin clinic at [facility] or [place] in 10 days  JP

## 2013-01-29 NOTE — Progress Notes (Signed)
Attempted to contact pt concerning recent INR, left message on voice mail to return our call so that we could give further instructions.

## 2013-01-29 NOTE — Telephone Encounter (Signed)
Spoke with pt and advised of new coumadin instructions as well as f/u at [facility] office in 10 days. Pt able to verbalize understanding of instructions.

## 2013-02-05 NOTE — Progress Notes (Signed)
done

## 2013-02-06 ENCOUNTER — Other Ambulatory Visit: Payer: Self-pay | Admitting: Internal Medicine

## 2013-02-06 NOTE — Telephone Encounter (Signed)
rx refilled per protocol. DJR  

## 2013-02-10 ENCOUNTER — Telehealth: Payer: Self-pay | Admitting: *Deleted

## 2013-02-10 NOTE — Telephone Encounter (Signed)
Spoke with pt who stated she forgot to make follow up INR appt. Appt scheduled for 02/11/13 at 10:15 at [facility] office.

## 2013-02-11 ENCOUNTER — Other Ambulatory Visit (INDEPENDENT_AMBULATORY_CARE_PROVIDER_SITE_OTHER): Payer: Medicare Other

## 2013-02-11 ENCOUNTER — Telehealth: Payer: Self-pay | Admitting: General Practice

## 2013-02-11 ENCOUNTER — Other Ambulatory Visit: Payer: Self-pay | Admitting: General Practice

## 2013-02-11 ENCOUNTER — Telehealth: Payer: Self-pay | Admitting: Internal Medicine

## 2013-02-11 ENCOUNTER — Ambulatory Visit (INDEPENDENT_AMBULATORY_CARE_PROVIDER_SITE_OTHER): Payer: Medicare Other | Admitting: General Practice

## 2013-02-11 DIAGNOSIS — Z7901 Long term (current) use of anticoagulants: Secondary | ICD-10-CM

## 2013-02-11 MED ORDER — PHYTONADIONE 5 MG PO TABS: ORAL_TABLET | ORAL | Status: DC

## 2013-02-11 NOTE — Telephone Encounter (Signed)
Stop coumadin ER if headache, nausea, abdominal pain, blood in the stools, blood in the urine, SOB schedule a Nurse visit 02-14-13 for another INR check here at the office

## 2013-02-11 NOTE — Telephone Encounter (Signed)
Spoke with patient and gave instructions to take Vitamin K (2.5) mg now! Also asked patient to stop coumadin for now and re-check INR on Friday.  Instructed patient to go to the ER if any unusual bleeding.

## 2013-02-11 NOTE — Telephone Encounter (Signed)
Called cell phone advised pt  to take 2.5 Vit K and to stop coumadin. Advised of Dr. Leta Jungling orders to go to the ER immediately if she develops a headache, nausea, abdominal pain, blood in the stool or urine or shortness of breath. Patient states she has appt scheduled on 02/14/13 at [facility] to follow up.

## 2013-02-11 NOTE — Telephone Encounter (Signed)
LMOM for patient to call Bailey Mech, RN @ coumadin clinic asap @ [phone].

## 2013-02-11 NOTE — Telephone Encounter (Signed)
Per Drue Novel he wants pt to see you for coumadin care. Has a follow up appt on Friday with Cindy.

## 2013-02-11 NOTE — Telephone Encounter (Signed)
Thank you Arline Asp, she did tell me that. Just wanted to make sure all bases were covered with her.

## 2013-02-11 NOTE — Telephone Encounter (Signed)
Thx, just liked to be sure things were taking care of.

## 2013-02-14 ENCOUNTER — Ambulatory Visit (INDEPENDENT_AMBULATORY_CARE_PROVIDER_SITE_OTHER): Payer: Medicare Other | Admitting: General Practice

## 2013-02-14 DIAGNOSIS — R0989 Other specified symptoms and signs involving the circulatory and respiratory systems: Secondary | ICD-10-CM

## 2013-02-14 DIAGNOSIS — I059 Rheumatic mitral valve disease, unspecified: Secondary | ICD-10-CM

## 2013-02-14 DIAGNOSIS — I4891 Unspecified atrial fibrillation: Secondary | ICD-10-CM

## 2013-02-17 NOTE — Progress Notes (Signed)
The encounter was documented into in error (Canceled appt). See Telephone encounter dated 01-29-13 for documentation.

## 2013-02-25 ENCOUNTER — Ambulatory Visit (INDEPENDENT_AMBULATORY_CARE_PROVIDER_SITE_OTHER): Payer: Medicare Other | Admitting: General Practice

## 2013-02-25 DIAGNOSIS — I6350 Cerebral infarction due to unspecified occlusion or stenosis of unspecified cerebral artery: Secondary | ICD-10-CM

## 2013-02-25 DIAGNOSIS — I4891 Unspecified atrial fibrillation: Secondary | ICD-10-CM

## 2013-02-25 DIAGNOSIS — I639 Cerebral infarction, unspecified: Secondary | ICD-10-CM

## 2013-02-25 DIAGNOSIS — Z7901 Long term (current) use of anticoagulants: Secondary | ICD-10-CM

## 2013-03-17 ENCOUNTER — Telehealth: Payer: Self-pay | Admitting: *Deleted

## 2013-03-17 NOTE — Telephone Encounter (Signed)
Left message stating that samples was left up front for her to pick up.

## 2013-03-17 NOTE — Telephone Encounter (Signed)
Patient was calling to request samples of Vytorin. Is it okay to give? Please advise. SW, CMA

## 2013-03-17 NOTE — Telephone Encounter (Signed)
Yes, 4 to 6 week supply ok

## 2013-03-18 ENCOUNTER — Other Ambulatory Visit: Payer: Self-pay | Admitting: Cardiology

## 2013-03-27 ENCOUNTER — Ambulatory Visit: Payer: Medicare Other

## 2013-03-28 ENCOUNTER — Ambulatory Visit (INDEPENDENT_AMBULATORY_CARE_PROVIDER_SITE_OTHER): Payer: Medicare Other | Admitting: General Practice

## 2013-03-28 DIAGNOSIS — Z7901 Long term (current) use of anticoagulants: Secondary | ICD-10-CM

## 2013-03-28 DIAGNOSIS — I6350 Cerebral infarction due to unspecified occlusion or stenosis of unspecified cerebral artery: Secondary | ICD-10-CM

## 2013-03-28 DIAGNOSIS — I4891 Unspecified atrial fibrillation: Secondary | ICD-10-CM

## 2013-03-28 DIAGNOSIS — I639 Cerebral infarction, unspecified: Secondary | ICD-10-CM

## 2013-03-28 LAB — POCT INR: INR: 2

## 2013-04-01 ENCOUNTER — Ambulatory Visit: Payer: Medicare Other | Admitting: *Deleted

## 2013-04-06 ENCOUNTER — Other Ambulatory Visit: Payer: Self-pay | Admitting: Internal Medicine

## 2013-05-01 LAB — HM MAMMOGRAPHY: HM Mammogram: NEGATIVE

## 2013-05-02 ENCOUNTER — Ambulatory Visit (INDEPENDENT_AMBULATORY_CARE_PROVIDER_SITE_OTHER): Payer: Medicare Other | Admitting: General Practice

## 2013-05-02 DIAGNOSIS — I639 Cerebral infarction, unspecified: Secondary | ICD-10-CM

## 2013-05-02 DIAGNOSIS — Z7901 Long term (current) use of anticoagulants: Secondary | ICD-10-CM

## 2013-05-02 DIAGNOSIS — I6350 Cerebral infarction due to unspecified occlusion or stenosis of unspecified cerebral artery: Secondary | ICD-10-CM

## 2013-05-02 DIAGNOSIS — Z23 Encounter for immunization: Secondary | ICD-10-CM

## 2013-05-02 DIAGNOSIS — I4891 Unspecified atrial fibrillation: Secondary | ICD-10-CM

## 2013-05-02 LAB — POCT INR: INR: 1.8

## 2013-05-02 NOTE — Progress Notes (Signed)
Pre-visit discussion using our clinic review tool. No additional management support is needed unless otherwise documented below in the visit note.  

## 2013-05-05 ENCOUNTER — Encounter: Payer: Self-pay | Admitting: Internal Medicine

## 2013-05-20 ENCOUNTER — Telehealth: Payer: Self-pay | Admitting: *Deleted

## 2013-05-20 NOTE — Telephone Encounter (Signed)
Ok if available, one-month supply

## 2013-05-20 NOTE — Telephone Encounter (Signed)
Patient is requesting samples of Vytorin. Is it okay to give patient samples?  Please advise. SW

## 2013-05-21 ENCOUNTER — Telehealth: Payer: Self-pay | Admitting: *Deleted

## 2013-05-21 NOTE — Telephone Encounter (Signed)
Application completed and pt notified it was ready to be picked up via voice mail.

## 2013-05-21 NOTE — Telephone Encounter (Signed)
Done. DJR  

## 2013-05-21 NOTE — Telephone Encounter (Signed)
05/21/2013  Pt dropped off her Application for Disability Parking Placard.  Attached billing form, put in Sandra's folder.  bw

## 2013-05-30 ENCOUNTER — Ambulatory Visit (INDEPENDENT_AMBULATORY_CARE_PROVIDER_SITE_OTHER): Payer: Medicare Other | Admitting: General Practice

## 2013-05-30 DIAGNOSIS — I639 Cerebral infarction, unspecified: Secondary | ICD-10-CM

## 2013-05-30 DIAGNOSIS — I4891 Unspecified atrial fibrillation: Secondary | ICD-10-CM

## 2013-05-30 DIAGNOSIS — Z7901 Long term (current) use of anticoagulants: Secondary | ICD-10-CM

## 2013-05-30 DIAGNOSIS — I6350 Cerebral infarction due to unspecified occlusion or stenosis of unspecified cerebral artery: Secondary | ICD-10-CM

## 2013-05-30 LAB — POCT INR: INR: 1.4

## 2013-05-30 NOTE — Progress Notes (Signed)
Pre-visit discussion using our clinic review tool. No additional management support is needed unless otherwise documented below in the visit note.  

## 2013-05-31 ENCOUNTER — Other Ambulatory Visit: Payer: Self-pay | Admitting: Internal Medicine

## 2013-05-31 ENCOUNTER — Other Ambulatory Visit: Payer: Self-pay | Admitting: Gastroenterology

## 2013-06-02 NOTE — Telephone Encounter (Signed)
NEEDS OFFICE VISIT.

## 2013-06-17 ENCOUNTER — Ambulatory Visit (INDEPENDENT_AMBULATORY_CARE_PROVIDER_SITE_OTHER): Payer: Medicare Other | Admitting: General Practice

## 2013-06-17 DIAGNOSIS — I639 Cerebral infarction, unspecified: Secondary | ICD-10-CM

## 2013-06-17 DIAGNOSIS — Z7901 Long term (current) use of anticoagulants: Secondary | ICD-10-CM

## 2013-06-17 DIAGNOSIS — I4891 Unspecified atrial fibrillation: Secondary | ICD-10-CM

## 2013-06-17 DIAGNOSIS — I6350 Cerebral infarction due to unspecified occlusion or stenosis of unspecified cerebral artery: Secondary | ICD-10-CM

## 2013-06-17 LAB — POCT INR: INR: 1.7

## 2013-06-17 NOTE — Progress Notes (Signed)
Pre-visit discussion using our clinic review tool. No additional management support is needed unless otherwise documented below in the visit note.  

## 2013-07-07 ENCOUNTER — Telehealth: Payer: Self-pay

## 2013-07-07 NOTE — Telephone Encounter (Signed)
Patients daughter in law presents to the office to pick up Vytorin samples that she had called about last week. (2/2--2/6). No samples in the drawer, no message in chart. Advised that we need to see the patient for CPE plus labs. Most recent check was 2013. Appointment was scheduled for 07/21/2013. Samples given to cover.

## 2013-07-17 ENCOUNTER — Telehealth: Payer: Self-pay

## 2013-07-17 NOTE — Telephone Encounter (Signed)
Medication and allergies:  Reviewed and updated  90 day supply/mail order: na Local pharmacy: [facility] [place]   Immunizations due:  UTD  A/P:   No changes to FH, PSH or Personal Hx Flu vaccine--04/2013 Tdap--02/2005 PNA--05/2007 Shingles--10/2006 MMG--09/2011--benign findings--new order 04/2013 Bone Density--05/2010--nml CCS--11/2007--Dr Stark--incomplete-> proceeded with Barium Enema--nml  To Discuss with Provider: Needs refill on Vytorin

## 2013-07-19 ENCOUNTER — Other Ambulatory Visit: Payer: Self-pay | Admitting: Gastroenterology

## 2013-07-21 ENCOUNTER — Encounter: Payer: Self-pay | Admitting: Internal Medicine

## 2013-07-21 ENCOUNTER — Telehealth: Payer: Self-pay | Admitting: Internal Medicine

## 2013-07-21 ENCOUNTER — Ambulatory Visit (INDEPENDENT_AMBULATORY_CARE_PROVIDER_SITE_OTHER): Payer: Medicare Other | Admitting: Internal Medicine

## 2013-07-21 VITALS — BP 133/76 | HR 98 | Temp 97.9°F | Ht 60.0 in | Wt 161.0 lb

## 2013-07-21 DIAGNOSIS — Z23 Encounter for immunization: Secondary | ICD-10-CM

## 2013-07-21 DIAGNOSIS — E785 Hyperlipidemia, unspecified: Secondary | ICD-10-CM

## 2013-07-21 DIAGNOSIS — I4891 Unspecified atrial fibrillation: Secondary | ICD-10-CM

## 2013-07-21 DIAGNOSIS — I509 Heart failure, unspecified: Secondary | ICD-10-CM

## 2013-07-21 DIAGNOSIS — Z0000 Encounter for general adult medical examination without abnormal findings: Secondary | ICD-10-CM

## 2013-07-21 DIAGNOSIS — I10 Essential (primary) hypertension: Secondary | ICD-10-CM

## 2013-07-21 LAB — COMPREHENSIVE METABOLIC PANEL WITH GFR
ALT: 15 U/L (ref 0–35)
AST: 16 U/L (ref 0–37)
Albumin: 4 g/dL (ref 3.5–5.2)
Alkaline Phosphatase: 70 U/L (ref 39–117)
BUN: 15 mg/dL (ref 6–23)
CO2: 26 meq/L (ref 19–32)
Calcium: 9.5 mg/dL (ref 8.4–10.5)
Chloride: 106 meq/L (ref 96–112)
Creatinine, Ser: 0.8 mg/dL (ref 0.4–1.2)
GFR: 69.41 mL/min
Glucose, Bld: 91 mg/dL (ref 70–99)
Potassium: 4.6 meq/L (ref 3.5–5.1)
Sodium: 139 meq/L (ref 135–145)
Total Bilirubin: 1.1 mg/dL (ref 0.3–1.2)
Total Protein: 6.9 g/dL (ref 6.0–8.3)

## 2013-07-21 LAB — CBC WITH DIFFERENTIAL/PLATELET
Basophils Absolute: 0 10*3/uL (ref 0.0–0.1)
Basophils Relative: 0.3 % (ref 0.0–3.0)
EOS PCT: 0.8 % (ref 0.0–5.0)
Eosinophils Absolute: 0.1 10*3/uL (ref 0.0–0.7)
HCT: 40.2 % (ref 36.0–46.0)
HEMOGLOBIN: 12.9 g/dL (ref 12.0–15.0)
Lymphocytes Relative: 19.3 % (ref 12.0–46.0)
Lymphs Abs: 1.5 10*3/uL (ref 0.7–4.0)
MCHC: 32.2 g/dL (ref 30.0–36.0)
MCV: 85.5 fl (ref 78.0–100.0)
MONO ABS: 0.6 10*3/uL (ref 0.1–1.0)
MONOS PCT: 7.7 % (ref 3.0–12.0)
NEUTROS ABS: 5.6 10*3/uL (ref 1.4–7.7)
Neutrophils Relative %: 71.9 % (ref 43.0–77.0)
Platelets: 263 10*3/uL (ref 150.0–400.0)
RBC: 4.7 Mil/uL (ref 3.87–5.11)
RDW: 15.1 % — ABNORMAL HIGH (ref 11.5–14.6)
WBC: 7.8 10*3/uL (ref 4.5–10.5)

## 2013-07-21 LAB — LIPID PANEL
Cholesterol: 176 mg/dL (ref 0–200)
HDL: 51.5 mg/dL (ref >39.00)
LDL CALC: 96 mg/dL (ref 0–99)
Total CHOL/HDL Ratio: 3
Triglycerides: 145 mg/dL (ref 0.0–149.0)
VLDL: 29 mg/dL (ref 0.0–40.0)
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
## 2013-07-21 NOTE — Assessment & Plan Note (Addendum)
Samples for vytorin provided  ,  due for labs

## 2013-07-21 NOTE — Telephone Encounter (Signed)
Patient's daughter called stating they forgot to ask for rx for Cephalexin 250 mg take 1 capsule at bedtime for prevention of UTI. [facility] [place] [facility] [address]

## 2013-07-21 NOTE — Progress Notes (Signed)
Subjective:    Patient ID: Virginia Price, female    DOB: 06-23-1925, 80 y.o.   MRN: [id number]  DOS:  07/21/2013  Here for Medicare AWV, here w/ Jackelyn Poling (daughter in law): 1. Risk factors based on Past M, S, F history: yes   2.  Physical Activities: less active d/t knee pain, needs help w/ home chores   3. Depression/mood: symptoms well      controlled on current meds   4.  Hearing: some problems, no worse than before, has seen an audiologist before   5.  ADL's: independent , still drives   6.  Fall Risk: no recent falls, prevention discussed    7.  Safety: does feels safe at home   8.  Height, weight, &visual acuity: see VS. vision ok w/ glasses   9.         Counseling: see below   10.       Labs ordered based on risk factors: yes   11.           Referral Coordination: if needed   12.           Care Plan: see below   13.            Cognitive Assessment : memory, alertness  and motor skills seem appropriate  additionally we discussed the following CHF-- on high doses of Lasix, has to urinate frequently and that bothers her, uses depends, sometimes skips Lasix when she needs to leave the house . Atrial fibrillation, there was a misunderstanding and she is not taking Coumadin , for how long? High cholesterol--needs a refill Vytorin, good compliance. History of CAD--has not needed any nitroglycerin  ROS No  CP, SOB No palpitations, no lower extremity edema w/ lasix  Denies  nausea, vomiting diarrhea Denies  blood in the stools occ cough, sputum production + wheezing on-off usually if skips lasix occ dysuria, gross hematuria  no vaginal discharge, genital rash Denies diplopia, slurred speech, motor deficits, facial numbness   Family History: Heart Disease: mother and father Diabetes: mother and father   Breast Ca--no   Colon Cancer-- no Colon Polyps:mother    Past Medical History  Diagnosis Date  . Atrial fibrillation     Coumadin therapy, dx via  Holter monitor, March,  2012, atrial fib heart rate ranges, no more bradycardia or tachycardia while on digoxin  . Chest pain     Catheterization normal 2004, /  nuclear 2006 no ischemia  . Hyperlipidemia   . Hypertension   . Stroke     (10-03 L MCA territory)  . Allergic rhinitis   . GERD (gastroesophageal reflux disease)   . Diverticular disease   . Colon polyp   . Abnormal chest CT     "innumerable small nodules  . Malignant melanoma   . Memory loss   . Restless leg syndrome   . CHF (congestive heart failure)     Diastolic with fluid overload, normal systolic function,, July, 2012.  Atrial fibrillation  . Mitral regurgitation     Mild to moderate, echo, January, 2012  . Erosive esophagitis   . Esophageal stricture   . Fibromyalgia     question of    Past Surgical History  Procedure Laterality Date  . Cholecystectomy  1991  . Radical hysterectomy  1978    hyst and B oophorectomy  . Cataract extraction      bilateral  . Appendectomy    . Mohs surgery  03/2005    History   Social History  . Marital Status: Widowed    Spouse Name: N/A    Number of Children: 3  . Years of Education: N/A   Occupational History  . retired     Social History Main Topics  . Smoking status: Never Smoker   . Smokeless tobacco: Never Used  . Alcohol Use: No  . Drug Use: Not on file  . Sexual Activity: Not on file   Other Topics Concern  . Not on file   Social History Narrative   Lives by herself, 1 daughter in [place], 1 son in [place], 1 daughter in [place]-- still drives    Her neighbor very close to her [name])        Medication List       This list is accurate as of: 07/21/13  5:51 PM.  Always use your most recent med list.               albuterol 108 (90 BASE) MCG/ACT inhaler  Commonly known as:  VENTOLIN HFA  Inhale 2 puffs into the lungs every 6 (six) hours as needed for wheezing.     aspirin 81 MG tablet  Take 81 mg by mouth daily.     buPROPion 100 MG tablet  Commonly  known as:  WELLBUTRIN  1 tab daily and 1 and a half tab on Tuesday and friday     calcium-vitamin D 250-125 MG-UNIT per tablet  Commonly known as:  OSCAL  Take 1 tablet by mouth daily.     ezetimibe-simvastatin 10-40 MG per tablet  Commonly known as:  VYTORIN  Take 1 tablet by mouth at bedtime.     fluticasone 50 MCG/ACT nasal spray  Commonly known as:  FLONASE  Place 2 sprays into the nose daily.     furosemide 40 MG tablet  Commonly known as:  LASIX  Take 1 tablet (40 mg total) by mouth 2 (two) times daily.     loratadine 10 MG tablet  Commonly known as:  CLARITIN  - Take 10 mg by mouth daily as needed. Allergies  -      meclizine 12.5 MG tablet  Commonly known as:  ANTIVERT  TAKE 1 TABLET (12.5 MG TOTAL) BY MOUTH 2 (TWO) TIMES DAILY.     metoprolol 50 MG tablet  Commonly known as:  LOPRESSOR  TAKE 1/2 TABLET BY MOUTH 2 TIMES DAILY     multivitamin per tablet  Take 1 tablet by mouth daily.     nitroGLYCERIN 0.4 MG/SPRAY spray  Commonly known as:  NITROLINGUAL  Place 1 spray under the tongue every 5 (five) minutes as needed. Chest pain     omeprazole 40 MG capsule  Commonly known as:  PRILOSEC  TAKE 1 CAPSULE (40 MG TOTAL) BY MOUTH DAILY.     warfarin 2 MG tablet  Commonly known as:  COUMADIN  TAKE AS DIRECTED           Objective:   Physical Exam BP 133/76  Pulse 98  Temp(Src) 97.9 F (36.6 C)  Ht 5' (1.524 m)  Wt 161 lb (73.029 kg)  BMI 31.44 kg/m2  SpO2 98% General -- alert, well-developed, NAD.  Neck --no thyromegaly  HEENT-- Not pale.   Lungs -- normal respiratory effort, no intercostal retractions, no accessory muscle use, and normal breath sounds.  Heart--irreg  Abdomen-- Not distended, good bowel sounds,soft, non-tender. Extremities-- no pretibial edema bilaterally  Neurologic--  alert & oriented X3. Speech,  gait at baseline and appropriate for age. Strength symmetric in all extremities.  Psych-- Cognition and judgment appear intact.  Cooperative with normal attention span and concentration. No anxious or depressed appearing.      Assessment & Plan:

## 2013-07-21 NOTE — Patient Instructions (Signed)
Get your blood work before you leave   Next visit is for routine check up in 6 months  No need to come back fasting Please make an appointment    Restart Coumadin, check your levels in one week You are due to see Dr. Ron Parker, please call his office and set an appointment   Fall Prevention and Home Safety Falls cause injuries and can affect all age groups. It is possible to use preventive measures to significantly decrease the likelihood of falls. There are many simple measures which can make your home safer and prevent falls. OUTDOORS  Repair cracks and edges of walkways and driveways.  Remove high doorway thresholds.  Trim shrubbery on the main path into your home.  Have good outside lighting.  Clear walkways of tools, rocks, debris, and clutter.  Check that handrails are not broken and are securely fastened. Both sides of steps should have handrails.  Have leaves, snow, and ice cleared regularly.  Use sand or salt on walkways during winter months.  In the garage, clean up grease or oil spills. BATHROOM  Install night lights.  Install grab bars by the toilet and in the tub and shower.  Use non-skid mats or decals in the tub or shower.  Place a plastic non-slip stool in the shower to sit on, if needed.  Keep floors dry and clean up all water on the floor immediately.  Remove soap buildup in the tub or shower on a regular basis.  Secure bath mats with non-slip, double-sided rug tape.  Remove throw rugs and tripping hazards from the floors. BEDROOMS  Install night lights.  Make sure a bedside light is easy to reach.  Do not use oversized bedding.  Keep a telephone by your bedside.  Have a firm chair with side arms to use for getting dressed.  Remove throw rugs and tripping hazards from the floor. KITCHEN  Keep handles on pots and pans turned toward the center of the stove. Use back burners when possible.  Clean up spills quickly and allow time for  drying.  Avoid walking on wet floors.  Avoid hot utensils and knives.  Position shelves so they are not too high or low.  Place commonly used objects within easy reach.  If necessary, use a sturdy step stool with a grab bar when reaching.  Keep electrical cables out of the way.  Do not use floor polish or wax that makes floors slippery. If you must use wax, use non-skid floor wax.  Remove throw rugs and tripping hazards from the floor. STAIRWAYS  Never leave objects on stairs.  Place handrails on both sides of stairways and use them. Fix any loose handrails. Make sure handrails on both sides of the stairways are as long as the stairs.  Check carpeting to make sure it is firmly attached along stairs. Make repairs to worn or loose carpet promptly.  Avoid placing throw rugs at the top or bottom of stairways, or properly secure the rug with carpet tape to prevent slippage. Get rid of throw rugs, if possible.  Have an electrician put in a light switch at the top and bottom of the stairs. OTHER FALL PREVENTION TIPS  Wear low-heel or rubber-soled shoes that are supportive and fit well. Wear closed toe shoes.  When using a stepladder, make sure it is fully opened and both spreaders are firmly locked. Do not climb a closed stepladder.  Add color or contrast paint or tape to grab bars and handrails in  your home. Place contrasting color strips on first and last steps.  Learn and use mobility aids as needed. Install an electrical emergency response system.  Turn on lights to avoid dark areas. Replace light bulbs that burn out immediately. Get light switches that glow.  Arrange furniture to create clear pathways. Keep furniture in the same place.  Firmly attach carpet with non-skid or double-sided tape.  Eliminate uneven floor surfaces.  Select a carpet pattern that does not visually hide the edge of steps.  Be aware of all pets. OTHER HOME SAFETY TIPS  Set the water temperature  for 120 F (48.8 C).  Keep emergency numbers on or near the telephone.  Keep smoke detectors on every level of the home and near sleeping areas. Document Released: 05/05/2002 Document Revised: 11/14/2011 Document Reviewed: 08/04/2011 [facility] Patient Information 2014 [place].

## 2013-07-21 NOTE — Assessment & Plan Note (Signed)
Not taking Coumadin, apparently misunderstanding, recommend to restart Coumadin today and get her INR checked next week

## 2013-07-21 NOTE — Assessment & Plan Note (Addendum)
Controlled, no vol overload on exam. Skips lasix sometimes (how often?)  b/c make her urinate too much. Discussed the need for diuretics, will have to  accept occasional  skipping of lasix (otherwise she won't leave home). Due to see cards, rec to make an appointment  Labs

## 2013-07-21 NOTE — Assessment & Plan Note (Signed)
Well-controlled, no change 

## 2013-07-22 NOTE — Telephone Encounter (Signed)
Spoke with daughter Katharine Look and she is aware that she will need to call the urologist to get a refill on the medication since they are managing her chronic UTI symptoms. She voiced understanding and has agreed to do so.      KP

## 2013-07-22 NOTE — Telephone Encounter (Signed)
To my knowledge, she was not taking cephalexin, we haven't been prescribing it. If it was prescribed by urology, they need to contact them.

## 2013-07-23 ENCOUNTER — Encounter: Payer: Self-pay | Admitting: *Deleted

## 2013-08-05 ENCOUNTER — Telehealth: Payer: Self-pay | Admitting: *Deleted

## 2013-08-05 ENCOUNTER — Encounter: Payer: Self-pay | Admitting: Internal Medicine

## 2013-08-05 ENCOUNTER — Other Ambulatory Visit: Payer: Self-pay | Admitting: Internal Medicine

## 2013-08-05 MED ORDER — MECLIZINE HCL 12.5 MG PO TABS: ORAL_TABLET | ORAL | Status: DC

## 2013-08-05 NOTE — Telephone Encounter (Signed)
rx sent

## 2013-08-05 NOTE — Addendum Note (Signed)
Addended by: Peggyann Shoals on: 08/05/2013 03:51 PM   Modules accepted: Orders

## 2013-08-05 NOTE — Telephone Encounter (Signed)
rx refill meclizine 12.5mg   Last OV- 07/21/13  Last refilled historical provider.

## 2013-08-05 NOTE — Telephone Encounter (Signed)
Has taken meclizine before, okay to call  #60, no refill, same sig

## 2013-08-06 ENCOUNTER — Ambulatory Visit (INDEPENDENT_AMBULATORY_CARE_PROVIDER_SITE_OTHER): Payer: Medicare Other | Admitting: General Practice

## 2013-08-06 DIAGNOSIS — Z5181 Encounter for therapeutic drug level monitoring: Secondary | ICD-10-CM

## 2013-08-06 DIAGNOSIS — I6350 Cerebral infarction due to unspecified occlusion or stenosis of unspecified cerebral artery: Secondary | ICD-10-CM

## 2013-08-06 DIAGNOSIS — Z7901 Long term (current) use of anticoagulants: Secondary | ICD-10-CM

## 2013-08-06 DIAGNOSIS — I639 Cerebral infarction, unspecified: Secondary | ICD-10-CM

## 2013-08-06 DIAGNOSIS — I4891 Unspecified atrial fibrillation: Secondary | ICD-10-CM

## 2013-08-06 LAB — POCT INR: INR: 3

## 2013-08-06 NOTE — Progress Notes (Signed)
Pre visit review using our clinic review tool, if applicable. No additional management support is needed unless otherwise documented below in the visit note. 

## 2013-08-20 ENCOUNTER — Encounter: Payer: Self-pay | Admitting: Cardiology

## 2013-08-20 ENCOUNTER — Ambulatory Visit (INDEPENDENT_AMBULATORY_CARE_PROVIDER_SITE_OTHER): Payer: Medicare Other | Admitting: Cardiology

## 2013-08-20 VITALS — BP 96/62 | HR 104 | Ht 65.0 in | Wt 161.0 lb

## 2013-08-20 DIAGNOSIS — I059 Rheumatic mitral valve disease, unspecified: Secondary | ICD-10-CM

## 2013-08-20 DIAGNOSIS — I4891 Unspecified atrial fibrillation: Secondary | ICD-10-CM

## 2013-08-20 DIAGNOSIS — I340 Nonrheumatic mitral (valve) insufficiency: Secondary | ICD-10-CM

## 2013-08-20 DIAGNOSIS — R079 Chest pain, unspecified: Secondary | ICD-10-CM

## 2013-08-20 DIAGNOSIS — R0602 Shortness of breath: Secondary | ICD-10-CM | POA: Insufficient documentation

## 2013-08-20 DIAGNOSIS — Z5181 Encounter for therapeutic drug level monitoring: Secondary | ICD-10-CM

## 2013-08-20 DIAGNOSIS — Z7901 Long term (current) use of anticoagulants: Secondary | ICD-10-CM

## 2013-08-20 DIAGNOSIS — I509 Heart failure, unspecified: Secondary | ICD-10-CM

## 2013-08-20 DIAGNOSIS — Z79899 Other long term (current) drug therapy: Secondary | ICD-10-CM

## 2013-08-20 MED ORDER — DIGOXIN 125 MCG PO TABS: 0.1250 mg | ORAL_TABLET | Freq: Every day | ORAL | Status: DC

## 2013-08-20 NOTE — Assessment & Plan Note (Signed)
He was noted not long ago that her Coumadin was no longer being taken. This was restarted by Dr. Larose Kells.

## 2013-08-20 NOTE — Assessment & Plan Note (Signed)
The patient's resting heart rate was 100 today. Her rhythm is irregularly irregular. In the past my notes, and on the fact that she had felt better with rate control with digoxin. I decided to restart this. I am aware of all the current recommendations concerning the use of digoxin. The patient's blood pressure is on the low side. There are no other agents that I can used to successfully slower heart rate other than digoxin. This will be done carefully including checking her digoxin level.

## 2013-08-20 NOTE — Assessment & Plan Note (Signed)
The patient is not having any recurrent chest pain. No further workup.

## 2013-08-20 NOTE — Progress Notes (Signed)
Patient ID: Virginia Price, female   DOB: Jan 09, 1926, 80 y.o.   MRN: [id number]    HPI  Patient is seen for cardiology followup. Have seen her over time for shortness of breath. She has diastolic dysfunction. I saw her last in the office in October, 2013.  The patient continues to have some shortness of breath. It is primarily with exertion. She thinks that it has gotten worse since the last time I saw her. As part of today's evaluation I have reviewed multiple old progress notes to determine specific questions about her medications over time. He was noted recently by her primary physician that her Coumadin had been stopped at some point. The reason was not clear and her Coumadin was restarted. In reviewing my notes, it was my understanding she has been on digoxin in the past. She is not on it currently. She has atrial fibrillation. It is noted by her daughter that the patient sometimes does not take her diuretic because she is bothered by having to be around the bathroom.  Allergies  Allergen Reactions  . Ciprofloxacin Hives  . [name] [Ethacrynic Acid]   . Hydrocodone-Acetaminophen     REACTION: nausea  . Metronidazole   . Penicillins   . Prednisone     REACTION: ? reaction  . Sulfamethoxazole-Trimethoprim   . Sulfonamide Derivatives   . Tramadol Hcl     Current Outpatient Prescriptions  Medication Sig Dispense Refill  . albuterol (VENTOLIN HFA) 108 (90 BASE) MCG/ACT inhaler Inhale 2 puffs into the lungs every 6 (six) hours as needed for wheezing.  1 Inhaler  1  . aspirin 81 MG tablet Take 81 mg by mouth daily.        . calcium-vitamin D (OSCAL) 250-125 MG-UNIT per tablet Take 1 tablet by mouth daily.        Marland Kitchen ezetimibe-simvastatin (VYTORIN) 10-40 MG per tablet Take 1 tablet by mouth at bedtime.  30 tablet  6  . fluticasone (FLONASE) 50 MCG/ACT nasal spray Place 2 sprays into the nose as needed.      . furosemide (LASIX) 40 MG tablet Take 20 mg by mouth daily.      . meclizine  (ANTIVERT) 12.5 MG tablet TAKE 1 TABLET (12.5 MG TOTAL) BY MOUTH 2 (TWO) TIMES DAILY.  60 tablet  0  . metoprolol (LOPRESSOR) 50 MG tablet TAKE 1/2 TABLET BY MOUTH 2 TIMES DAILY  60 tablet  6  . multivitamin (THERAGRAN) per tablet Take 1 tablet by mouth daily.        . nitroGLYCERIN (NITROLINGUAL) 0.4 MG/SPRAY spray Place 1 spray under the tongue every 5 (five) minutes as needed. Chest pain      . warfarin (COUMADIN) 2 MG tablet TAKE AS DIRECTED  30 tablet  1  . loratadine (CLARITIN) 10 MG tablet Take 10 mg by mouth daily as needed. Allergies       . [DISCONTINUED] potassium chloride (K-DUR) 10 MEQ tablet Take 1 tablet (10 mEq total) by mouth 2 (two) times daily.  30 tablet  1   No current facility-administered medications for this visit.    History   Social History  . Marital Status: Widowed    Spouse Name: N/A    Number of Children: 3  . Years of Education: N/A   Occupational History  . retired     Social History Main Topics  . Smoking status: Never Smoker   . Smokeless tobacco: Never Used  . Alcohol Use: No  . Drug Use: Not  on file  . Sexual Activity: Not on file   Other Topics Concern  . Not on file   Social History Narrative   Lives by herself, 1 daughter in [place], 1 son in [place], 1 daughter in [place]-- still drives    Her neighbor very close to her Vaughan Basta)    Family History  Problem Relation Age of Onset  . Heart disease Mother   . Colon polyps Mother   . Diabetes Mother   . Heart disease Father   . Diabetes Father     Past Medical History  Diagnosis Date  . Atrial fibrillation     Coumadin therapy, dx via  Holter monitor, March, 2012, atrial fib heart rate ranges, no more bradycardia or tachycardia while on digoxin  . Chest pain     Catheterization normal 2004, /  nuclear 2006 no ischemia  . Hyperlipidemia   . Hypertension   . Stroke     (10-03 L MCA territory)  . Allergic rhinitis   . GERD (gastroesophageal reflux disease)   . Diverticular  disease   . Colon polyp   . Abnormal chest CT     "innumerable small nodules  . Malignant melanoma   . Memory loss   . Restless leg syndrome   . CHF (congestive heart failure)     Diastolic with fluid overload, normal systolic function,, July, 2012.  Atrial fibrillation  . Mitral regurgitation     Mild to moderate, echo, January, 2012  . Erosive esophagitis   . Esophageal stricture   . Fibromyalgia     question of    Past Surgical History  Procedure Laterality Date  . Cholecystectomy  1991  . Radical hysterectomy  1978    hyst and B oophorectomy  . Cataract extraction      bilateral  . Appendectomy    . Mohs surgery      03/2005    Patient Active Problem List   Diagnosis Date Noted  . Mitral regurgitation     Priority: High  . Atrial fibrillation     Priority: High  . CHF (congestive heart failure) 12/12/2010    Priority: High  . Chest pain     Priority: High  . Encounter for therapeutic drug monitoring 08/06/2013  . Warfarin anticoagulation 02/04/2012  . Dyspepsia 11/03/2011  . Wheezing 11/03/2011  . Annual physical exam 09/10/2011  . Ejection fraction <   . Failure to thrive 03/03/2011  . Recurrent UTI 02/28/2011  . GERD (gastroesophageal reflux disease)   . Abnormal chest CT   . DIZZINESS 06/02/2010  . IBS 05/06/2009  . BACK PAIN 08/10/2008  . MALIGNANT MELANOMA, SKIN 11/15/2007  . CARCINOMA, BASAL CELL, HX OF 11/15/2007  . PERSONAL HX COLONIC POLYPS 11/15/2007  . OVERACTIVE BLADDER 06/13/2007  . MEMORY LOSS 06/13/2007  . ALLERGIC RHINITIS 03/16/2007  . GERD 03/16/2007  . RESTLESS LEG SYNDROME 01/30/2007  . HYPERLIPIDEMIA 06/02/2006  . HYPERTENSION 06/02/2006    ROS   Patient denies fever, chills, headache, sweats, rash, change in vision, change in hearing, chest pain, cough, nausea vomiting, urinary symptoms. All other systems are reviewed and are negative.  PHYSICAL EXAM  Patient is oriented to person time and place. Affect is normal. She is  here with her daughter. There is no jugulovenous distention. Lungs are clear. Respiratory effort is nonlabored. Cardiac exam reveals S1 and S2. The rhythm is irregularly irregular. The rate is increased. The abdomen is soft. There is trace peripheral edema. There no musculoskeletal deformities.  There are no skin rashes.  Filed Vitals:   08/20/13 1143  BP: 96/62  Pulse: 104  Height: 5\' 5"  (1.651 m)  Weight: 161 lb (73.029 kg)  SpO2: 96%     ASSESSMENT & PLAN

## 2013-08-20 NOTE — Patient Instructions (Signed)
**Note De-Identified  Obfuscation** Your physician has requested that you have an echocardiogram. Echocardiography is a painless test that uses sound waves to create images of your heart. It provides your doctor with information about the size and shape of your heart and how well your heart's chambers and valves are working. This procedure takes approximately one hour. There are no restrictions for this procedure.  Your physician has recommended you make the following change in your medication: start taking Digoxin 0.125 mg daily  Your physician recommends that you return for lab work in: 2 weeks after starting Digoxin (April 9). Please do not take your Digoxin dose on the morning that lab will be drawn, you should take it after labs are drawn.  Your physician recommends that you schedule a follow-up appointment in: 6 weeks

## 2013-08-20 NOTE — Assessment & Plan Note (Signed)
Mitral regurgitation will be reassessed with her echo to be done.

## 2013-08-20 NOTE — Assessment & Plan Note (Signed)
The patient has had problems with diastolic CHF in the past. She had normal left ventricular function and 2012. She has increased shortness of breath. Yet her blood pressure is low. I cannot push her diuretics artery at this time. It is possible that some of her symptoms may be related to increased atrial fibrillation rate.

## 2013-08-20 NOTE — Assessment & Plan Note (Signed)
Currently she has shortness of breath with walking. She does not have PND or orthopnea. Her blood pressure is 96/55. I will not push her diuretics. She may have some increase in her atrial fib rate that is causing her problems. Digoxin is the only choice of the medication that could help with this. It is being restarted at 0.125 mg daily and we will pay careful attention to the drug level. Followup two-dimensional echo will be done to reassess LV function and mitral regurgitation.  As part of today's evaluation I spent greater than 25 minutes of her total care. More than half of this time was spent discussing all issues with the patient and her daughter.

## 2013-09-04 ENCOUNTER — Other Ambulatory Visit: Payer: Medicare Other

## 2013-09-04 ENCOUNTER — Other Ambulatory Visit (HOSPITAL_COMMUNITY): Payer: Medicare Other

## 2013-09-04 ENCOUNTER — Ambulatory Visit (HOSPITAL_COMMUNITY): Payer: Medicare Other | Attending: Cardiology | Admitting: Radiology

## 2013-09-04 ENCOUNTER — Ambulatory Visit (INDEPENDENT_AMBULATORY_CARE_PROVIDER_SITE_OTHER): Payer: Medicare Other | Admitting: Pharmacist Clinician (PhC)/ Clinical Pharmacy Specialist

## 2013-09-04 DIAGNOSIS — I639 Cerebral infarction, unspecified: Secondary | ICD-10-CM

## 2013-09-04 DIAGNOSIS — Z7901 Long term (current) use of anticoagulants: Secondary | ICD-10-CM

## 2013-09-04 DIAGNOSIS — Z79899 Other long term (current) drug therapy: Principal | ICD-10-CM

## 2013-09-04 DIAGNOSIS — I4891 Unspecified atrial fibrillation: Secondary | ICD-10-CM

## 2013-09-04 DIAGNOSIS — Z5181 Encounter for therapeutic drug level monitoring: Secondary | ICD-10-CM

## 2013-09-04 DIAGNOSIS — R072 Precordial pain: Secondary | ICD-10-CM

## 2013-09-04 DIAGNOSIS — I6350 Cerebral infarction due to unspecified occlusion or stenosis of unspecified cerebral artery: Secondary | ICD-10-CM

## 2013-09-04 LAB — POCT INR: INR: 2.4

## 2013-09-04 NOTE — Progress Notes (Signed)
Echocardiogram Performed. 

## 2013-09-05 ENCOUNTER — Telehealth: Payer: Self-pay | Admitting: Cardiology

## 2013-09-05 ENCOUNTER — Encounter: Payer: Self-pay | Admitting: Cardiology

## 2013-09-05 DIAGNOSIS — I351 Nonrheumatic aortic (valve) insufficiency: Secondary | ICD-10-CM | POA: Insufficient documentation

## 2013-09-05 DIAGNOSIS — I358 Other nonrheumatic aortic valve disorders: Secondary | ICD-10-CM | POA: Insufficient documentation

## 2013-09-05 LAB — DIGOXIN LEVEL: Digoxin Level: 0.7 ng/mL — ABNORMAL LOW (ref 0.8–2.0)

## 2013-09-05 NOTE — Telephone Encounter (Signed)
New message ° ° ° ° ° °Returning a nurses call °

## 2013-09-05 NOTE — Telephone Encounter (Signed)
**Note De-Identified  Obfuscation** The pts daughter, Katharine Look, is advised of the pts Echo and lab work results. She verbalized understanding and states that she will notify the pt of her results.

## 2013-09-08 ENCOUNTER — Other Ambulatory Visit (HOSPITAL_COMMUNITY): Payer: Medicare Other

## 2013-09-11 ENCOUNTER — Other Ambulatory Visit: Payer: Self-pay | Admitting: Internal Medicine

## 2013-09-22 ENCOUNTER — Other Ambulatory Visit: Payer: Self-pay | Admitting: Gastroenterology

## 2013-10-01 ENCOUNTER — Ambulatory Visit (INDEPENDENT_AMBULATORY_CARE_PROVIDER_SITE_OTHER): Payer: Medicare Other | Admitting: General Practice

## 2013-10-01 ENCOUNTER — Ambulatory Visit (INDEPENDENT_AMBULATORY_CARE_PROVIDER_SITE_OTHER)
Admission: RE | Admit: 2013-10-01 | Discharge: 2013-10-01 | Disposition: A | Discharge status: Home / Self Care | Payer: Medicare Other | Source: Ambulatory Visit | Attending: Cardiology | Admitting: Cardiology

## 2013-10-01 ENCOUNTER — Encounter: Payer: Self-pay | Admitting: Cardiology

## 2013-10-01 ENCOUNTER — Ambulatory Visit (INDEPENDENT_AMBULATORY_CARE_PROVIDER_SITE_OTHER): Payer: Medicare Other | Admitting: Cardiology

## 2013-10-01 VITALS — BP 112/68 | HR 88 | Ht 66.0 in | Wt 162.0 lb

## 2013-10-01 DIAGNOSIS — I639 Cerebral infarction, unspecified: Secondary | ICD-10-CM

## 2013-10-01 DIAGNOSIS — Z7901 Long term (current) use of anticoagulants: Secondary | ICD-10-CM

## 2013-10-01 DIAGNOSIS — R0602 Shortness of breath: Secondary | ICD-10-CM

## 2013-10-01 DIAGNOSIS — Z5181 Encounter for therapeutic drug level monitoring: Secondary | ICD-10-CM

## 2013-10-01 DIAGNOSIS — I6350 Cerebral infarction due to unspecified occlusion or stenosis of unspecified cerebral artery: Secondary | ICD-10-CM

## 2013-10-01 DIAGNOSIS — I4891 Unspecified atrial fibrillation: Secondary | ICD-10-CM

## 2013-10-01 DIAGNOSIS — R911 Solitary pulmonary nodule: Secondary | ICD-10-CM | POA: Insufficient documentation

## 2013-10-01 DIAGNOSIS — I059 Rheumatic mitral valve disease, unspecified: Secondary | ICD-10-CM

## 2013-10-01 DIAGNOSIS — I10 Essential (primary) hypertension: Secondary | ICD-10-CM

## 2013-10-01 DIAGNOSIS — I509 Heart failure, unspecified: Secondary | ICD-10-CM

## 2013-10-01 DIAGNOSIS — I340 Nonrheumatic mitral (valve) insufficiency: Secondary | ICD-10-CM

## 2013-10-01 DIAGNOSIS — R079 Chest pain, unspecified: Secondary | ICD-10-CM

## 2013-10-01 LAB — POCT INR: INR: 2.4

## 2013-10-01 NOTE — Assessment & Plan Note (Signed)
Blood pressure is controlled today. It had been low at the time of her last visit. No further workup.

## 2013-10-01 NOTE — Progress Notes (Signed)
Patient ID: Virginia Price, female   DOB: April 02, 1926, 80 y.o.   MRN: [id number]    HPI  Patient is seen today to followup shortness of breath. I saw her last August 20, 2013. She has had shortness of breath. It is primarily with exertion. She is not having PND or orthopnea. I saw her last I was concerned that her resting heart rate might be increased. I decided to very carefully put her back on digoxin because her blood pressure would not tolerate other meds. A followup digoxin level was 0.7. The patient could not sense any definite change with this. Two-dimensional echo was also done. It was technically difficult. However there were no major valvular abnormalities and her ejection fraction was 65-70%.  The patient continues to complain of some exertional shortness of breath. She seems quite stable with this. She is able to get around adequately. Her O2 sat was 96% when checked at the time of her last visit.  Allergies  Allergen Reactions  . Ciprofloxacin Hives  . [name] [Ethacrynic Acid]   . Hydrocodone-Acetaminophen     REACTION: nausea  . Metronidazole   . Penicillins   . Prednisone     REACTION: ? reaction  . Sulfamethoxazole-Trimethoprim   . Sulfonamide Derivatives   . Tramadol Hcl     Current Outpatient Prescriptions  Medication Sig Dispense Refill  . albuterol (VENTOLIN HFA) 108 (90 BASE) MCG/ACT inhaler Inhale 2 puffs into the lungs every 6 (six) hours as needed for wheezing.  1 Inhaler  1  . aspirin 81 MG tablet Take 81 mg by mouth daily.        . calcium-vitamin D (OSCAL) 250-125 MG-UNIT per tablet Take 1 tablet by mouth daily.        . digoxin (LANOXIN) 0.125 MG tablet Take 1 tablet (0.125 mg total) by mouth daily.  30 tablet  3  . fluticasone (FLONASE) 50 MCG/ACT nasal spray Place 2 sprays into the nose as needed.      . loratadine (CLARITIN) 10 MG tablet Take 10 mg by mouth daily as needed. Allergies       . meclizine (ANTIVERT) 12.5 MG tablet TAKE 1 TABLET (12.5 MG TOTAL)  BY MOUTH 2 (TWO) TIMES DAILY.  60 tablet  2  . metoprolol (LOPRESSOR) 50 MG tablet TAKE 1/2 TABLET BY MOUTH 2 TIMES DAILY  60 tablet  6  . multivitamin (THERAGRAN) per tablet Take 1 tablet by mouth daily.        . nitroGLYCERIN (NITROLINGUAL) 0.4 MG/SPRAY spray Place 1 spray under the tongue every 5 (five) minutes as needed. Chest pain      . warfarin (COUMADIN) 2 MG tablet TAKE AS DIRECTED  30 tablet  1  . ezetimibe-simvastatin (VYTORIN) 10-40 MG per tablet Take 1 tablet by mouth at bedtime.  30 tablet  6  . furosemide (LASIX) 40 MG tablet Take 20 mg by mouth daily.      . [DISCONTINUED] potassium chloride (K-DUR) 10 MEQ tablet Take 1 tablet (10 mEq total) by mouth 2 (two) times daily.  30 tablet  1   No current facility-administered medications for this visit.    History   Social History  . Marital Status: Widowed    Spouse Name: N/A    Number of Children: 3  . Years of Education: N/A   Occupational History  . retired     Social History Main Topics  . Smoking status: Never Smoker   . Smokeless tobacco: Never Used  .  Alcohol Use: No  . Drug Use: Not on file  . Sexual Activity: Not on file   Other Topics Concern  . Not on file   Social History Narrative   Lives by herself, 1 daughter in [place], 1 son in [place], 1 daughter in [place]-- still drives    Her neighbor very close to her Vaughan Basta)    Family History  Problem Relation Age of Onset  . Heart disease Mother   . Colon polyps Mother   . Diabetes Mother   . Heart disease Father   . Diabetes Father     Past Medical History  Diagnosis Date  . Atrial fibrillation     Coumadin therapy, dx via  Holter monitor, March, 2012, atrial fib heart rate ranges, no more bradycardia or tachycardia while on digoxin  . Chest pain     Catheterization normal 2004, /  nuclear 2006 no ischemia  . Hyperlipidemia   . Hypertension   . Stroke     (10-03 L MCA territory)  . Allergic rhinitis   . GERD (gastroesophageal reflux disease)    . Diverticular disease   . Colon polyp   . Abnormal chest CT     "innumerable small nodules  . Malignant melanoma   . Memory loss   . Restless leg syndrome   . CHF (congestive heart failure)     Diastolic with fluid overload, normal systolic function,, July, 2012.  Atrial fibrillation  . Mitral regurgitation     Mild to moderate, echo, January, 2012  . Erosive esophagitis   . Esophageal stricture   . Fibromyalgia     question of    Past Surgical History  Procedure Laterality Date  . Cholecystectomy  1991  . Radical hysterectomy  1978    hyst and B oophorectomy  . Cataract extraction      bilateral  . Appendectomy    . Mohs surgery      03/2005    Patient Active Problem List   Diagnosis Date Noted  . Mitral regurgitation     Priority: High  . Atrial fibrillation     Priority: High  . CHF (congestive heart failure) 12/12/2010    Priority: High  . Chest pain     Priority: High  . Aortic insufficiency 09/05/2013  . Aortic valve sclerosis 09/05/2013  . Shortness of breath 08/20/2013  . Encounter for therapeutic drug monitoring 08/06/2013  . Warfarin anticoagulation 02/04/2012  . Dyspepsia 11/03/2011  . Wheezing 11/03/2011  . Annual physical exam 09/10/2011  . Ejection fraction <   . Failure to thrive 03/03/2011  . Recurrent UTI 02/28/2011  . GERD (gastroesophageal reflux disease)   . Abnormal chest CT   . DIZZINESS 06/02/2010  . IBS 05/06/2009  . BACK PAIN 08/10/2008  . MALIGNANT MELANOMA, SKIN 11/15/2007  . CARCINOMA, BASAL CELL, HX OF 11/15/2007  . PERSONAL HX COLONIC POLYPS 11/15/2007  . OVERACTIVE BLADDER 06/13/2007  . MEMORY LOSS 06/13/2007  . ALLERGIC RHINITIS 03/16/2007  . GERD 03/16/2007  . RESTLESS LEG SYNDROME 01/30/2007  . HYPERLIPIDEMIA 06/02/2006  . HYPERTENSION 06/02/2006    ROS   Patient denies fever, chills, headache, sweats, rash, change in vision, change in hearing, chest pain, cough, nausea or vomiting, urinary symptoms. All other  systems are reviewed and are negative.  PHYSICAL EXAM  The patient is here with her daughter. She is oriented to person time and place. Affect is normal. She appears quite stable. Head is atraumatic. Conjunctiva and sclera are normal. There  is no jugulovenous distention. Lungs reveal a few scattered rhonchi. There is no respiratory distress. Cardiac exam reveals that the rhythm is slow and irregularly irregular. The abdomen is soft. There is no significant peripheral edema.  Filed Vitals:   10/01/13 1135  BP: 112/68  Pulse: 88  Height: 5\' 6"  (1.676 m)  Weight: 162 lb (73.483 kg)   EKG is done today and reviewed by me. She continues to have atrial fib. However the resting rate is slow. The rate is 57.  ASSESSMENT & PLAN

## 2013-10-01 NOTE — Assessment & Plan Note (Signed)
I believe her volume status is stable at this time. I've chosen not to change her diuretics.

## 2013-10-01 NOTE — Patient Instructions (Signed)
Your physician has recommended you make the following change in your medication: stop taking Digoxin  A chest x-ray takes a picture of the organs and structures inside the chest, including the heart, lungs, and blood vessels. This test can show several things, including, whether the heart is enlarges; whether fluid is building up in the lungs; and whether pacemaker / defibrillator leads are still in place.  Your physician wants you to follow-up in: 6 months. You will receive a reminder letter in the mail two months in advance. If you don't receive a letter, please call our office to schedule the follow-up appointment.

## 2013-10-01 NOTE — Assessment & Plan Note (Signed)
I have added digoxin back when I saw her last time. However I am concerned that her resting heart rate may be slower than optimal. I've chosen to stop her digoxin. She is anticoagulated.

## 2013-10-01 NOTE — Progress Notes (Signed)
Pre visit review using our clinic review tool, if applicable. No additional management support is needed unless otherwise documented below in the visit note. 

## 2013-10-01 NOTE — Assessment & Plan Note (Signed)
She's not having any significant chest pain. No further workup.

## 2013-10-01 NOTE — Assessment & Plan Note (Signed)
Her valvular disease remains mild by echo. No further workup.

## 2013-10-01 NOTE — Assessment & Plan Note (Signed)
Coumadin will be continued for her atrial fibrillation.

## 2013-10-01 NOTE — Assessment & Plan Note (Signed)
Unfortunately the patient continues to have some shortness of breath. I believe her volume status is stable. We will proceed with a 2 view chest x-ray to be sure that there are no unexpected findings. If this does not show any unusual findings, no further workup will be done at this time. I will see the patient back for followup. I encouraged her to remain active.

## 2013-10-03 ENCOUNTER — Telehealth: Payer: Self-pay | Admitting: Cardiology

## 2013-10-03 NOTE — Telephone Encounter (Signed)
Katharine Look, the pts daughter is advised of the pts chest x-ray, she verbalized understanding.

## 2013-10-03 NOTE — Telephone Encounter (Signed)
Patients daughter is returning your call. Please call and advise.

## 2013-10-08 ENCOUNTER — Telehealth: Payer: Self-pay | Admitting: Internal Medicine

## 2013-10-08 NOTE — Telephone Encounter (Signed)
Caller name:Ketty Legore Relation to MW:[id number]  Call back [phone] [id number] [place], [place] - [address]   Reason for call: to request a refill for ezetimibe-simvastatin (VYTORIN) 10-40 MG per tablet (Expired)

## 2013-10-09 MED ORDER — EZETIMIBE-SIMVASTATIN 10-40 MG PO TABS: 1.0000 | ORAL_TABLET | Freq: Every day | ORAL | Status: DC

## 2013-10-09 NOTE — Telephone Encounter (Signed)
Done . rx sent  

## 2013-10-15 ENCOUNTER — Other Ambulatory Visit: Payer: Self-pay | Admitting: Cardiology

## 2013-10-15 ENCOUNTER — Telehealth: Payer: Self-pay | Admitting: Internal Medicine

## 2013-10-15 NOTE — Telephone Encounter (Signed)
Caller name: Katharine Look Relation to pt: daughter Call back number: [phone] Pharmacy: [id number] - [place], [place] [address]  Reason for call:   Pt is needing something cheaper than the RX ezetimibe-simvastatin (VYTORIN) 10-40 MG . Please contact pt to advise of options.

## 2013-10-16 NOTE — Telephone Encounter (Signed)
Kim, can you please look at the dose for this? Thanks, MI

## 2013-10-16 NOTE — Telephone Encounter (Signed)
After reviewing patient's formulary, it looks like Vytorin is a Tier 4. Lower tier alternatives include fluvastatin, lovastatin, pravastatin sodium, simvastatin, Niacor (which all are tier 1 or tier 2). Some tier 3 options are Zetia, Welchol, Niacin ER, and Crestor. Would any of the alternatives be an appropriate replacement of Vytorin? Please advise. JG//CMA

## 2013-10-16 NOTE — Telephone Encounter (Signed)
Was switch from simvastatin to Vytorin 2013 because cholesterol was not sufficiently controlled. 2 options: Switch to simvastatin 40 mg one by mouth each bedtime To switch to Crestor 20 one by mouth each bedtime, Crestor may be more expensive. Whatever the patient decide is ok with me, followup fasting by 12-2013 will recheck labs then

## 2013-10-17 ENCOUNTER — Other Ambulatory Visit: Payer: Self-pay | Admitting: Cardiology

## 2013-10-17 MED ORDER — SIMVASTATIN 40 MG PO TABS: 40.0000 mg | ORAL_TABLET | Freq: Every day | ORAL | Status: DC

## 2013-10-17 NOTE — Telephone Encounter (Signed)
Called and spoke with patient's daughter, Katharine Look. She is ok with changing to Simvastatin 40 mg. [facility] JG//CMA

## 2013-10-21 ENCOUNTER — Other Ambulatory Visit: Payer: Self-pay | Admitting: Cardiology

## 2013-10-23 ENCOUNTER — Other Ambulatory Visit: Payer: Self-pay | Admitting: *Deleted

## 2013-10-23 MED ORDER — MECLIZINE HCL 12.5 MG PO TABS: ORAL_TABLET | ORAL | Status: DC

## 2013-11-04 ENCOUNTER — Other Ambulatory Visit: Payer: Self-pay | Admitting: Internal Medicine

## 2013-11-12 ENCOUNTER — Ambulatory Visit (INDEPENDENT_AMBULATORY_CARE_PROVIDER_SITE_OTHER): Payer: Medicare Other | Admitting: General Practice

## 2013-11-12 DIAGNOSIS — I4891 Unspecified atrial fibrillation: Secondary | ICD-10-CM

## 2013-11-12 DIAGNOSIS — I6350 Cerebral infarction due to unspecified occlusion or stenosis of unspecified cerebral artery: Secondary | ICD-10-CM

## 2013-11-12 DIAGNOSIS — I639 Cerebral infarction, unspecified: Secondary | ICD-10-CM

## 2013-11-12 DIAGNOSIS — Z7901 Long term (current) use of anticoagulants: Secondary | ICD-10-CM

## 2013-11-12 DIAGNOSIS — Z5181 Encounter for therapeutic drug level monitoring: Secondary | ICD-10-CM

## 2013-11-12 LAB — POCT INR: INR: 2.7

## 2013-11-12 NOTE — Progress Notes (Signed)
Pre visit review using our clinic review tool, if applicable. No additional management support is needed unless otherwise documented below in the visit note. 

## 2013-11-28 ENCOUNTER — Other Ambulatory Visit: Payer: Self-pay | Admitting: Internal Medicine

## 2013-12-30 ENCOUNTER — Ambulatory Visit (INDEPENDENT_AMBULATORY_CARE_PROVIDER_SITE_OTHER): Payer: Medicare Other | Admitting: Family Medicine

## 2013-12-30 DIAGNOSIS — Z7901 Long term (current) use of anticoagulants: Secondary | ICD-10-CM

## 2013-12-30 DIAGNOSIS — I639 Cerebral infarction, unspecified: Secondary | ICD-10-CM

## 2013-12-30 DIAGNOSIS — I4891 Unspecified atrial fibrillation: Secondary | ICD-10-CM

## 2013-12-30 DIAGNOSIS — Z5181 Encounter for therapeutic drug level monitoring: Secondary | ICD-10-CM

## 2013-12-30 DIAGNOSIS — I6350 Cerebral infarction due to unspecified occlusion or stenosis of unspecified cerebral artery: Secondary | ICD-10-CM

## 2013-12-30 LAB — POCT INR: INR: 1.6

## 2014-01-19 ENCOUNTER — Other Ambulatory Visit: Payer: Self-pay | Admitting: Internal Medicine

## 2014-01-28 ENCOUNTER — Ambulatory Visit (INDEPENDENT_AMBULATORY_CARE_PROVIDER_SITE_OTHER): Payer: Medicare Other | Admitting: *Deleted

## 2014-01-28 DIAGNOSIS — Z7901 Long term (current) use of anticoagulants: Secondary | ICD-10-CM

## 2014-01-28 DIAGNOSIS — I6350 Cerebral infarction due to unspecified occlusion or stenosis of unspecified cerebral artery: Secondary | ICD-10-CM

## 2014-01-28 DIAGNOSIS — I4891 Unspecified atrial fibrillation: Secondary | ICD-10-CM

## 2014-01-28 DIAGNOSIS — I639 Cerebral infarction, unspecified: Secondary | ICD-10-CM

## 2014-01-28 DIAGNOSIS — Z5181 Encounter for therapeutic drug level monitoring: Secondary | ICD-10-CM

## 2014-01-28 LAB — POCT INR: INR: 1.7

## 2014-03-11 ENCOUNTER — Ambulatory Visit (INDEPENDENT_AMBULATORY_CARE_PROVIDER_SITE_OTHER): Payer: Medicare Other | Admitting: *Deleted

## 2014-03-11 DIAGNOSIS — Z5181 Encounter for therapeutic drug level monitoring: Secondary | ICD-10-CM

## 2014-03-11 LAB — POCT INR: INR: 2.4

## 2014-04-01 ENCOUNTER — Encounter: Payer: Self-pay | Admitting: Cardiology

## 2014-04-01 ENCOUNTER — Ambulatory Visit (INDEPENDENT_AMBULATORY_CARE_PROVIDER_SITE_OTHER): Payer: Medicare Other | Admitting: Cardiology

## 2014-04-01 VITALS — BP 102/70 | HR 96 | Ht 67.0 in | Wt 164.4 lb

## 2014-04-01 DIAGNOSIS — Z7901 Long term (current) use of anticoagulants: Secondary | ICD-10-CM

## 2014-04-01 DIAGNOSIS — I482 Chronic atrial fibrillation, unspecified: Secondary | ICD-10-CM

## 2014-04-01 DIAGNOSIS — R0602 Shortness of breath: Secondary | ICD-10-CM

## 2014-04-01 NOTE — Assessment & Plan Note (Signed)
The patient has intermittent shortness of breath. Overall I feel that she stable. She has good left ventricular function. She does not have severe valvular disease. Her atrial fib is under reasonable control. There is no definite volume abnormality. I feel that we should not work this up any further. She is active and this is not limiting her severely.

## 2014-04-01 NOTE — Progress Notes (Signed)
Patient ID: Virginia Price, female   DOB: [birth year], 79 y.o.   MRN: [id number]    HPI   The patient is seen today to follow-up shortness of breath. She has good left jugular function. I saw her last May, 2015. Since that time she stable. She does get around but has some intermittent shortness of breath. There is no PND or orthopnea. Chest x-ray was done after the last visit. She had stable lung disease. There was no evidence of CHF.  Allergies  Allergen Reactions  . Ciprofloxacin Hives  . Penicillins Rash  . Sulfamethoxazole-Trimethoprim Rash  . Sulfonamide Derivatives Rash  . [name] [Ethacrynic Acid] Other (See Comments)    UNKNOWN REACTION  . Hydrocodone-Acetaminophen Nausea Only  . Metronidazole Other (See Comments)    UNKNOWN REACTION  . Prednisone Other (See Comments)    UNKNOWN REACTION  . Tramadol Hcl Other (See Comments)    Sleepy    Current Outpatient Prescriptions  Medication Sig Dispense Refill  . albuterol (VENTOLIN HFA) 108 (90 BASE) MCG/ACT inhaler Inhale 2 puffs into the lungs every 6 (six) hours as needed for wheezing. 1 Inhaler 1  . calcium-vitamin D (OSCAL) 250-125 MG-UNIT per tablet Take 1 tablet by mouth daily.      . fluticasone (FLONASE) 50 MCG/ACT nasal spray Place 2 sprays into the nose as needed for allergies or rhinitis.     . furosemide (LASIX) 40 MG tablet Take 0.5 tablets (20 mg total) by mouth daily. 30 tablet 6  . loratadine (CLARITIN) 10 MG tablet Take 10 mg by mouth daily as needed. Allergies     . metoprolol (LOPRESSOR) 50 MG tablet TAKE 1/2 TABLET BY MOUTH 2 TIMES DAILY 60 tablet 6  . multivitamin (THERAGRAN) per tablet Take 1 tablet by mouth daily.      . nitroGLYCERIN (NITROLINGUAL) 0.4 MG/SPRAY spray Place 1 spray under the tongue every 5 (five) minutes x 3 doses as needed. Chest pain    . simvastatin (ZOCOR) 40 MG tablet Take 1 tablet (40 mg total) by mouth at bedtime. 90 tablet 1  . warfarin (COUMADIN) 2 MG tablet TAKE AS DIRECTED (Patient  taking differently: TAKE AS DIRECTED BY THE COUMADIN CLINIC) 30 tablet 1  . [DISCONTINUED] potassium chloride (K-DUR) 10 MEQ tablet Take 1 tablet (10 mEq total) by mouth 2 (two) times daily. 30 tablet 1   No current facility-administered medications for this visit.    History   Social History  . Marital Status: Widowed    Spouse Name: N/A    Number of Children: 3  . Years of Education: N/A   Occupational History  . retired     Social History Main Topics  . Smoking status: Never Smoker   . Smokeless tobacco: Never Used  . Alcohol Use: No  . Drug Use: Not on file  . Sexual Activity: Not on file   Other Topics Concern  . Not on file   Social History Narrative   Lives by herself, 1 daughter in [place], 1 son in [place], 1 daughter in [place]-- still drives    Her neighbor very close to her Vaughan Basta)    Family History  Problem Relation Age of Onset  . Heart disease Mother   . Colon polyps Mother   . Diabetes Mother   . Heart disease Father   . Diabetes Father     Past Medical History  Diagnosis Date  . Atrial fibrillation     Coumadin therapy, dx via  Holter monitor, March,  2012, atrial fib heart rate ranges, no more bradycardia or tachycardia while on digoxin  . Chest pain     Catheterization normal 2004, /  nuclear 2006 no ischemia  . Hyperlipidemia   . Hypertension   . Stroke     (10-03 L MCA territory)  . Allergic rhinitis   . GERD (gastroesophageal reflux disease)   . Diverticular disease   . Colon polyp   . Abnormal chest CT     "innumerable small nodules  . Malignant melanoma   . Memory loss   . Restless leg syndrome   . CHF (congestive heart failure)     Diastolic with fluid overload, normal systolic function,, July, 2012.  Atrial fibrillation  . Mitral regurgitation     Mild to moderate, echo, January, 2012  . Erosive esophagitis   . Esophageal stricture   . Fibromyalgia     question of    Past Surgical History  Procedure Laterality Date  .  Cholecystectomy  1991  . Radical hysterectomy  1978    hyst and B oophorectomy  . Cataract extraction      bilateral  . Appendectomy    . Mohs surgery      03/2005    Patient Active Problem List   Diagnosis Date Noted  . Mitral regurgitation     Priority: High  . Atrial fibrillation     Priority: High  . CHF (congestive heart failure) 12/12/2010    Priority: High  . Chest pain     Priority: High  . Lung nodule 10/01/2013  . Aortic insufficiency 09/05/2013  . Aortic valve sclerosis 09/05/2013  . Shortness of breath 08/20/2013  . Encounter for therapeutic drug monitoring 08/06/2013  . Warfarin anticoagulation 02/04/2012  . Dyspepsia 11/03/2011  . Wheezing 11/03/2011  . Annual physical exam 09/10/2011  . Ejection fraction <   . Failure to thrive 03/03/2011  . Recurrent UTI 02/28/2011  . GERD (gastroesophageal reflux disease)   . Abnormal chest CT   . DIZZINESS 06/02/2010  . IBS 05/06/2009  . BACK PAIN 08/10/2008  . MALIGNANT MELANOMA, SKIN 11/15/2007  . CARCINOMA, BASAL CELL, HX OF 11/15/2007  . PERSONAL HX COLONIC POLYPS 11/15/2007  . OVERACTIVE BLADDER 06/13/2007  . MEMORY LOSS 06/13/2007  . ALLERGIC RHINITIS 03/16/2007  . GERD 03/16/2007  . RESTLESS LEG SYNDROME 01/30/2007  . HYPERLIPIDEMIA 06/02/2006  . HYPERTENSION 06/02/2006    ROS   patient denies fever, chills, headache, sweats, rash, change in vision, change in hearing, chest pain, cough, nausea or vomiting, urinary symptoms. All other systems are reviewed and are negative.  PHYSICAL EXAM  patient is here with her daughter. She stable. She is oriented to person time and place. Affect is normal. Head is atraumatic. Sclera and conjunctiva are normal. There is no jugular venous distention. Lungs are clear. Respiratory effort is unlabored. Cardiac exam reveals S1 and S2. Abdomen is no peripheral edema.  Filed Vitals:   04/01/14 0930  BP: 102/70  Pulse: 96  Height: 5\' 7"  (1.702 m)  Weight: 164 lb 6.4 oz  (74.571 kg)     ASSESSMENT & PLAN

## 2014-04-01 NOTE — Patient Instructions (Signed)
Your physician recommends that you continue on your current medications as directed. Please refer to the Current Medication list given to you today.  Your physician wants you to follow-up in: 6 months. You will receive a reminder letter in the mail two months in advance. If you don't receive a letter, please call our office to schedule the follow-up appointment.  

## 2014-04-01 NOTE — Assessment & Plan Note (Signed)
Over time I have adjusted meds for her heart rate. In May, 2015 I stopped her low-dose digoxin. Her resting rate today is a little higher than I would like. However it is reasonable. I've chosen not to make any changes.

## 2014-04-01 NOTE — Assessment & Plan Note (Signed)
Warfarin discontinued for her atrial fibrillation.

## 2014-04-08 ENCOUNTER — Other Ambulatory Visit: Payer: Self-pay | Admitting: Internal Medicine

## 2014-05-05 ENCOUNTER — Ambulatory Visit (INDEPENDENT_AMBULATORY_CARE_PROVIDER_SITE_OTHER): Payer: Medicare Other

## 2014-05-05 ENCOUNTER — Telehealth: Payer: Self-pay | Admitting: Family

## 2014-05-05 DIAGNOSIS — Z5181 Encounter for therapeutic drug level monitoring: Secondary | ICD-10-CM

## 2014-05-05 LAB — POCT INR: INR: 1.6

## 2014-05-05 NOTE — Telephone Encounter (Signed)
Agree with plan 

## 2014-05-19 ENCOUNTER — Other Ambulatory Visit: Payer: Self-pay

## 2014-05-19 ENCOUNTER — Telehealth: Payer: Self-pay | Admitting: Internal Medicine

## 2014-05-19 MED ORDER — WARFARIN SODIUM 2 MG PO TABS: ORAL_TABLET | ORAL | Status: DC

## 2014-05-19 MED ORDER — METOPROLOL TARTRATE 50 MG PO TABS: ORAL_TABLET | ORAL | Status: DC

## 2014-05-19 NOTE — Telephone Encounter (Signed)
As of today, have not received a refill request for this Pt, [facility] Please inform Pt she was due for F/U visit with Dr. Larose Kells in August. She will need to be seen for any further refills.

## 2014-05-19 NOTE — Telephone Encounter (Signed)
Caller name:Virginia Price, Virginia Price Relation to YJ:WLKH Call back [phone] [facility] [place]  Reason for call: pt states pharmacy sent request over last week for her rx warfarin (COUMADIN) 2 MG tablet  And metoprolol (LOPRESSOR) 50 MG tablet , but has not heard anything pt is needing rx

## 2014-05-27 ENCOUNTER — Ambulatory Visit (INDEPENDENT_AMBULATORY_CARE_PROVIDER_SITE_OTHER): Payer: Medicare Other | Admitting: Family Medicine

## 2014-05-27 DIAGNOSIS — Z5181 Encounter for therapeutic drug level monitoring: Secondary | ICD-10-CM

## 2014-05-27 LAB — POCT INR: INR: 2.7

## 2014-06-12 ENCOUNTER — Encounter: Payer: Self-pay | Admitting: Internal Medicine

## 2014-06-12 ENCOUNTER — Ambulatory Visit (INDEPENDENT_AMBULATORY_CARE_PROVIDER_SITE_OTHER): Payer: Medicare Other | Admitting: Internal Medicine

## 2014-06-12 VITALS — BP 116/79 | HR 98 | Temp 97.8°F | Ht 67.0 in | Wt 166.4 lb

## 2014-06-12 DIAGNOSIS — I10 Essential (primary) hypertension: Secondary | ICD-10-CM

## 2014-06-12 DIAGNOSIS — M159 Polyosteoarthritis, unspecified: Secondary | ICD-10-CM

## 2014-06-12 DIAGNOSIS — I482 Chronic atrial fibrillation, unspecified: Secondary | ICD-10-CM

## 2014-06-12 DIAGNOSIS — M150 Primary generalized (osteo)arthritis: Secondary | ICD-10-CM | POA: Diagnosis not present

## 2014-06-12 DIAGNOSIS — E785 Hyperlipidemia, unspecified: Secondary | ICD-10-CM | POA: Diagnosis not present

## 2014-06-12 LAB — BASIC METABOLIC PANEL
BUN: 18 mg/dL (ref 6–23)
CALCIUM: 9.5 mg/dL (ref 8.4–10.5)
CHLORIDE: 106 meq/L (ref 96–112)
CO2: 27 mEq/L (ref 19–32)
Creatinine, Ser: 0.83 mg/dL (ref 0.40–1.20)
GFR: 69.26 mL/min (ref >60.00)
Glucose, Bld: 101 mg/dL — ABNORMAL HIGH (ref 70–99)
POTASSIUM: 4.2 meq/L (ref 3.5–5.1)
Sodium: 140 mEq/L (ref 135–145)

## 2014-06-12 LAB — ALT: ALT: 16 U/L (ref 0–35)

## 2014-06-12 LAB — CBC WITH DIFFERENTIAL/PLATELET
BASOS ABS: 0 10*3/uL (ref 0.0–0.1)
BASOS PCT: 0.4 % (ref 0.0–3.0)
EOS ABS: 0.1 10*3/uL (ref 0.0–0.7)
Eosinophils Relative: 0.9 % (ref 0.0–5.0)
HEMATOCRIT: 41.9 % (ref 36.0–46.0)
Hemoglobin: 13.5 g/dL (ref 12.0–15.0)
LYMPHS ABS: 1.5 10*3/uL (ref 0.7–4.0)
LYMPHS PCT: 19.7 % (ref 12.0–46.0)
MCHC: 32.3 g/dL (ref 30.0–36.0)
MCV: 88.4 fl (ref 78.0–100.0)
Monocytes Absolute: 0.5 10*3/uL (ref 0.1–1.0)
Monocytes Relative: 7.3 % (ref 3.0–12.0)
Neutro Abs: 5.3 10*3/uL (ref 1.4–7.7)
Neutrophils Relative %: 71.7 % (ref 43.0–77.0)
Platelets: 256 10*3/uL (ref 150.0–400.0)
RBC: 4.74 Mil/uL (ref 3.87–5.11)
RDW: 14.8 % (ref 11.5–15.5)
WBC: 7.4 10*3/uL (ref 4.0–10.5)

## 2014-06-12 LAB — LIPID PANEL
CHOLESTEROL: 226 mg/dL — AB (ref 0–200)
HDL: 52.6 mg/dL (ref >39.00)
NonHDL: 173.4
Total CHOL/HDL Ratio: 4
Triglycerides: 221 mg/dL — ABNORMAL HIGH (ref 0.0–149.0)
VLDL: 44.2 mg/dL — ABNORMAL HIGH (ref 0.0–40.0)

## 2014-06-12 LAB — LDL CHOLESTEROL, DIRECT: Direct LDL: 144 mg/dL

## 2014-06-12 LAB — AST: AST: 22 U/L (ref 0–37)

## 2014-06-12 MED ORDER — SIMVASTATIN 40 MG PO TABS: 40.0000 mg | ORAL_TABLET | Freq: Every day | ORAL | Status: DC

## 2014-06-12 MED ORDER — METOPROLOL TARTRATE 50 MG PO TABS: ORAL_TABLET | ORAL | Status: DC

## 2014-06-12 MED ORDER — ALBUTEROL SULFATE HFA 108 (90 BASE) MCG/ACT IN AERS: 2.0000 | INHALATION_SPRAY | Freq: Four times a day (QID) | RESPIRATORY_TRACT | Status: DC | PRN

## 2014-06-12 MED ORDER — WARFARIN SODIUM 2 MG PO TABS: ORAL_TABLET | ORAL | Status: DC

## 2014-06-12 NOTE — Assessment & Plan Note (Signed)
But compliance with meds, BP satisfactory, check a BMP

## 2014-06-12 NOTE — Progress Notes (Signed)
Pre visit review using our clinic review tool, if applicable. No additional management support is needed unless otherwise documented below in the visit note. 

## 2014-06-12 NOTE — Assessment & Plan Note (Signed)
Good compliance with medications, due for labs

## 2014-06-12 NOTE — Progress Notes (Signed)
Subjective:    Patient ID: Virginia Price, female    DOB: 1926/01/31, 81 y.o.   MRN: [id number]  DOS:  06/12/2014 Type of visit - description : rov Interval history,  here with her daughter. Medications reviewed. Good compliance. Labs reviewed, due  for a BMP, LFTs, FLP and CBC Recently saw cardiology,note checked, felt to be stable.  ROS Reports he has problems with her left knee, already saw orthopedic surgery, was told not a candidate for surgery, getting local injections with minimal relief. Takes ibuprofen as needed.  Past Medical History  Diagnosis Date  . Atrial fibrillation     Coumadin therapy, dx via  Holter monitor, March, 2012, atrial fib heart rate ranges, no more bradycardia or tachycardia while on digoxin  . Chest pain     Catheterization normal 2004, /  nuclear 2006 no ischemia  . Hyperlipidemia   . Hypertension   . Stroke     (10-03 L MCA territory)  . Allergic rhinitis   . GERD (gastroesophageal reflux disease)   . Diverticular disease   . Colon polyp   . Abnormal chest CT     "innumerable small nodules  . Malignant melanoma   . Memory loss   . Restless leg syndrome   . CHF (congestive heart failure)     Diastolic with fluid overload, normal systolic function,, July, 2012.  Atrial fibrillation  . Mitral regurgitation     Mild to moderate, echo, January, 2012  . Erosive esophagitis   . Esophageal stricture   . Fibromyalgia     question of    Past Surgical History  Procedure Laterality Date  . Cholecystectomy  1991  . Radical hysterectomy  1978    hyst and B oophorectomy  . Cataract extraction      bilateral  . Appendectomy    . Mohs surgery      03/2005    History   Social History  . Marital Status: Widowed    Spouse Name: N/A    Number of Children: 3  . Years of Education: N/A   Occupational History  . retired     Social History Main Topics  . Smoking status: Never Smoker   . Smokeless tobacco: Never Used  . Alcohol Use: No  .  Drug Use: Not on file  . Sexual Activity: Not on file   Other Topics Concern  . Not on file   Social History Narrative   Lives by herself, 1 daughter in [place], 1 son in [place], 1 daughter in [place]-- still drives    Her neighbor very close to her Vaughan Basta)        Medication List       This list is accurate as of: 06/12/14 11:01 AM.  Always use your most recent med list.               albuterol 108 (90 BASE) MCG/ACT inhaler  Commonly known as:  VENTOLIN HFA  Inhale 2 puffs into the lungs every 6 (six) hours as needed for wheezing.     calcium-vitamin D 250-125 MG-UNIT per tablet  Commonly known as:  OSCAL  Take 1 tablet by mouth daily.     cephALEXin 250 MG capsule  Commonly known as:  KEFLEX  Take 250 mg by mouth daily.     fluticasone 50 MCG/ACT nasal spray  Commonly known as:  FLONASE  Place 2 sprays into the nose as needed for allergies or rhinitis.     furosemide  40 MG tablet  Commonly known as:  LASIX  Take 0.5 tablets (20 mg total) by mouth daily.     loratadine 10 MG tablet  Commonly known as:  CLARITIN  - Take 10 mg by mouth daily as needed. Allergies  -      metoprolol 50 MG tablet  Commonly known as:  LOPRESSOR  TAKE 1/2 TABLET BY MOUTH 2 TIMES DAILY     multivitamin per tablet  Take 1 tablet by mouth daily.     nitroGLYCERIN 0.4 MG/SPRAY spray  Commonly known as:  NITROLINGUAL  Place 1 spray under the tongue every 5 (five) minutes x 3 doses as needed. Chest pain     simvastatin 40 MG tablet  Commonly known as:  ZOCOR  Take 1 tablet (40 mg total) by mouth at bedtime.     warfarin 2 MG tablet  Commonly known as:  COUMADIN  TAKE AS DIRECTED           Objective:   Physical Exam BP 116/79 mmHg  Pulse 98  Temp(Src) 97.8 F (36.6 C) (Oral)  Ht 5\' 7"  (1.702 m)  Wt 166 lb 6 oz (75.467 kg)  BMI 26.05 kg/m2  SpO2 98% General -- alert, well-developed, NAD.   Lungs -- normal respiratory effort, no intercostal retractions, no accessory  muscle use, and normal breath sounds.  Heart--irreg.   Extremities-- no pretibial edema bilaterally  Neurologic--  alert & oriented X3. Speech normal, gait difficult d/t DJD   Psych-- Cognition and judgment appear intact. Cooperative with normal attention span and concentration. No anxious or depressed appearing.        Assessment & Plan:

## 2014-06-12 NOTE — Patient Instructions (Signed)
Get your blood work before you leave   Tylenol  500 mg OTC 2 tabs a day every 8 hours as needed for pain  Avoid ibuprofen, naproxen, healthy, Motrin or similar medications.  Please come back to the office in 4 months  for a physical exam. No  fasting    Schedule the visit at the front desk

## 2014-06-12 NOTE — Assessment & Plan Note (Signed)
On Coumadin, heart rate is slightly elevated today, management per cardiology.

## 2014-06-14 DIAGNOSIS — M1990 Unspecified osteoarthritis, unspecified site: Secondary | ICD-10-CM | POA: Insufficient documentation

## 2014-06-14 NOTE — Assessment & Plan Note (Signed)
Having pain in the left knee, saw orthopedic surgery, not a candidate for surgery. She can't  tolerate hydrocodone, unable to take NSAIDs due to Coumadin, tramadol was not effective in the past. Recommend to take Tylenol, ice, Aspercreme. Use a Rehm consistently. Avoid all NSAIDs

## 2014-06-16 ENCOUNTER — Other Ambulatory Visit: Payer: Self-pay | Admitting: Internal Medicine

## 2014-06-26 NOTE — Telephone Encounter (Signed)
Pt has appt on 06/30/14

## 2014-06-26 NOTE — Telephone Encounter (Signed)
Noted  

## 2014-06-29 ENCOUNTER — Other Ambulatory Visit: Payer: Self-pay

## 2014-06-29 ENCOUNTER — Telehealth: Payer: Self-pay

## 2014-06-29 MED ORDER — ALBUTEROL SULFATE HFA 108 (90 BASE) MCG/ACT IN AERS: 2.0000 | INHALATION_SPRAY | Freq: Four times a day (QID) | RESPIRATORY_TRACT | Status: AC | PRN

## 2014-06-29 NOTE — Telephone Encounter (Signed)
Received letter from [facility] regarding Pts Ventolin not being covered (formulary change), reviewed and per Dr. Larose Kells okay to change to Proair 2 puffs every 6 hours PRN, sent to pharmacy. Letter sent for scanning into chart.

## 2014-06-30 ENCOUNTER — Emergency Department (HOSPITAL_BASED_OUTPATIENT_CLINIC_OR_DEPARTMENT_OTHER): Payer: Medicare Other

## 2014-06-30 ENCOUNTER — Emergency Department (HOSPITAL_BASED_OUTPATIENT_CLINIC_OR_DEPARTMENT_OTHER)
Admission: EM | Admit: 2014-06-30 | Discharge: 2014-06-30 | Disposition: A | Discharge status: Home / Self Care | Payer: Medicare Other | Attending: Emergency Medicine | Admitting: Emergency Medicine

## 2014-06-30 ENCOUNTER — Encounter: Payer: Self-pay | Admitting: Internal Medicine

## 2014-06-30 ENCOUNTER — Ambulatory Visit (INDEPENDENT_AMBULATORY_CARE_PROVIDER_SITE_OTHER): Payer: Medicare Other | Admitting: Internal Medicine

## 2014-06-30 ENCOUNTER — Encounter (HOSPITAL_BASED_OUTPATIENT_CLINIC_OR_DEPARTMENT_OTHER): Payer: Self-pay | Admitting: Emergency Medicine

## 2014-06-30 VITALS — BP 130/77 | HR 87 | Temp 97.9°F | Ht 67.0 in | Wt 169.0 lb

## 2014-06-30 DIAGNOSIS — Z792 Long term (current) use of antibiotics: Secondary | ICD-10-CM | POA: Diagnosis not present

## 2014-06-30 DIAGNOSIS — E785 Hyperlipidemia, unspecified: Secondary | ICD-10-CM | POA: Insufficient documentation

## 2014-06-30 DIAGNOSIS — K589 Irritable bowel syndrome without diarrhea: Secondary | ICD-10-CM

## 2014-06-30 DIAGNOSIS — R002 Palpitations: Secondary | ICD-10-CM | POA: Insufficient documentation

## 2014-06-30 DIAGNOSIS — Z8673 Personal history of transient ischemic attack (TIA), and cerebral infarction without residual deficits: Secondary | ICD-10-CM | POA: Diagnosis not present

## 2014-06-30 DIAGNOSIS — Z8669 Personal history of other diseases of the nervous system and sense organs: Secondary | ICD-10-CM | POA: Diagnosis not present

## 2014-06-30 DIAGNOSIS — Z7901 Long term (current) use of anticoagulants: Secondary | ICD-10-CM | POA: Insufficient documentation

## 2014-06-30 DIAGNOSIS — Z880 Allergy status to penicillin: Secondary | ICD-10-CM | POA: Diagnosis not present

## 2014-06-30 DIAGNOSIS — I509 Heart failure, unspecified: Secondary | ICD-10-CM | POA: Insufficient documentation

## 2014-06-30 DIAGNOSIS — I482 Chronic atrial fibrillation, unspecified: Secondary | ICD-10-CM

## 2014-06-30 DIAGNOSIS — Z79899 Other long term (current) drug therapy: Secondary | ICD-10-CM | POA: Insufficient documentation

## 2014-06-30 DIAGNOSIS — R079 Chest pain, unspecified: Secondary | ICD-10-CM

## 2014-06-30 DIAGNOSIS — I10 Essential (primary) hypertension: Secondary | ICD-10-CM | POA: Diagnosis not present

## 2014-06-30 DIAGNOSIS — Z86010 Personal history of colonic polyps: Secondary | ICD-10-CM | POA: Diagnosis not present

## 2014-06-30 DIAGNOSIS — M797 Fibromyalgia: Secondary | ICD-10-CM | POA: Diagnosis not present

## 2014-06-30 DIAGNOSIS — Z8719 Personal history of other diseases of the digestive system: Secondary | ICD-10-CM | POA: Diagnosis not present

## 2014-06-30 DIAGNOSIS — Z85820 Personal history of malignant melanoma of skin: Secondary | ICD-10-CM | POA: Diagnosis not present

## 2014-06-30 DIAGNOSIS — R0602 Shortness of breath: Secondary | ICD-10-CM | POA: Diagnosis not present

## 2014-06-30 LAB — BASIC METABOLIC PANEL
ANION GAP: 4 — AB (ref 5–15)
BUN: 16 mg/dL (ref 6–23)
CO2: 27 mmol/L (ref 19–32)
Calcium: 8.8 mg/dL (ref 8.4–10.5)
Chloride: 106 mmol/L (ref 96–112)
Creatinine, Ser: 0.84 mg/dL (ref 0.50–1.10)
GFR, EST AFRICAN AMERICAN: 71 mL/min — AB (ref >90)
GFR, EST NON AFRICAN AMERICAN: 61 mL/min — AB (ref >90)
Glucose, Bld: 100 mg/dL — ABNORMAL HIGH (ref 70–99)
Potassium: 3.9 mmol/L (ref 3.5–5.1)
Sodium: 137 mmol/L (ref 135–145)

## 2014-06-30 LAB — CBC
HCT: 38.6 % (ref 36.0–46.0)
HEMOGLOBIN: 12.9 g/dL (ref 12.0–15.0)
MCH: 29.3 pg (ref 26.0–34.0)
MCHC: 33.4 g/dL (ref 30.0–36.0)
MCV: 87.5 fL (ref 78.0–100.0)
PLATELETS: 236 10*3/uL (ref 150–400)
RBC: 4.41 MIL/uL (ref 3.87–5.11)
RDW: 14.2 % (ref 11.5–15.5)
WBC: 5.8 10*3/uL (ref 4.0–10.5)

## 2014-06-30 LAB — BRAIN NATRIURETIC PEPTIDE: B NATRIURETIC PEPTIDE 5: 147.7 pg/mL — AB (ref 0.0–100.0)

## 2014-06-30 LAB — TROPONIN I: Troponin I: <0.03 ng/mL (ref <0.031)

## 2014-06-30 LAB — PROTIME-INR
INR: 2.98 — ABNORMAL HIGH (ref 0.00–1.49)
Prothrombin Time: 31 seconds — ABNORMAL HIGH (ref 11.6–15.2)

## 2014-06-30 NOTE — Progress Notes (Signed)
Subjective:    Patient ID: Virginia Price, female    DOB: 1925-06-26, 81 y.o.   MRN: [id number]  DOS:  06/30/2014 Type of visit - description : acute Interval history: Had an old appointment for today but decided to keep it because this morning she didn't feel well. Had one or 2 hours of chest pain, mild, 5/10, pain is now gone. Her heart was fluttering and she had some difficulty breathing. At this point the only remaining symptom is difficulty breathing. Chest pain did not radiate.  ROS Denies fever chills. No cough No lower extremity edema Last night slept well with no orthopnea No GERD or dysphagia.   Past Medical History  Diagnosis Date  . Atrial fibrillation     Coumadin therapy, dx via  Holter monitor, March, 2012, atrial fib heart rate ranges, no more bradycardia or tachycardia while on digoxin  . Chest pain     Catheterization normal 2004, /  nuclear 2006 no ischemia  . Hyperlipidemia   . Hypertension   . Stroke     (10-03 L MCA territory)  . Allergic rhinitis   . GERD (gastroesophageal reflux disease)   . Diverticular disease   . Colon polyp   . Abnormal chest CT     "innumerable small nodules  . Malignant melanoma   . Memory loss   . Restless leg syndrome   . CHF (congestive heart failure)     Diastolic with fluid overload, normal systolic function,, July, 2012.  Atrial fibrillation  . Mitral regurgitation     Mild to moderate, echo, January, 2012  . Erosive esophagitis   . Esophageal stricture   . Fibromyalgia     question of    Past Surgical History  Procedure Laterality Date  . Cholecystectomy  1991  . Radical hysterectomy  1978    hyst and B oophorectomy  . Cataract extraction      bilateral  . Appendectomy    . Mohs surgery      03/2005    History   Social History  . Marital Status: Widowed    Spouse Name: N/A    Number of Children: 3  . Years of Education: N/A   Occupational History  . retired     Social History Main Topics  .  Smoking status: Never Smoker   . Smokeless tobacco: Never Used  . Alcohol Use: No  . Drug Use: Not on file  . Sexual Activity: Not on file   Other Topics Concern  . Not on file   Social History Narrative   Lives by herself, 1 daughter in [place], 1 son in [place], 1 daughter in [place]-- still drives    Her neighbor very close to her Vaughan Basta)   Still drives short distances        Medication List       This list is accurate as of: 06/30/14 10:21 AM.  Always use your most recent med list.               albuterol 108 (90 BASE) MCG/ACT inhaler  Commonly known as:  PROVENTIL HFA;VENTOLIN HFA  Inhale 2 puffs into the lungs every 6 (six) hours as needed for wheezing or shortness of breath.     calcium-vitamin D 250-125 MG-UNIT per tablet  Commonly known as:  OSCAL  Take 1 tablet by mouth daily.     cephALEXin 250 MG capsule  Commonly known as:  KEFLEX  Take 250 mg by mouth daily.  fluticasone 50 MCG/ACT nasal spray  Commonly known as:  FLONASE  Place 2 sprays into the nose as needed for allergies or rhinitis.     furosemide 40 MG tablet  Commonly known as:  LASIX  Take 0.5 tablets (20 mg total) by mouth daily.     loratadine 10 MG tablet  Commonly known as:  CLARITIN  - Take 10 mg by mouth daily as needed. Allergies  -      metoprolol 50 MG tablet  Commonly known as:  LOPRESSOR  TAKE 1/2 TABLET BY MOUTH 2 TIMES DAILY     multivitamin per tablet  Take 1 tablet by mouth daily.     nitroGLYCERIN 0.4 MG/SPRAY spray  Commonly known as:  NITROLINGUAL  Place 1 spray under the tongue every 5 (five) minutes x 3 doses as needed. Chest pain     simvastatin 40 MG tablet  Commonly known as:  ZOCOR  Take 1 tablet (40 mg total) by mouth at bedtime.     warfarin 2 MG tablet  Commonly known as:  COUMADIN  TAKE AS DIRECTED           Objective:   Physical Exam  Constitutional: She is oriented to person, place, and time. She appears well-developed. No distress.    HENT:  Head: Normocephalic and atraumatic.  Neck: Neck supple. No JVD present.  Cardiovascular:  irreg  Pulmonary/Chest: Effort normal. No respiratory distress.  CTA B  Abdominal: Soft. Bowel sounds are normal. She exhibits no distension and no mass. There is no tenderness. There is no rebound and no guarding.  No organomegaly  Musculoskeletal: She exhibits no edema or tenderness.  Neurological: She is alert and oriented to person, place, and time. No cranial nerve deficit. She exhibits normal muscle tone. Coordination normal.  Speech normal, gait unassisted and normal for age, motor strength appropriate for age   Skin: Skin is warm and dry. No pallor.  No jaundice  Psychiatric: She has a normal mood and affect. Her behavior is normal. Judgment and thought content normal.  Vitals reviewed.        Assessment & Plan:   Chest pain, atrial fibrillation. 81 year old lady with permanent atrial fibrillation on Coumadin presents with a 1-2 hour history of chest pain this morning, pain resolved, she has remained shortness of breath. EKG showed atrial fibrillation, rate 89, at baseline. No evidence of CHF on clinical grounds. At this point, given the recent chest pain I think she needs further eval at emergency room. If she remains stable, cardiac enzymes are negative she could go home with close follow-up  Pt in agreement.  She is sent to the ER escorted and in a wheelchair.

## 2014-06-30 NOTE — ED Provider Notes (Signed)
CSN: [id number]     Arrival date & time 06/30/14  1017 History   First MD Initiated Contact with Patient 06/30/14 1106     Chief Complaint  Patient presents with  . Shortness of Breath     (Consider location/radiation/quality/duration/timing/severity/associated sxs/prior Treatment) HPI Comments: Patient is an 81 year old female with history of CHF, coronary artery disease, A. fib on Coumadin. She presents with complaints of "flip flopping" of her heart that started this morning when she woke from sleep. She denies to me that she is having any chest pain, but does feel somewhat short of breath. Her neighbor who is present at bedside believes her shortness of breath may be somewhat worse than her baseline. She was seen by her primary Dr. Jeris Penta in the [facility] clinic and sent down here for further evaluation of these complaints. She tells me she feels fine now. She is experiencing no discomfort  Patient is a 81 y.o. female presenting with shortness of breath. The history is provided by the patient.  Shortness of Breath Severity:  Mild Onset quality:  Sudden Duration:  4 hours Timing:  Constant Progression:  Resolved Chronicity:  New Context: activity   Relieved by:  Nothing Worsened by:  Nothing tried   Past Medical History  Diagnosis Date  . Atrial fibrillation     Coumadin therapy, dx via  Holter monitor, March, 2012, atrial fib heart rate ranges, no more bradycardia or tachycardia while on digoxin  . Chest pain     Catheterization normal 2004, /  nuclear 2006 no ischemia  . Hyperlipidemia   . Hypertension   . Stroke     (10-03 L MCA territory)  . Allergic rhinitis   . GERD (gastroesophageal reflux disease)   . Diverticular disease   . Colon polyp   . Abnormal chest CT     "innumerable small nodules  . Malignant melanoma   . Memory loss   . Restless leg syndrome   . CHF (congestive heart failure)     Diastolic with fluid overload, normal systolic function,, July, 2012.   Atrial fibrillation  . Mitral regurgitation     Mild to moderate, echo, January, 2012  . Erosive esophagitis   . Esophageal stricture   . Fibromyalgia     question of   Past Surgical History  Procedure Laterality Date  . Cholecystectomy  1991  . Radical hysterectomy  1978    hyst and B oophorectomy  . Cataract extraction      bilateral  . Appendectomy    . Mohs surgery      03/2005   Family History  Problem Relation Age of Onset  . Heart disease Mother   . Colon polyps Mother   . Diabetes Mother   . Heart disease Father   . Diabetes Father    History  Substance Use Topics  . Smoking status: Never Smoker   . Smokeless tobacco: Never Used  . Alcohol Use: No   OB History    No data available     Review of Systems  Respiratory: Positive for shortness of breath.   All other systems reviewed and are negative.     Allergies  Ciprofloxacin; Penicillins; Sulfamethoxazole-trimethoprim; Sulfonamide derivatives; [name]; Hydrocodone-acetaminophen; Metronidazole; Prednisone; and Tramadol hcl  Home Medications   Prior to Admission medications   Medication Sig Start Date End Date Taking? Authorizing Provider  albuterol (PROVENTIL HFA;VENTOLIN HFA) 108 (90 BASE) MCG/ACT inhaler Inhale 2 puffs into the lungs every 6 (six) hours as needed for  wheezing or shortness of breath. 06/29/14   Colon Branch, MD  calcium-vitamin D (OSCAL) 250-125 MG-UNIT per tablet Take 1 tablet by mouth daily.      Historical Provider, MD  cephALEXin (KEFLEX) 250 MG capsule Take 250 mg by mouth daily.    Historical Provider, MD  fluticasone (FLONASE) 50 MCG/ACT nasal spray Place 2 sprays into the nose as needed for allergies or rhinitis.  09/25/12   Colon Branch, MD  furosemide (LASIX) 40 MG tablet Take 0.5 tablets (20 mg total) by mouth daily.    Carlena Bjornstad, MD  loratadine (CLARITIN) 10 MG tablet Take 10 mg by mouth daily as needed. Allergies     Historical Provider, MD  metoprolol (LOPRESSOR) 50 MG  tablet TAKE 1/2 TABLET BY MOUTH 2 TIMES DAILY 06/12/14   Colon Branch, MD  multivitamin [facility]) per tablet Take 1 tablet by mouth daily.      Historical Provider, MD  nitroGLYCERIN (NITROLINGUAL) 0.4 MG/SPRAY spray Place 1 spray under the tongue every 5 (five) minutes x 3 doses as needed. Chest pain    Historical Provider, MD  simvastatin (ZOCOR) 40 MG tablet Take 1 tablet (40 mg total) by mouth at bedtime. 06/12/14   Colon Branch, MD  warfarin (COUMADIN) 2 MG tablet TAKE AS DIRECTED 06/12/14   Colon Branch, MD   BP 136/78 mmHg  Pulse 90  Temp(Src) 98.2 F (36.8 C) (Oral)  Resp 16  Ht 5\' 7"  (1.702 m)  Wt 169 lb (76.658 kg)  BMI 26.46 kg/m2  SpO2 95% Physical Exam  Constitutional: She is oriented to person, place, and time. She appears well-developed and well-nourished. No distress.  HENT:  Head: Normocephalic and atraumatic.  Neck: Normal range of motion. Neck supple.  Cardiovascular: Normal rate.  Exam reveals no gallop and no friction rub.   No murmur heard. Heart is irregularly irregular  Pulmonary/Chest: Effort normal and breath sounds normal. No respiratory distress. She has no wheezes.  Abdominal: Soft. Bowel sounds are normal. She exhibits no distension. There is no tenderness.  Musculoskeletal: Normal range of motion.  Neurological: She is alert and oriented to person, place, and time.  Skin: Skin is warm and dry. She is not diaphoretic.  Nursing note and vitals reviewed.   ED Course  Procedures (including critical care time) Labs Review Labs Reviewed  CBC  BASIC METABOLIC PANEL  BRAIN NATRIURETIC PEPTIDE  TROPONIN I  PROTIME-INR    Imaging Review No results found.   EKG Interpretation   Date/Time:  Tuesday June 30 2014 10:24:22 EST Ventricular Rate:  85 PR Interval:    QRS Duration: 74 QT Interval:  384 QTC Calculation: 456 R Axis:   72 Text Interpretation:  Atrial fibrillation Abnormal ECG Confirmed by DELOS   MD, Cieara Stierwalt ([id number]) on 06/30/2014 11:29:26  AM      MDM   Final diagnoses:  None    Patient is an 81 year old female sent to the emergency department by her primary care doctor for further evaluation of palpitations that started this morning shortly after waking from sleep. She has a history of chronic atrial fibrillation and this feels similar to what she normally experiences, only slightly worse. She felt somewhat tight in the chest initially but denies any pain at present.  Her workup reveals atrial fibrillation with controlled rate on her EKG and laboratory studies are not reflective of CHF or an acute cardiac event. She is feeling better and is insistent upon returning home. I had  a lengthy discussion with her regarding the implications of her test results. She understands that these negative results have not completely ruled out all life-threatening pathology and that there are some risks associated with going home. She understands this and chooses to accept them. I attempted to call her primary Dr., Dr. Larose Kells, however he was not available and the patient did not want to wait around longer. She was then discharged to home.    Veryl Speak, MD 06/30/14 [phone]

## 2014-06-30 NOTE — ED Notes (Signed)
Pt complaining about wait. Pt has taken self off of monitor and taken electrodes off. Talked with pt about the wait. Informed MD about pt complaints.

## 2014-06-30 NOTE — ED Notes (Signed)
Pt sent down from dr Larose Kells.  Pt having sob with intermittent palpitations this am.  Pt has history of a fib.  Denies pain currently.

## 2014-06-30 NOTE — Assessment & Plan Note (Signed)
Today she reports occasional postprandial diarrhea, in the past Bentyl helped. Recommend to be assessed for chest pain at the ER first, will discuss bentyl in the near future

## 2014-06-30 NOTE — Progress Notes (Signed)
Pre visit review using our clinic review tool, if applicable. No additional management support is needed unless otherwise documented below in the visit note. 

## 2014-06-30 NOTE — Discharge Instructions (Signed)
Return to the emergency department if you experience any chest pain, difficulty breathing, or other new and concerning symptoms.   Palpitations A palpitation is the feeling that your heartbeat is irregular or is faster than normal. It may feel like your heart is fluttering or skipping a beat. Palpitations are usually not a serious problem. However, in some cases, you may need further medical evaluation. CAUSES  Palpitations can be caused by:  Smoking.  Caffeine or other stimulants, such as diet pills or energy drinks.  Alcohol.  Stress and anxiety.  Strenuous physical activity.  Fatigue.  Certain medicines.  Heart disease, especially if you have a history of irregular heart rhythms (arrhythmias), such as atrial fibrillation, atrial flutter, or supraventricular tachycardia.  An improperly working pacemaker or defibrillator. DIAGNOSIS  To find the cause of your palpitations, your health care provider will take your medical history and perform a physical exam. Your health care provider may also have you take a test called an ambulatory electrocardiogram (ECG). An ECG records your heartbeat patterns over a 24-hour period. You may also have other tests, such as:  Transthoracic echocardiogram (TTE). During echocardiography, sound waves are used to evaluate how blood flows through your heart.  Transesophageal echocardiogram (TEE).  Cardiac monitoring. This allows your health care provider to monitor your heart rate and rhythm in real time.  Holter monitor. This is a portable device that records your heartbeat and can help diagnose heart arrhythmias. It allows your health care provider to track your heart activity for several days, if needed.  Stress tests by exercise or by giving medicine that makes the heart beat faster. TREATMENT  Treatment of palpitations depends on the cause of your symptoms and can vary greatly. Most cases of palpitations do not require any treatment other than  time, relaxation, and monitoring your symptoms. Other causes, such as atrial fibrillation, atrial flutter, or supraventricular tachycardia, usually require further treatment. HOME CARE INSTRUCTIONS   Avoid:  Caffeinated coffee, tea, soft drinks, diet pills, and energy drinks.  Chocolate.  Alcohol.  Stop smoking if you smoke.  Reduce your stress and anxiety. Things that can help you relax include:  A method of controlling things in your body, such as your heartbeats, with your mind (biofeedback).  Yoga.  Meditation.  Physical activity such as swimming, jogging, or walking.  Get plenty of rest and sleep. SEEK MEDICAL CARE IF:   You continue to have a fast or irregular heartbeat beyond 24 hours.  Your palpitations occur more often. SEEK IMMEDIATE MEDICAL CARE IF:  You have chest pain or shortness of breath.  You have a severe headache.  You feel dizzy or you faint. MAKE SURE YOU:  Understand these instructions.  Will watch your condition.  Will get help right away if you are not doing well or get worse. Document Released: 05/12/2000 Document Revised: 05/20/2013 Document Reviewed: 07/14/2011 [facility] Patient Information 2015 [place], [place]. This information is not intended to replace advice given to you by your health care provider. Make sure you discuss any questions you have with your health care provider.  Atrial Fibrillation Atrial fibrillation is a type of irregular heart rhythm (arrhythmia). During atrial fibrillation, the upper chambers of the heart (atria) quiver continuously in a chaotic pattern. This causes an irregular and often rapid heart rate.  Atrial fibrillation is the result of the heart becoming overloaded with disorganized signals that tell it to beat. These signals are normally released one at a time by a part of the right atrium  called the sinoatrial node. They then travel from the atria to the lower chambers of the heart (ventricles), causing the  atria and ventricles to contract and pump blood as they pass. In atrial fibrillation, parts of the atria outside of the sinoatrial node also release these signals. This results in two problems. First, the atria receive so many signals that they do not have time to fully contract. Second, the ventricles, which can only receive one signal at a time, beat irregularly and out of rhythm with the atria.  There are three types of atrial fibrillation:   Paroxysmal. Paroxysmal atrial fibrillation starts suddenly and stops on its own within a week.  Persistent. Persistent atrial fibrillation lasts for more than a week. It may stop on its own or with treatment.  Permanent. Permanent atrial fibrillation does not go away. Episodes of atrial fibrillation may lead to permanent atrial fibrillation. Atrial fibrillation can prevent your heart from pumping blood normally. It increases your risk of stroke and can lead to heart failure.  CAUSES   Heart conditions, including a heart attack, heart failure, coronary artery disease, and heart valve conditions.   Inflammation of the sac that surrounds the heart (pericarditis).  Blockage of an artery in the lungs (pulmonary embolism).  Pneumonia or other infections.  Chronic lung disease.  Thyroid problems, especially if the thyroid is overactive (hyperthyroidism).  Caffeine, excessive alcohol use, and use of some illegal drugs.   Use of some medicines, including certain decongestants and diet pills.  Heart surgery.   Birth defects.  Sometimes, no cause can be found. When this happens, the atrial fibrillation is called lone atrial fibrillation. The risk of complications from atrial fibrillation increases if you have lone atrial fibrillation and you are age 55 years or older. RISK FACTORS  Heart failure.  Coronary artery disease.  Diabetes mellitus.   High blood pressure (hypertension).   Obesity.   Other arrhythmias.   Increased age. SIGNS  AND SYMPTOMS   A feeling that your heart is beating rapidly or irregularly.   A feeling of discomfort or pain in your chest.   Shortness of breath.   Sudden light-headedness or weakness.   Getting tired easily when exercising.   Urinating more often than normal (mainly when atrial fibrillation first begins).  In paroxysmal atrial fibrillation, symptoms may start and suddenly stop. DIAGNOSIS  Your health care provider may be able to detect atrial fibrillation when taking your pulse. Your health care provider may have you take a test called an ambulatory electrocardiogram (ECG). An ECG records your heartbeat patterns over a 24-hour period. You may also have other tests, such as:  Transthoracic echocardiogram (TTE). During echocardiography, sound waves are used to evaluate how blood flows through your heart.  Transesophageal echocardiogram (TEE).  Stress test. There is more than one type of stress test. If a stress test is needed, ask your health care provider about which type is best for you.  Chest X-ray exam.  Blood tests.  Computed tomography (CT). TREATMENT  Treatment may include:  Treating any underlying conditions. For example, if you have an overactive thyroid, treating the condition may correct atrial fibrillation.  Taking medicine. Medicines may be given to control a rapid heart rate or to prevent blood clots, heart failure, or a stroke.  Having a procedure to correct the rhythm of the heart:  Electrical cardioversion. During electrical cardioversion, a controlled, low-energy shock is delivered to the heart through your skin. If you have chest pain, very low blood  pressure, or sudden heart failure, this procedure may need to be done as an emergency.  Catheter ablation. During this procedure, heart tissues that send the signals that cause atrial fibrillation are destroyed.  Surgical ablation. During this surgery, thin lines of heart tissue that carry the abnormal  signals are destroyed. This procedure can either be an open-heart surgery or a minimally invasive surgery. With the minimally invasive surgery, small cuts are made to access the heart instead of a large opening.  Pulmonary venous isolation. During this surgery, tissue around the veins that carry blood from the lungs (pulmonary veins) is destroyed. This tissue is thought to carry the abnormal signals. HOME CARE INSTRUCTIONS   Take medicines only as directed by your health care provider. Some medicines can make atrial fibrillation worse or recur.  If blood thinners were prescribed by your health care provider, take them exactly as directed. Too much blood-thinning medicine can cause bleeding. If you take too little, you will not have the needed protection against stroke and other problems.  Perform blood tests at home if directed by your health care provider. Perform blood tests exactly as directed.  Quit smoking if you smoke.  Do not drink alcohol.  Do not drink caffeinated beverages such as coffee, soda, and some teas. You may drink decaffeinated coffee, soda, or tea.   Maintain a healthy weight.Do not use diet pills unless your health care provider approves. They may make heart problems worse.   Follow diet instructions as directed by your health care provider.  Exercise regularly as directed by your health care provider.  Keep all follow-up visits as directed by your health care provider. This is important. PREVENTION  The following substances can cause atrial fibrillation to recur:   Caffeinated beverages.  Alcohol.  Certain medicines, especially those used for breathing problems.  Certain herbs and herbal medicines, such as those containing ephedra or ginseng.  Illegal drugs, such as cocaine and amphetamines. Sometimes medicines are given to prevent atrial fibrillation from recurring. Proper treatment of any underlying condition is also important in helping prevent  recurrence.  SEEK MEDICAL CARE IF:  You notice a change in the rate, rhythm, or strength of your heartbeat.  You suddenly begin urinating more frequently.  You tire more easily when exerting yourself or exercising. SEEK IMMEDIATE MEDICAL CARE IF:   You have chest pain, abdominal pain, sweating, or weakness.  You feel nauseous.  You have shortness of breath.  You suddenly have swollen feet and ankles.  You feel dizzy.  Your face or limbs feel numb or weak.  You have a change in your vision or speech. MAKE SURE YOU:   Understand these instructions.  Will watch your condition.  Will get help right away if you are not doing well or get worse. Document Released: 05/15/2005 Document Revised: 09/29/2013 Document Reviewed: 06/25/2012 [facility] Patient Information 2015 [place], [place]. This information is not intended to replace advice given to you by your health care provider. Make sure you discuss any questions you have with your health care provider.

## 2014-06-30 NOTE — Patient Instructions (Signed)
Please go to the ER downstairs for further evaluation. Call me in the next few days and let me know how you are doing.

## 2014-07-01 ENCOUNTER — Telehealth: Payer: Self-pay

## 2014-07-01 NOTE — Telephone Encounter (Signed)
Workup at the ER was negative, recommend observation, schedule a visit 2 weeks from now, if symptoms increase or are not gradually improving please let me know

## 2014-07-01 NOTE — Telephone Encounter (Signed)
Called patient who stated that shortness of breath was much better.  She picked up her albuterol inhaler at the pharmacy this morning.  She denies any lightheadedness, chest pain, or palpitations.  She states she is feeling better with energy.  She did not sound short of breath on the phone.  Follow-up appointment scheduled for 07/15/14 per patient preference.  eal

## 2014-07-01 NOTE — Telephone Encounter (Signed)
Please advise Pt

## 2014-07-01 NOTE — Telephone Encounter (Signed)
Spoke with Pt, she informed me she is no longer having chest pains, but still having shortness of breath, Pt denies having palpitations or "flopping" sensation that she was having yesterday. Pt informed me that ED did not give her anything for shortness of breath. Informed Pt I would speak with Dr. Larose Kells and call her back. Informed Pt if shortness of breath gets worse or if she begins having chest pain she will need to go back to the ED. Pt verbalized understanding.

## 2014-07-01 NOTE — Telephone Encounter (Signed)
Also advised patient to go to ED if she experienced any shortness of breath, chest pain, or dizziness.  Patient stated understanding and agreed.

## 2014-07-15 ENCOUNTER — Encounter: Payer: Self-pay | Admitting: Internal Medicine

## 2014-07-15 ENCOUNTER — Ambulatory Visit (INDEPENDENT_AMBULATORY_CARE_PROVIDER_SITE_OTHER): Payer: Medicare Other | Admitting: Internal Medicine

## 2014-07-15 VITALS — BP 122/64 | HR 86 | Temp 98.2°F | Ht 67.0 in | Wt 167.5 lb

## 2014-07-15 DIAGNOSIS — I4891 Unspecified atrial fibrillation: Secondary | ICD-10-CM | POA: Diagnosis not present

## 2014-07-15 DIAGNOSIS — I482 Chronic atrial fibrillation, unspecified: Secondary | ICD-10-CM

## 2014-07-15 DIAGNOSIS — K589 Irritable bowel syndrome without diarrhea: Secondary | ICD-10-CM | POA: Diagnosis not present

## 2014-07-15 LAB — POCT INR: INR: 2

## 2014-07-15 NOTE — Progress Notes (Signed)
Subjective:    Patient ID: Virginia Price, female    DOB: 20-Mar-1926, 81 y.o.   MRN: [id number]  DOS:  07/15/2014 Type of visit - description : Follow-up Interval history: Was seen here with palpitations and sent to the  ER 06/30/2014  CBC, BMP, troponin were normal/negative INR 2.9 BMP 147, slightly elevated. Here for follow-up.   Review of Systems Since the ER visit she is doing well. She has occasional palpitations but at this time the frequency and intensity of the symptoms are at  baseline. No further chest pain. No problems with difficulty breathing or lower extremity edema. She also mentioned diarrhea the last time she was here, symptoms are essentially gone. No nausea vomiting.  Past Medical History  Diagnosis Date  . Atrial fibrillation     Coumadin therapy, dx via  Holter monitor, March, 2012, atrial fib heart rate ranges, no more bradycardia or tachycardia while on digoxin  . Chest pain     Catheterization normal 2004, /  nuclear 2006 no ischemia  . Hyperlipidemia   . Hypertension   . Stroke     (10-03 L MCA territory)  . Allergic rhinitis   . GERD (gastroesophageal reflux disease)   . Diverticular disease   . Colon polyp   . Abnormal chest CT     "innumerable small nodules  . Malignant melanoma   . Memory loss   . Restless leg syndrome   . CHF (congestive heart failure)     Diastolic with fluid overload, normal systolic function,, July, 2012.  Atrial fibrillation  . Mitral regurgitation     Mild to moderate, echo, January, 2012  . Erosive esophagitis   . Esophageal stricture   . Fibromyalgia     question of    Past Surgical History  Procedure Laterality Date  . Cholecystectomy  1991  . Radical hysterectomy  1978    hyst and B oophorectomy  . Cataract extraction      bilateral  . Appendectomy    . Mohs surgery      03/2005    History   Social History  . Marital Status: Widowed    Spouse Name: N/A  . Number of Children: 3  . Years of  Education: N/A   Occupational History  . retired     Social History Main Topics  . Smoking status: Never Smoker   . Smokeless tobacco: Never Used  . Alcohol Use: No  . Drug Use: Not on file  . Sexual Activity: Not on file   Other Topics Concern  . Not on file   Social History Narrative   Lives by herself, 1 daughter in [place], 1 son in [place], 1 daughter in [place]-- still drives    Her neighbor very close to her Vaughan Basta)   Still drives short distances        Medication List       This list is accurate as of: 07/15/14  5:59 PM.  Always use your most recent med list.               albuterol 108 (90 BASE) MCG/ACT inhaler  Commonly known as:  PROVENTIL HFA;VENTOLIN HFA  Inhale 2 puffs into the lungs every 6 (six) hours as needed for wheezing or shortness of breath.     calcium-vitamin D 250-125 MG-UNIT per tablet  Commonly known as:  OSCAL  Take 1 tablet by mouth daily.     cephALEXin 250 MG capsule  Commonly known as:  KEFLEX  Take 250 mg by mouth daily.     fluticasone 50 MCG/ACT nasal spray  Commonly known as:  FLONASE  Place 2 sprays into the nose as needed for allergies or rhinitis.     furosemide 40 MG tablet  Commonly known as:  LASIX  Take 0.5 tablets (20 mg total) by mouth daily.     loratadine 10 MG tablet  Commonly known as:  CLARITIN  - Take 10 mg by mouth daily as needed. Allergies  -      metoprolol 50 MG tablet  Commonly known as:  LOPRESSOR  TAKE 1/2 TABLET BY MOUTH 2 TIMES DAILY     multivitamin per tablet  Take 1 tablet by mouth daily.     nitroGLYCERIN 0.4 MG/SPRAY spray  Commonly known as:  NITROLINGUAL  Place 1 spray under the tongue every 5 (five) minutes x 3 doses as needed. Chest pain     simvastatin 40 MG tablet  Commonly known as:  ZOCOR  Take 1 tablet (40 mg total) by mouth at bedtime.     warfarin 2 MG tablet  Commonly known as:  COUMADIN  TAKE AS DIRECTED           Objective:   Physical Exam BP 122/64 mmHg   Pulse 86  Temp(Src) 98.2 F (36.8 C) (Oral)  Ht 5\' 7"  (1.702 m)  Wt 167 lb 8 oz (75.978 kg)  BMI 26.23 kg/m2  SpO2 95%  General:   Well developed, well nourished . NAD.  HEENT:  Normocephalic . Face symmetric, atraumatic Lungs:  CTA B Normal respiratory effort, no intercostal retractions, no accessory muscle use. Heart: irreg,  no murmur.  Muscle skeletal: no pretibial edema bilaterally  Skin: Not pale. Not jaundice Neurologic:  alert & oriented X3.  Speech normal, gait appropriate for age and unassisted Psych--  Cognition and judgment appear intact.  Cooperative with normal attention span and concentration.  Behavior appropriate. No anxious or depressed appearing.       Assessment & Plan:

## 2014-07-15 NOTE — Progress Notes (Signed)
Pre visit review using our clinic review tool, if applicable. No additional management support is needed unless otherwise documented below in the visit note. 

## 2014-07-15 NOTE — Patient Instructions (Signed)
Continue taking the same Coumadin dose until you hear from the nurses at [facility]  Next visit here in 3-4 months for a physical exam, fasting.

## 2014-07-15 NOTE — Assessment & Plan Note (Signed)
On the last visit she mentioned occasional diarrhea and possibly needing a prescription for Bentyl, today states she is asymptomatic.

## 2014-07-15 NOTE — Assessment & Plan Note (Signed)
Palpitations are now back to baseline, no chest pain. Recommend continue with present care. INR today 2.0, will notify the nurses at [facility] She takes Coumadin 2 mg one tablet daily

## 2014-07-16 ENCOUNTER — Telehealth: Payer: Self-pay | Admitting: Internal Medicine

## 2014-07-16 NOTE — Telephone Encounter (Signed)
Spoke with Daughter, Lucilla Edin to clarify patient's appointment time.

## 2014-07-16 NOTE — Telephone Encounter (Signed)
Pt's daughter request phone call from [place], her mother does not understand what yall just talk about.

## 2014-07-20 ENCOUNTER — Other Ambulatory Visit: Payer: Self-pay | Admitting: Internal Medicine

## 2014-07-21 ENCOUNTER — Ambulatory Visit (INDEPENDENT_AMBULATORY_CARE_PROVIDER_SITE_OTHER): Payer: Medicare Other | Admitting: General Practice

## 2014-07-21 DIAGNOSIS — Z5181 Encounter for therapeutic drug level monitoring: Secondary | ICD-10-CM

## 2014-07-21 LAB — POCT INR: INR: 2.6

## 2014-07-21 NOTE — Progress Notes (Signed)
Pre visit review using our clinic review tool, if applicable. No additional management support is needed unless otherwise documented below in the visit note. 

## 2014-07-21 NOTE — Progress Notes (Signed)
Agree with plan 

## 2014-08-18 ENCOUNTER — Ambulatory Visit (INDEPENDENT_AMBULATORY_CARE_PROVIDER_SITE_OTHER): Payer: Medicare Other | Admitting: General Practice

## 2014-08-18 DIAGNOSIS — Z5181 Encounter for therapeutic drug level monitoring: Secondary | ICD-10-CM | POA: Diagnosis not present

## 2014-08-18 LAB — POCT INR: INR: 2.9

## 2014-08-18 NOTE — Progress Notes (Signed)
Pre visit review using our clinic review tool, if applicable. No additional management support is needed unless otherwise documented below in the visit note. 

## 2014-08-18 NOTE — Progress Notes (Signed)
Agree with plan 

## 2014-09-15 ENCOUNTER — Ambulatory Visit (INDEPENDENT_AMBULATORY_CARE_PROVIDER_SITE_OTHER): Payer: Medicare Other | Admitting: General Practice

## 2014-09-15 DIAGNOSIS — Z5181 Encounter for therapeutic drug level monitoring: Secondary | ICD-10-CM | POA: Diagnosis not present

## 2014-09-15 LAB — POCT INR: INR: 3.4

## 2014-09-15 NOTE — Progress Notes (Signed)
Agree with plan 

## 2014-09-15 NOTE — Progress Notes (Signed)
Pre visit review using our clinic review tool, if applicable. No additional management support is needed unless otherwise documented below in the visit note. 

## 2014-09-23 ENCOUNTER — Other Ambulatory Visit: Payer: Self-pay | Admitting: Internal Medicine

## 2014-09-24 ENCOUNTER — Telehealth: Payer: Self-pay | Admitting: Internal Medicine

## 2014-09-24 NOTE — Telephone Encounter (Signed)
Pre visit letter for annual exam mailed

## 2014-09-30 ENCOUNTER — Encounter: Payer: Self-pay | Admitting: Cardiology

## 2014-09-30 ENCOUNTER — Ambulatory Visit (INDEPENDENT_AMBULATORY_CARE_PROVIDER_SITE_OTHER): Payer: Medicare Other | Admitting: Cardiology

## 2014-09-30 VITALS — BP 130/80 | HR 87 | Ht 67.0 in | Wt 164.4 lb

## 2014-09-30 DIAGNOSIS — I340 Nonrheumatic mitral (valve) insufficiency: Secondary | ICD-10-CM

## 2014-09-30 DIAGNOSIS — I482 Chronic atrial fibrillation, unspecified: Secondary | ICD-10-CM

## 2014-09-30 DIAGNOSIS — R072 Precordial pain: Secondary | ICD-10-CM

## 2014-09-30 DIAGNOSIS — Z7901 Long term (current) use of anticoagulants: Secondary | ICD-10-CM

## 2014-09-30 NOTE — Assessment & Plan Note (Signed)
Patient has chronic atrial fibrillation. Rate is controlled. No further workup. She is anticoagulated.

## 2014-09-30 NOTE — Assessment & Plan Note (Signed)
She continues on Coumadin for her atrial fib. No change in therapy.

## 2014-09-30 NOTE — Assessment & Plan Note (Signed)
Patient had an episode of CHF when she had atrial fib in the past. Her volume status is stable.  no further workup.

## 2014-09-30 NOTE — Progress Notes (Signed)
Cardiology Office Note   Date:  09/30/2014   ID:  Virginia Price, DOB [birth year], MRN [id number]  PCP:  Kathlene November, MD  Cardiologist:  Dola Argyle, MD   Chief Complaint  Patient presents with  . Appointment    Follow-up shortness of breath      History of Present Illness: Virginia Price is a 81 y.o. female who presents today to follow up atrial fibrillation. She actually is doing very well. She looks quite good for 81 years of age. She is limited primarily by arthritis in her right leg. She's not having any chest pain. She has no significant shortness of breath. Catheterization in the past showed no significant coronary disease.    Past Medical History  Diagnosis Date  . Atrial fibrillation     Coumadin therapy, dx via  Holter monitor, March, 2012, atrial fib heart rate ranges, no more bradycardia or tachycardia while on digoxin  . Chest pain     Catheterization normal 2004, /  nuclear 2006 no ischemia  . Hyperlipidemia   . Hypertension   . Stroke     (10-03 L MCA territory)  . Allergic rhinitis   . GERD (gastroesophageal reflux disease)   . Diverticular disease   . Colon polyp   . Abnormal chest CT     "innumerable small nodules  . Malignant melanoma   . Memory loss   . Restless leg syndrome   . CHF (congestive heart failure)     Diastolic with fluid overload, normal systolic function,, July, 2012.  Atrial fibrillation  . Mitral regurgitation     Mild to moderate, echo, January, 2012  . Erosive esophagitis   . Esophageal stricture   . Fibromyalgia     question of    Past Surgical History  Procedure Laterality Date  . Cholecystectomy  1991  . Radical hysterectomy  1978    hyst and B oophorectomy  . Cataract extraction      bilateral  . Appendectomy    . Mohs surgery      03/2005    Patient Active Problem List   Diagnosis Date Noted  . Mitral regurgitation     Priority: High  . Chronic atrial fibrillation     Priority: High  . CHF (congestive heart  failure) 12/12/2010    Priority: High  . Chest pain     Priority: High  . DJD (degenerative joint disease) 06/14/2014  . Lung nodule 10/01/2013  . Aortic insufficiency 09/05/2013  . Aortic valve sclerosis 09/05/2013  . Shortness of breath 08/20/2013  . Encounter for therapeutic drug monitoring 08/06/2013  . Warfarin anticoagulation 02/04/2012  . Dyspepsia 11/03/2011  . Wheezing 11/03/2011  . Annual physical exam 09/10/2011  . Ejection fraction <   . Failure to thrive in childhood 03/03/2011  . Recurrent UTI 02/28/2011  . GERD (gastroesophageal reflux disease)   . Abnormal chest CT   . DIZZINESS 06/02/2010  . IBS 05/06/2009  . BACK PAIN 08/10/2008  . MALIGNANT MELANOMA, SKIN 11/15/2007  . CARCINOMA, BASAL CELL, HX OF 11/15/2007  . PERSONAL HX COLONIC POLYPS 11/15/2007  . OVERACTIVE BLADDER 06/13/2007  . MEMORY LOSS 06/13/2007  . ALLERGIC RHINITIS 03/16/2007  . GERD 03/16/2007  . RESTLESS LEG SYNDROME 01/30/2007  . Hyperlipidemia 06/02/2006  . Essential hypertension 06/02/2006      Current Outpatient Prescriptions  Medication Sig Dispense Refill  . albuterol (PROVENTIL HFA;VENTOLIN HFA) 108 (90 BASE) MCG/ACT inhaler Inhale 2 puffs into the lungs every 6 (six)  hours as needed for wheezing or shortness of breath. 1 Inhaler 2  . calcium-vitamin D (OSCAL) 250-125 MG-UNIT per tablet Take 1 tablet by mouth daily.      . cephALEXin (KEFLEX) 250 MG capsule Take 250 mg by mouth daily.    . fluticasone (FLONASE) 50 MCG/ACT nasal spray Place 2 sprays into the nose as needed for allergies or rhinitis.     . furosemide (LASIX) 40 MG tablet Take 0.5 tablets (20 mg total) by mouth daily. 30 tablet 6  . metoprolol (LOPRESSOR) 50 MG tablet TAKE 1/2 TABLET BY MOUTH 2 TIMES DAILY 90 tablet 2  . multivitamin (THERAGRAN) per tablet Take 1 tablet by mouth daily.      . nitroGLYCERIN (NITROLINGUAL) 0.4 MG/SPRAY spray Place 1 spray under the tongue every 5 (five) minutes x 3 doses as needed. Chest  pain    . simvastatin (ZOCOR) 40 MG tablet Take 1 tablet (40 mg total) by mouth at bedtime. 90 tablet 2  . warfarin (COUMADIN) 2 MG tablet TAKE AS DIRECTED 30 tablet 3  . [DISCONTINUED] potassium chloride (K-DUR) 10 MEQ tablet Take 1 tablet (10 mEq total) by mouth 2 (two) times daily. 30 tablet 1   No current facility-administered medications for this visit.    Allergies:   Ciprofloxacin; Penicillins; Sulfamethoxazole-trimethoprim; Sulfonamide derivatives; [name]; Hydrocodone-acetaminophen; Metronidazole; Prednisone; and Tramadol hcl    Social History:  The patient  reports that she has never smoked. She has never used smokeless tobacco. She reports that she does not drink alcohol.   Family History:  The patient's family history includes Colon polyps in her mother; Diabetes in her father and mother; Heart disease in her father and mother.    ROS:  Please see the history of present illness.     Patient denies fever, chills, headache, sweats, rash, change in vision, change in hearing, chest pain, cough, nausea or vomiting, urinary symptoms. All other systems are reviewed and are negative.   PHYSICAL EXAM: VS:  BP 130/80 mmHg  Pulse 87  Ht 5\' 7"  (1.702 m)  Wt 164 lb 6.4 oz (74.571 kg)  BMI 25.74 kg/m2 , Patient is stable. She is here with a family member. She is oriented to person time and place. Affect is normal. Head is atraumatic. Sclera and conjunctiva were normal. There is no jugular venous distention. Lungs are clear. Respiratory effort is not labored. Cardiac exam reveals an S1 and S2. Abdomen is soft. There is no peripheral edema. There are no musculoskeletal form of days. There are no skin rashes. She walks with a cane. The rhythm is irregularly irregular. The rate is controlled.  EKG:   EKG is not done today.   Recent Labs: 06/12/2014: ALT 16 06/30/2014: B Natriuretic Peptide 147.7*; BUN 16; Creatinine 0.84; Hemoglobin 12.9; Platelets 236; Potassium 3.9; Sodium 137    Lipid  Panel    Component Value Date/Time   CHOL 226* 06/12/2014 1133   TRIG 221.0* 06/12/2014 1133   HDL 52.60 06/12/2014 1133   CHOLHDL 4 06/12/2014 1133   VLDL 44.2* 06/12/2014 1133   LDLCALC 96 07/21/2013 1411   LDLDIRECT 144.0 06/12/2014 1133      Wt Readings from Last 3 Encounters:  09/30/14 164 lb 6.4 oz (74.571 kg)  07/15/14 167 lb 8 oz (75.978 kg)  06/30/14 169 lb (76.658 kg)      Current medicines are reviewed  The patient understands her medications.     ASSESSMENT AND PLAN:

## 2014-09-30 NOTE — Assessment & Plan Note (Signed)
The patient has rare fleeting chest pain. It does not sound significant. We know from the past that her coronary arteries were normal in 2004. No further workup.

## 2014-09-30 NOTE — Assessment & Plan Note (Signed)
She has mild valvular heart disease. Echo was done April, 2015. No further workup needed.

## 2014-09-30 NOTE — Patient Instructions (Signed)
**Note De-Identified  Obfuscation** Medication Instructions:  Same  Labwork: None  Testing/Procedures: None  Follow-Up: Your physician wants you to follow-up in: 7 months. You will receive a reminder letter in the mail two months in advance. If you don't receive a letter, please call our office to schedule the follow-up appointment.

## 2014-10-05 DIAGNOSIS — M1712 Unilateral primary osteoarthritis, left knee: Secondary | ICD-10-CM | POA: Diagnosis not present

## 2014-10-13 ENCOUNTER — Ambulatory Visit (INDEPENDENT_AMBULATORY_CARE_PROVIDER_SITE_OTHER): Payer: Medicare Other | Admitting: General Practice

## 2014-10-13 DIAGNOSIS — Z5181 Encounter for therapeutic drug level monitoring: Secondary | ICD-10-CM | POA: Diagnosis not present

## 2014-10-13 LAB — POCT INR: INR: 3.4

## 2014-10-13 NOTE — Progress Notes (Signed)
Pre visit review using our clinic review tool, if applicable. No additional management support is needed unless otherwise documented below in the visit note. 

## 2014-10-13 NOTE — Progress Notes (Signed)
Agree with plan 

## 2014-10-14 ENCOUNTER — Encounter: Payer: Self-pay | Admitting: *Deleted

## 2014-10-14 ENCOUNTER — Telehealth: Payer: Self-pay | Admitting: *Deleted

## 2014-10-14 NOTE — Telephone Encounter (Signed)
Pre-Visit Call completed with patient and chart updated.   Pre-Visit Info documented in Specialty Comments under [employer].    

## 2014-10-15 ENCOUNTER — Ambulatory Visit (INDEPENDENT_AMBULATORY_CARE_PROVIDER_SITE_OTHER): Payer: Medicare Other | Admitting: Internal Medicine

## 2014-10-15 ENCOUNTER — Encounter: Payer: Self-pay | Admitting: Internal Medicine

## 2014-10-15 VITALS — BP 128/84 | HR 85 | Temp 98.2°F | Ht 67.0 in | Wt 157.2 lb

## 2014-10-15 DIAGNOSIS — R634 Abnormal weight loss: Secondary | ICD-10-CM | POA: Diagnosis not present

## 2014-10-15 DIAGNOSIS — I10 Essential (primary) hypertension: Secondary | ICD-10-CM | POA: Diagnosis not present

## 2014-10-15 DIAGNOSIS — F32A Depression, unspecified: Secondary | ICD-10-CM | POA: Insufficient documentation

## 2014-10-15 DIAGNOSIS — I482 Chronic atrial fibrillation, unspecified: Secondary | ICD-10-CM

## 2014-10-15 DIAGNOSIS — Z0000 Encounter for general adult medical examination without abnormal findings: Secondary | ICD-10-CM

## 2014-10-15 DIAGNOSIS — F329 Major depressive disorder, single episode, unspecified: Secondary | ICD-10-CM | POA: Diagnosis not present

## 2014-10-15 DIAGNOSIS — R413 Other amnesia: Secondary | ICD-10-CM | POA: Diagnosis not present

## 2014-10-15 LAB — TSH: TSH: 1.07 u[IU]/mL (ref 0.35–4.50)

## 2014-10-15 MED ORDER — METOPROLOL TARTRATE 50 MG PO TABS: 25.0000 mg | ORAL_TABLET | Freq: Two times a day (BID) | ORAL | Status: DC

## 2014-10-15 MED ORDER — FUROSEMIDE 40 MG PO TABS: 20.0000 mg | ORAL_TABLET | Freq: Every day | ORAL | Status: DC

## 2014-10-15 MED ORDER — ESCITALOPRAM OXALATE 5 MG PO TABS: 5.0000 mg | ORAL_TABLET | Freq: Every day | ORAL | Status: DC

## 2014-10-15 MED ORDER — SIMVASTATIN 40 MG PO TABS: 40.0000 mg | ORAL_TABLET | Freq: Every day | ORAL | Status: DC

## 2014-10-15 NOTE — Progress Notes (Signed)
Subjective:    Patient ID: Virginia Price, female    DOB: July 02, 1925, 81 y.o.   MRN: [id number]  DOS:  10/15/2014 Type of visit - description : cpx Here with her daughter Katharine Look  Here for Medicare AWV, here w/ Jackelyn Poling (daughter in law): 1. Risk factors based on Past M, S, F history: yes   2.  Physical Activities: sedentary, able to do her ADL    3. Depression/mood: used to be on meds, admits to feeling depress 4.  Hearing: some problems, no worse than before, has seen an audiologist before   5.  ADL's: independent , still drives  ; needs help cleaning the house  6.  Fall Risk: had a fall a month ago, no major injury, prevention discusse, use the cane and [place]! . See AVS, PT referral (declined) 7.  Safety: does feels safe at home   8.  Height, weight, &visual acuity: see VS. vision ok w/ glasses   9.  Counseling: see below   10  Labs ordered based on risk factors: yes   11. Referral Coordination: if needed   12. Care Plan: see below   13.   Cognitive Assessment : memory, alertness  and motor skills seem appropriate 13. Care team updated    15. End-of-life care discussed, has a healthcare power of attorney   additionally we discussed the following  Depression, Katharine Look reports that patient has been depressed for a while. "Feeling blue" most days. Patient denies suicidal ideas. She is also very anxious about taking her medications. Atrial fibrillation, on Coumadin follow-up by the Coumadin clinic, on beta blockers. High cholesterol, good compliance with simvastatin Memory loss, Katharine Look reports episode of confusion most days, does not remember date-day , does not remember to take her medications. Having chronic diarrhea, to see GI soon. The diarrhea is postprandial and associated with abdominal cramps.   Review of Systems  Constitutional: No fever, chills.   No unusual sweats HEENT: No dental problems, ear discharge, facial swelling, voice changes. No eye discharge, redness or  intolerance to light Respiratory: No wheezing. occ  Cough , some SOB, no a new issue  Cardiovascular: No CP, leg swelling or palpitations GI: no nausea, vomiting.  No blood in the stools. No dysphagia   Endocrine: No polyphagia, polyuria or polydipsia GU: No dysuria, gross hematuria, difficulty urinating. No urinary urgency or frequency. Musculoskeletal: No joint swellings or unusual aches or pains Skin: no palor or rash; some discoloration of the feet Allergic, immunologic: No environmental allergies or food allergies Neurological: No dizziness or syncope. No headaches. No diplopia, slurred speech, motor deficits, facial numbness Hematological: No enlarged lymph nodes, easy bruising or bleeding Psychiatry: No hallucinations, behavior problems or confusion.      Past Medical History  Diagnosis Date  . Atrial fibrillation     Coumadin therapy, dx via  Holter monitor, March, 2012, atrial fib heart rate ranges, no more bradycardia or tachycardia while on digoxin  . Chest pain     Catheterization normal 2004, /  nuclear 2006 no ischemia  . Hyperlipidemia   . Hypertension   . Stroke     (10-03 L MCA territory)  . Allergic rhinitis   . GERD (gastroesophageal reflux disease)   . Diverticular disease   . Colon polyp   . Abnormal chest CT     "innumerable small nodules  . Malignant melanoma   . Memory loss   . Restless leg syndrome   . CHF (congestive heart failure)  Diastolic with fluid overload, normal systolic function,, July, 2012.  Atrial fibrillation  . Mitral regurgitation     Mild to moderate, echo, January, 2012  . Erosive esophagitis   . Esophageal stricture   . Fibromyalgia     question of    Past Surgical History  Procedure Laterality Date  . Cholecystectomy  1991  . Radical hysterectomy  1978    hyst and B oophorectomy  . Cataract extraction      bilateral  . Appendectomy    . Mohs surgery      03/2005    History   Social History  . Marital Status:  Widowed    Spouse Name: N/A  . Number of Children: 3  . Years of Education: N/A   Occupational History  . retired     Social History Main Topics  . Smoking status: Never Smoker   . Smokeless tobacco: Never Used  . Alcohol Use: No  . Drug Use: No  . Sexual Activity: Not on file   Other Topics Concern  . Not on file   Social History Narrative   Lives by herself, 1 daughter in [place], 1 son in [place], 1 daughter in [place]-- still drives    Her neighbor very close to her Vaughan Basta)   Still drives short distances     Family History  Problem Relation Age of Onset  . Heart disease Mother   . Colon polyps Mother   . Diabetes Mother   . Heart disease Father   . Diabetes Father   . Breast cancer Neg Hx   . Colon cancer Neg Hx        Medication List       This list is accurate as of: 10/15/14  6:02 PM.  Always use your most recent med list.               albuterol 108 (90 BASE) MCG/ACT inhaler  Commonly known as:  PROVENTIL HFA;VENTOLIN HFA  Inhale 2 puffs into the lungs every 6 (six) hours as needed for wheezing or shortness of breath.     calcium-vitamin D 250-125 MG-UNIT per tablet  Commonly known as:  OSCAL  Take 1 tablet by mouth daily.     cephALEXin 250 MG capsule  Commonly known as:  KEFLEX  Take 250 mg by mouth daily.     escitalopram 5 MG tablet  Commonly known as:  LEXAPRO  Take 1 tablet (5 mg total) by mouth daily.     fluticasone 50 MCG/ACT nasal spray  Commonly known as:  FLONASE  Place 2 sprays into the nose as needed for allergies or rhinitis.     furosemide 40 MG tablet  Commonly known as:  LASIX  Take 0.5 tablets (20 mg total) by mouth daily.     metoprolol 50 MG tablet  Commonly known as:  LOPRESSOR  Take 0.5 tablets (25 mg total) by mouth 2 (two) times daily.     multivitamin per tablet  Take 1 tablet by mouth daily.     nitroGLYCERIN 0.4 MG/SPRAY spray  Commonly known as:  NITROLINGUAL  Place 1 spray under the tongue every 5  (five) minutes x 3 doses as needed. Chest pain     simvastatin 40 MG tablet  Commonly known as:  ZOCOR  Take 1 tablet (40 mg total) by mouth at bedtime.     warfarin 2 MG tablet  Commonly known as:  COUMADIN  TAKE AS DIRECTED  Objective:   Physical Exam BP 128/84 mmHg  Pulse 85  Temp(Src) 98.2 F (36.8 C) (Oral)  Ht 5\' 7"  (1.702 m)  Wt 157 lb 4 oz (71.328 kg)  BMI 24.62 kg/m2  SpO2 97%  General:   Well developed, well nourished . NAD.  Neck:  Full range of motion  HEENT:  Normocephalic . Face symmetric, atraumatic Lungs:  CTA B Normal respiratory effort, no intercostal retractions, no accessory muscle use. Heart: irreg  No pretibial edema bilaterally  Abdomen:  Not distended, soft, non-tender. No rebound or rigidity. No mass,organomegaly. No bruit Extremities: Feet are inspected, no actual cyanosis, there is a mild discoloration at the dorsum of the foot but good pedal pulses, no warmness or redness. Good capillary refill Skin: Exposed areas without rash. Not pale. Not jaundice Neurologic:  alert & oriented to self, time and space.  Speech normal  Strength symmetric and appropriate for age.  Psych:  She seems a slightly depressed and anxious, not far from baseline, she seems more passive than before.       Assessment & Plan:       Depression Patient is certainly depressed, PQH9 scored 22 including thoughts of "better off dead" although the patient strongly declined suicidal ideas. Recommend counseling, SSRI ? Wellbutrin ?(which she used to take before) ; they leave it up to me, Plan:   Lexapro 5 mg daily, suicidal precautions discussed Declined counseling   Weight loss Due to depression? Due to chronic diarrhea? Recent labs show a normal BMP, LFTs and CBC. Will check TSH.  Diarrhea Complains of   Diarrhea  X months , no blood in the stools , to see GI soon. Diarrhea is postprandial, GI angina? Will check stool studies , she is on Keflex for  chronic UTIs  DJD Severe DJD, mostly at the knee, unable to have any aggressive intervention  Hypertension, symptoms well-controlled  Atrial fibrillation On Coumadin and rate control  Memory impairment Will start treatment for depression today and do a formal mental exam when she comes back  Hyperlipidemia Vytorin was switch to simvastatin, last LDL 144.   No change for now   F2F >> 45 (> 30 minutes counseling about depression and managing  chronic med problems, reviewing the chart)

## 2014-10-15 NOTE — Progress Notes (Signed)
Pre visit review using our clinic review tool, if applicable. No additional management support is needed unless otherwise documented below in the visit note. 
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
## 2014-10-15 NOTE — Patient Instructions (Signed)
Get your blood work before you leave    Lab provided containers for stool samples: WBCs, culture, C. Difficile (DX diarrhea)  Come back to the office in 1 month  for a routine check up       Fall Prevention and [place] cause injuries and can affect all age groups. It is possible to use preventive measures to significantly decrease the likelihood of falls. There are many simple measures which can make your home safer and prevent falls. OUTDOORS  Repair cracks and edges of walkways and driveways.  Remove high doorway thresholds.  Trim shrubbery on the main path into your home.  Have good outside lighting.  Clear walkways of tools, rocks, debris, and clutter.  Check that handrails are not broken and are securely fastened. Both sides of steps should have handrails.  Have leaves, snow, and ice cleared regularly.  Use sand or salt on walkways during winter months.  In the garage, clean up grease or oil spills. BATHROOM  Install night lights.  Install grab bars by the toilet and in the tub and shower.  Use non-skid mats or decals in the tub or shower.  Place a plastic non-slip stool in the shower to sit on, if needed.  Keep floors dry and clean up all water on the floor immediately.  Remove soap buildup in the tub or shower on a regular basis.  Secure bath mats with non-slip, double-sided rug tape.  Remove throw rugs and tripping hazards from the floors. BEDROOMS  Install night lights.  Make sure a bedside light is easy to reach.  Do not use oversized bedding.  Keep a telephone by your bedside.  Have a firm chair with side arms to use for getting dressed.  Remove throw rugs and tripping hazards from the floor. KITCHEN  Keep handles on pots and pans turned toward the center of the stove. Use back burners when possible.  Clean up spills quickly and allow time for drying.  Avoid walking on wet floors.  Avoid hot utensils and knives.  Position  shelves so they are not too high or low.  Place commonly used objects within easy reach.  If necessary, use a sturdy step stool with a grab bar when reaching.  Keep electrical cables out of the way.  Do not use floor polish or wax that makes floors slippery. If you must use wax, use non-skid floor wax.  Remove throw rugs and tripping hazards from the floor. STAIRWAYS  Never leave objects on stairs.  Place handrails on both sides of stairways and use them. Fix any loose handrails. Make sure handrails on both sides of the stairways are as long as the stairs.  Check carpeting to make sure it is firmly attached along stairs. Make repairs to worn or loose carpet promptly.  Avoid placing throw rugs at the top or bottom of stairways, or properly secure the rug with carpet tape to prevent slippage. Get rid of throw rugs, if possible.  Have an electrician put in a light switch at the top and bottom of the stairs. OTHER FALL PREVENTION TIPS  Wear low-heel or rubber-soled shoes that are supportive and fit well. Wear closed toe shoes.  When using a stepladder, make sure it is fully opened and both spreaders are firmly locked. Do not climb a closed stepladder.  Add color or contrast paint or tape to grab bars and handrails in your home. Place contrasting color strips on first and last steps.  Learn and use  mobility aids as needed. Install an electrical emergency response system.  Turn on lights to avoid dark areas. Replace light bulbs that burn out immediately. Get light switches that glow.  Arrange furniture to create clear pathways. Keep furniture in the same place.  Firmly attach carpet with non-skid or double-sided tape.  Eliminate uneven floor surfaces.  Select a carpet pattern that does not visually hide the edge of steps.  Be aware of all pets. OTHER HOME SAFETY TIPS  Set the water temperature for 120 F (48.8 C).  Keep emergency numbers on or near the telephone.  Keep  smoke detectors on every level of the home and near sleeping areas. Document Released: 05/05/2002 Document Revised: 11/14/2011 Document Reviewed: 08/04/2011 [facility] Patient Information 2015 [place], [place]. This information is not intended to replace advice given to you by your health care provider. Make sure you discuss any questions you have with your health care provider.   Preventive Care for Adults Ages 49 and over  Blood pressure check.** / Every 1 to 2 years.  Lipid and cholesterol check.**/ Every 5 years beginning at age 68.  Lung cancer screening. / Every year if you are aged 9-80 years and have a 30-pack-year history of smoking and currently smoke or have quit within the past 15 years. Yearly screening is stopped once you have quit smoking for at least 15 years or develop a health problem that would prevent you from having lung cancer treatment.  Fecal occult blood test (FOBT) of stool. / Every year beginning at age 42 and continuing until age 90. You may not have to do this test if you get a colonoscopy every 10 years.  Flexible sigmoidoscopy** or colonoscopy.** / Every 5 years for a flexible sigmoidoscopy or every 10 years for a colonoscopy beginning at age 39 and continuing until age 2.  Hepatitis C blood test.** / For all people born from [birth year] through 1963 and any individual with known risks for hepatitis C.  Abdominal aortic aneurysm (AAA) screening.** / A one-time screening for ages 45 to 46 years who are current or former smokers.  Skin self-exam. / Monthly.  Influenza vaccine. / Every year.  Tetanus, diphtheria, and acellular pertussis (Tdap/Td) vaccine.** / 1 dose of Td every 10 years.  Varicella vaccine.** / Consult your health care provider.  Zoster vaccine.** / 1 dose for adults aged 26 years or older.  Pneumococcal 13-valent conjugate (PCV13) vaccine.** / Consult your health care provider.  Pneumococcal polysaccharide (PPSV23) vaccine.** / 1 dose for all  adults aged 30 years and older.  Meningococcal vaccine.** / Consult your health care provider.  Hepatitis A vaccine.** / Consult your health care provider.  Hepatitis B vaccine.** / Consult your health care provider.  Haemophilus influenzae type b (Hib) vaccine.** / Consult your health care provider. **Family history and personal history of risk and conditions may change your health care provider's recommendations. Document Released: 07/11/2001 Document Revised: 05/20/2013 Document Reviewed: 10/10/2010 [facility] Patient Information 2015 [place], [place]. This information is not intended to replace advice given to you by your health care provider. Make sure you discuss any questions you have with your health care provider.

## 2014-10-16 ENCOUNTER — Other Ambulatory Visit: Payer: Self-pay | Admitting: Internal Medicine

## 2014-10-20 ENCOUNTER — Ambulatory Visit: Payer: Medicare Other | Admitting: Gastroenterology

## 2014-10-20 ENCOUNTER — Telehealth: Payer: Self-pay | Admitting: Gastroenterology

## 2014-10-20 NOTE — Telephone Encounter (Signed)
No charge. 

## 2014-10-20 NOTE — Telephone Encounter (Signed)
Do you want to charge? 

## 2014-10-21 ENCOUNTER — Encounter: Payer: Self-pay | Admitting: Nurse Practitioner

## 2014-10-21 NOTE — Progress Notes (Signed)
This encounter was created in error - please disregard.

## 2014-11-03 ENCOUNTER — Ambulatory Visit (INDEPENDENT_AMBULATORY_CARE_PROVIDER_SITE_OTHER): Payer: Medicare Other | Admitting: *Deleted

## 2014-11-03 DIAGNOSIS — Z5181 Encounter for therapeutic drug level monitoring: Secondary | ICD-10-CM | POA: Diagnosis not present

## 2014-11-03 LAB — POCT INR: INR: 3.3

## 2014-11-03 NOTE — Progress Notes (Signed)
I have reviewed and agree with the plan. 

## 2014-11-03 NOTE — Progress Notes (Signed)
Pre visit review using our clinic review tool, if applicable. No additional management support is needed unless otherwise documented below in the visit note. 

## 2014-11-16 ENCOUNTER — Ambulatory Visit: Payer: Medicare Other | Admitting: Internal Medicine

## 2014-11-17 ENCOUNTER — Telehealth: Payer: Self-pay | Admitting: Internal Medicine

## 2014-11-17 NOTE — Telephone Encounter (Signed)
No, thx 

## 2014-11-17 NOTE — Telephone Encounter (Signed)
Pt no show for appt 11/16/14 11:15am, follow up 15 appt, called in 9am same day and rescheduled for 11/20/14, charge?

## 2014-11-20 ENCOUNTER — Encounter: Payer: Self-pay | Admitting: Internal Medicine

## 2014-11-20 ENCOUNTER — Other Ambulatory Visit: Payer: Self-pay | Admitting: Cardiology

## 2014-11-20 ENCOUNTER — Ambulatory Visit (INDEPENDENT_AMBULATORY_CARE_PROVIDER_SITE_OTHER): Payer: Medicare Other | Admitting: Internal Medicine

## 2014-11-20 ENCOUNTER — Other Ambulatory Visit: Payer: Self-pay | Admitting: Internal Medicine

## 2014-11-20 VITALS — BP 126/78 | HR 90 | Temp 97.8°F | Ht 67.0 in | Wt 160.5 lb

## 2014-11-20 DIAGNOSIS — R0789 Other chest pain: Secondary | ICD-10-CM

## 2014-11-20 DIAGNOSIS — F32A Depression, unspecified: Secondary | ICD-10-CM

## 2014-11-20 DIAGNOSIS — R413 Other amnesia: Secondary | ICD-10-CM | POA: Diagnosis not present

## 2014-11-20 DIAGNOSIS — R197 Diarrhea, unspecified: Secondary | ICD-10-CM | POA: Diagnosis not present

## 2014-11-20 DIAGNOSIS — F329 Major depressive disorder, single episode, unspecified: Secondary | ICD-10-CM

## 2014-11-20 MED ORDER — ESCITALOPRAM OXALATE 10 MG PO TABS: 10.0000 mg | ORAL_TABLET | Freq: Every day | ORAL | Status: DC

## 2014-11-20 NOTE — Patient Instructions (Signed)
Increase Lexapro to 10 mg daly

## 2014-11-20 NOTE — Progress Notes (Signed)
Pre visit review using our clinic review tool, if applicable. No additional management support is needed unless otherwise documented below in the visit note. 

## 2014-11-20 NOTE — Progress Notes (Signed)
Subjective:    Patient ID: Virginia Price, female    DOB: [birth year], 81 y.o.   MRN: [id number]  DOS:  11/20/2014 Type of visit - description :  Follow-up for depression and memory issues, here with two of her daughters Interval history:  depression, started Lexapro, patient feels about the same, daughters report her mood has definitely improved. She continue to be concerned about her memory,  see nurses notes, had trouble starting her car one day  But denies to me being lost  Mechanical fall 11/14/2014, landed on her ight side, still sightly sore at the right-sided chest.Denies headache or neck pain  few days ago,she was shopping, they cashier not her to be "blue",  when she went back to her car,she had some trouble a starting it but eventually able to drive safely home.    Review of Systems See history of present illness   Past Medical History  Diagnosis Date  . Atrial fibrillation     Coumadin therapy, dx via  Holter monitor, March, 2012, atrial fib heart rate ranges, no more bradycardia or tachycardia while on digoxin  . Chest pain     Catheterization normal 2004, /  nuclear 2006 no ischemia  . Hyperlipidemia   . Hypertension   . Stroke     (10-03 L MCA territory)  . Allergic rhinitis   . GERD (gastroesophageal reflux disease)   . Diverticular disease   . Colon polyp   . Abnormal chest CT     "innumerable small nodules  . Malignant melanoma   . Memory loss   . Restless leg syndrome   . CHF (congestive heart failure)     Diastolic with fluid overload, normal systolic function,, July, 2012.  Atrial fibrillation  . Mitral regurgitation     Mild to moderate, echo, January, 2012  . Erosive esophagitis   . Esophageal stricture   . Fibromyalgia     question of    Past Surgical History  Procedure Laterality Date  . Cholecystectomy  1991  . Radical hysterectomy  1978    hyst and B oophorectomy  . Cataract extraction      bilateral  . Appendectomy    . Mohs surgery        03/2005    History   Social History  . Marital Status: Widowed    Spouse Name: N/A  . Number of Children: 3  . Years of Education: N/A   Occupational History  . retired     Social History Main Topics  . Smoking status: Never Smoker   . Smokeless tobacco: Never Used  . Alcohol Use: No  . Drug Use: No  . Sexual Activity: Not on file   Other Topics Concern  . Not on file   Social History Narrative   Lives by herself, 1 daughter in [place], 1 son in [place], 1 daughter in [place]     Her neighbor very close to her Vaughan Basta)    Contact ---Katharine Look (daughter)  [phone]        Medication List       This list is accurate as of: 11/20/14  2:50 PM.  Always use your most recent med list.               albuterol 108 (90 BASE) MCG/ACT inhaler  Commonly known as:  PROVENTIL HFA;VENTOLIN HFA  Inhale 2 puffs into the lungs every 6 (six) hours as needed for wheezing or shortness of breath.     calcium-vitamin  D 250-125 MG-UNIT per tablet  Commonly known as:  OSCAL  Take 1 tablet by mouth daily.     cephALEXin 250 MG capsule  Commonly known as:  KEFLEX  Take 250 mg by mouth daily.     escitalopram 5 MG tablet  Commonly known as:  LEXAPRO  Take 1 tablet (5 mg total) by mouth daily.     fluticasone 50 MCG/ACT nasal spray  Commonly known as:  FLONASE  Place 2 sprays into the nose as needed for allergies or rhinitis.     furosemide 40 MG tablet  Commonly known as:  LASIX  Take 0.5 tablets (20 mg total) by mouth daily.     metoprolol 50 MG tablet  Commonly known as:  LOPRESSOR  Take 0.5 tablets (25 mg total) by mouth 2 (two) times daily.     multivitamin per tablet  Take 1 tablet by mouth daily.     nitroGLYCERIN 0.4 MG/SPRAY spray  Commonly known as:  NITROLINGUAL  Place 1 spray under the tongue every 5 (five) minutes x 3 doses as needed. Chest pain     simvastatin 40 MG tablet  Commonly known as:  ZOCOR  Take 1 tablet (40 mg total) by mouth at bedtime.      warfarin 2 MG tablet  Commonly known as:  COUMADIN  TAKE AS DIRECTED           Objective:   Physical Exam BP 126/78 mmHg  Pulse 90  Temp(Src) 97.8 F (36.6 C) (Oral)  Ht 5\' 7"  (1.702 m)  Wt 160 lb 8 oz (72.802 kg)  BMI 25.13 kg/m2  SpO2 96% General:   Well developed, well nourished . NAD.  HEENT:  Normocephalic . Face symmetric, atraumatic Lungs:  CTA B Normal respiratory effort, no intercostal retractions, no accessory muscle use. Chest wall, R side slt TTP, no hematoma - deformities  Heart: irreg Skin: Not pale. Not jaundice Neurologic:  alert & MMSE scored 27 Psych--  Cognition and judgment appear intact.  Cooperative with normal attention span and concentration.  Behavior appropriate.  She looks better, smiling, more talkative.       Assessment & Plan:     Weight loss, Has gained 3 pounds and now patient is upset because she is gaining weight.  Diarrhea, Stool studies pending, ongoing issue, refer to GI   At the end of the visit, she also reports chest pain and difficulty breathing, chest pain is going on for several months, anteriorly, ill-defined. Difficulty breathing is going on for years and gradually getting worse. Plan: Will ask cardiology to see ASAP,  ER if pain increases.  Also, would like to see about wheelchair, I will be happy to sign needed paperwork

## 2014-11-21 LAB — C. DIFFICILE GDH AND TOXIN A/B
C. DIFFICILE GDH: NOT DETECTED
C. difficile Toxin A/B: NOT DETECTED

## 2014-11-21 NOTE — Assessment & Plan Note (Signed)
Memory impairment MMSE 27, minimal problems, recommend to treat depression first before we embark on treat her memory impairment.

## 2014-11-21 NOTE — Assessment & Plan Note (Signed)
The last visit, she is started on low dose of lexapro, although the patient denies any improvement, the daughters and me think that she has improved. Plan: Increase Lexapro to 10 mg, reassess in 3 months

## 2014-11-22 LAB — FECAL LACTOFERRIN, QUANT: Lactoferrin: NEGATIVE

## 2014-11-24 LAB — STOOL CULTURE

## 2014-11-25 ENCOUNTER — Other Ambulatory Visit: Payer: Self-pay | Admitting: Cardiology

## 2014-11-26 ENCOUNTER — Other Ambulatory Visit (INDEPENDENT_AMBULATORY_CARE_PROVIDER_SITE_OTHER): Payer: Medicare Other

## 2014-11-26 ENCOUNTER — Ambulatory Visit (INDEPENDENT_AMBULATORY_CARE_PROVIDER_SITE_OTHER): Payer: Medicare Other | Admitting: Internal Medicine

## 2014-11-26 ENCOUNTER — Ambulatory Visit (INDEPENDENT_AMBULATORY_CARE_PROVIDER_SITE_OTHER)
Admission: RE | Admit: 2014-11-26 | Discharge: 2014-11-26 | Disposition: A | Discharge status: Home / Self Care | Payer: Medicare Other | Source: Ambulatory Visit | Attending: Internal Medicine | Admitting: Internal Medicine

## 2014-11-26 ENCOUNTER — Encounter: Payer: Self-pay | Admitting: Internal Medicine

## 2014-11-26 VITALS — BP 126/78 | HR 81 | Temp 97.6°F | Resp 16 | Ht 67.0 in | Wt 160.0 lb

## 2014-11-26 DIAGNOSIS — R0602 Shortness of breath: Secondary | ICD-10-CM

## 2014-11-26 DIAGNOSIS — I5022 Chronic systolic (congestive) heart failure: Secondary | ICD-10-CM

## 2014-11-26 DIAGNOSIS — R05 Cough: Secondary | ICD-10-CM

## 2014-11-26 DIAGNOSIS — S3993XA Unspecified injury of pelvis, initial encounter: Secondary | ICD-10-CM | POA: Diagnosis not present

## 2014-11-26 DIAGNOSIS — R059 Cough, unspecified: Secondary | ICD-10-CM

## 2014-11-26 DIAGNOSIS — S3991XA Unspecified injury of abdomen, initial encounter: Secondary | ICD-10-CM | POA: Diagnosis not present

## 2014-11-26 DIAGNOSIS — R072 Precordial pain: Secondary | ICD-10-CM

## 2014-11-26 DIAGNOSIS — R109 Unspecified abdominal pain: Secondary | ICD-10-CM | POA: Diagnosis not present

## 2014-11-26 LAB — COMPREHENSIVE METABOLIC PANEL
ALBUMIN: 3.9 g/dL (ref 3.5–5.2)
ALK PHOS: 77 U/L (ref 39–117)
ALT: 21 U/L (ref 0–35)
AST: 19 U/L (ref 0–37)
BUN: 15 mg/dL (ref 6–23)
CHLORIDE: 105 meq/L (ref 96–112)
CO2: 30 meq/L (ref 19–32)
Calcium: 9.2 mg/dL (ref 8.4–10.5)
Creatinine, Ser: 0.94 mg/dL (ref 0.40–1.20)
GFR: 59.93 mL/min — ABNORMAL LOW (ref >60.00)
GLUCOSE: 90 mg/dL (ref 70–99)
POTASSIUM: 4.3 meq/L (ref 3.5–5.1)
Sodium: 141 mEq/L (ref 135–145)
Total Bilirubin: 1 mg/dL (ref 0.2–1.2)
Total Protein: 6.8 g/dL (ref 6.0–8.3)

## 2014-11-26 LAB — CARDIAC PANEL
CK TOTAL: 29 U/L (ref 7–177)
CK-MB: 1.1 ng/mL (ref 0.3–4.0)
RELATIVE INDEX: 3.8 calc — AB (ref 0.0–2.5)

## 2014-11-26 LAB — CBC WITH DIFFERENTIAL/PLATELET
Basophils Absolute: 0 10*3/uL (ref 0.0–0.1)
Basophils Relative: 0.4 % (ref 0.0–3.0)
Eosinophils Absolute: 0.1 10*3/uL (ref 0.0–0.7)
Eosinophils Relative: 1.3 % (ref 0.0–5.0)
HCT: 42.5 % (ref 36.0–46.0)
Hemoglobin: 13.9 g/dL (ref 12.0–15.0)
Lymphocytes Relative: 17.5 % (ref 12.0–46.0)
Lymphs Abs: 1.3 10*3/uL (ref 0.7–4.0)
MCHC: 32.8 g/dL (ref 30.0–36.0)
MCV: 86.1 fl (ref 78.0–100.0)
MONO ABS: 0.4 10*3/uL (ref 0.1–1.0)
Monocytes Relative: 6 % (ref 3.0–12.0)
Neutro Abs: 5.4 10*3/uL (ref 1.4–7.7)
Neutrophils Relative %: 74.8 % (ref 43.0–77.0)
Platelets: 193 10*3/uL (ref 150.0–400.0)
RBC: 4.93 Mil/uL (ref 3.87–5.11)
RDW: 16.6 % — AB (ref 11.5–15.5)
WBC: 7.2 10*3/uL (ref 4.0–10.5)

## 2014-11-26 LAB — TROPONIN I: TNIDX: 0.01 ug/L (ref 0.00–0.06)

## 2014-11-26 LAB — BRAIN NATRIURETIC PEPTIDE: Pro B Natriuretic peptide (BNP): 165 pg/mL — ABNORMAL HIGH (ref 0.0–100.0)

## 2014-11-26 NOTE — Progress Notes (Signed)
Pre visit review using our clinic review tool, if applicable. No additional management support is needed unless otherwise documented below in the visit note. 

## 2014-11-26 NOTE — Progress Notes (Signed)
Subjective:  Patient ID: [name], female    DOB: 12-Sep-1925  Age: 81 y.o. MRN: [id number]  CC: Cough  New to me   HPI JNIYA MADARA presents for chronic NP cough and right lower rib cage pain for 10 days s/p a fall. She also offers several other complaints that she tells me have been going on for a while but she wanted to discuss them today.  Outpatient Prescriptions Prior to Visit  Medication Sig Dispense Refill  . albuterol (PROVENTIL HFA;VENTOLIN HFA) 108 (90 BASE) MCG/ACT inhaler Inhale 2 puffs into the lungs every 6 (six) hours as needed for wheezing or shortness of breath. 1 Inhaler 2  . calcium-vitamin D (OSCAL) 250-125 MG-UNIT per tablet Take 1 tablet by mouth daily.      Marland Kitchen escitalopram (LEXAPRO) 10 MG tablet Take 1 tablet (10 mg total) by mouth daily. 30 tablet 6  . fluticasone (FLONASE) 50 MCG/ACT nasal spray Place 2 sprays into the nose as needed for allergies or rhinitis.     . furosemide (LASIX) 40 MG tablet Take 0.5 tablets (20 mg total) by mouth daily. 15 tablet 6  . metoprolol (LOPRESSOR) 50 MG tablet Take 0.5 tablets (25 mg total) by mouth 2 (two) times daily. 90 tablet 1  . multivitamin (THERAGRAN) per tablet Take 1 tablet by mouth daily.      . nitroGLYCERIN (NITROLINGUAL) 0.4 MG/SPRAY spray Place 1 spray under the tongue every 5 (five) minutes x 3 doses as needed. Chest pain    . simvastatin (ZOCOR) 40 MG tablet Take 1 tablet (40 mg total) by mouth at bedtime. 90 tablet 1  . warfarin (COUMADIN) 2 MG tablet TAKE AS DIRECTED 30 tablet 3  . cephALEXin (KEFLEX) 250 MG capsule Take 250 mg by mouth daily.     No facility-administered medications prior to visit.    ROS Review of Systems  Constitutional: Negative.  Negative for fever, chills, diaphoresis, appetite change and fatigue.  HENT: Negative.   Eyes: Negative.   Respiratory: Positive for cough and shortness of breath ("for a long time"). Negative for choking, chest tightness, wheezing and stridor.     Cardiovascular: Positive for chest pain. Negative for palpitations and leg swelling.  Gastrointestinal: Positive for abdominal pain (for over 1 year) and diarrhea (for over 1 year). Negative for nausea, vomiting, constipation, blood in stool, anal bleeding and rectal pain.  Endocrine: Negative.   Genitourinary: Negative.  Negative for dysuria, urgency, hematuria, flank pain, decreased urine volume, difficulty urinating and dyspareunia.  Musculoskeletal: Negative.  Negative for myalgias, back pain, joint swelling and arthralgias.  Skin: Negative.  Negative for color change and rash.  Allergic/Immunologic: Negative.   Neurological: Negative.  Negative for dizziness, tremors, seizures, speech difficulty, weakness, light-headedness and headaches.  Hematological: Negative.  Negative for adenopathy. Does not bruise/bleed easily.  Psychiatric/Behavioral: Positive for confusion and decreased concentration.    Objective:  BP 126/78 mmHg  Pulse 81  Temp(Src) 97.6 F (36.4 C) (Oral)  Resp 16  Ht 5\' 7"  (1.702 m)  Wt 160 lb (72.576 kg)  BMI 25.05 kg/m2  SpO2 97%  BP Readings from Last 3 Encounters:  11/26/14 126/78  11/20/14 126/78  10/15/14 128/84    Wt Readings from Last 3 Encounters:  11/26/14 160 lb (72.576 kg)  11/20/14 160 lb 8 oz (72.802 kg)  10/15/14 157 lb 4 oz (71.328 kg)    Physical Exam  Constitutional: She is oriented to person, place, and time. She appears well-developed and well-nourished.  HENT:  Head: Normocephalic and atraumatic.  Mouth/Throat: Oropharynx is clear and moist. No oropharyngeal exudate.  Eyes: Conjunctivae are normal. Right eye exhibits no discharge. Left eye exhibits no discharge. No scleral icterus.  Neck: Normal range of motion. Neck supple. No JVD present. No tracheal deviation present. No thyromegaly present.  Cardiovascular: Normal rate, normal heart sounds and intact distal pulses.  An irregularly irregular rhythm present. Exam reveals no gallop  and no friction rub.   No murmur heard. EKG today  Atrial fibrillation  Low voltage in limb leads.   ABNORMAL    Pulmonary/Chest: Effort normal and breath sounds normal. No stridor. No respiratory distress. She has no wheezes. She has no rales. Chest wall is not dull to percussion. She exhibits tenderness and bony tenderness. She exhibits no mass, no laceration, no crepitus, no edema, no swelling and no retraction.    Abdominal: Soft. Normal appearance and bowel sounds are normal. She exhibits no distension. There is no hepatosplenomegaly, splenomegaly or hepatomegaly. There is no tenderness. There is no rebound, no guarding and no CVA tenderness. No hernia. Hernia confirmed negative in the ventral area.  Musculoskeletal: Normal range of motion. She exhibits no edema or tenderness.  Lymphadenopathy:    She has no cervical adenopathy.  Neurological: She is oriented to person, place, and time.  Skin: Skin is warm and dry. No rash noted. She is not diaphoretic. No erythema. No pallor.  Psychiatric: She has a normal mood and affect. Judgment and thought content normal. Her mood appears not anxious. Her speech is delayed and tangential. She is slowed and withdrawn. Cognition and memory are impaired. She does not exhibit a depressed mood. She exhibits abnormal recent memory and abnormal remote memory. She is inattentive.  Vitals reviewed.   Lab Results  Component Value Date   WBC 7.2 11/26/2014   HGB 13.9 11/26/2014   HCT 42.5 11/26/2014   PLT 193.0 11/26/2014   GLUCOSE 90 11/26/2014   CHOL 226* 06/12/2014   TRIG 221.0* 06/12/2014   HDL 52.60 06/12/2014   LDLDIRECT 144.0 06/12/2014   LDLCALC 96 07/21/2013   ALT 21 11/26/2014   AST 19 11/26/2014   NA 141 11/26/2014   K 4.3 11/26/2014   CL 105 11/26/2014   CREATININE 0.94 11/26/2014   BUN 15 11/26/2014   CO2 30 11/26/2014   TSH 1.07 10/15/2014   INR 3.3 11/03/2014    Dg Chest 2 View  06/30/2014   CLINICAL DATA:  Chest pain and  pressure.  Shortness of breath  EXAM: CHEST  2 VIEW  COMPARISON:  10/01/2013  FINDINGS: Chronic pulmonary hyperinflation and interstitial coarsening. No superimposed pneumonia or edema. No effusion or pneumothorax. Normal heart size and aortic contours. No osseous findings to explain acute chest pain.  IMPRESSION: Chronic bronchitic changes without acute superimposed disease.   Electronically Signed   By: Jorje Guild M.D.   On: 06/30/2014 11:31    Assessment & Plan:   Dawnyel was seen today for cough.  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 Sherley's cephALEXin. I am also having her maintain her calcium-vitamin D, multivitamin, nitroGLYCERIN, fluticasone, albuterol, warfarin, simvastatin, metoprolol, furosemide, and escitalopram.  No orders of the defined types were placed in this encounter.     Follow-up: Return in about 3 weeks (around 12/17/2014).  Scarlette Calico, MD

## 2014-11-26 NOTE — Patient Instructions (Signed)
Cough, Adult  A cough is a reflex that helps clear your throat and airways. It can help heal the body or may be a reaction to an irritated airway. A cough may only last 2 or 3 weeks (acute) or may last more than 8 weeks (chronic).  CAUSES Acute cough:  Viral or bacterial infections. Chronic cough:  Infections.  Allergies.  Asthma.  Post-nasal drip.  Smoking.  Heartburn or acid reflux.  Some medicines.  Chronic lung problems (COPD).  Cancer. SYMPTOMS   Cough.  Fever.  Chest pain.  Increased breathing rate.  High-pitched whistling sound when breathing (wheezing).  Colored mucus that you cough up (sputum). TREATMENT   A bacterial cough may be treated with antibiotic medicine.  A viral cough must run its course and will not respond to antibiotics.  Your caregiver may recommend other treatments if you have a chronic cough. HOME CARE INSTRUCTIONS   Only take over-the-counter or prescription medicines for pain, discomfort, or fever as directed by your caregiver. Use cough suppressants only as directed by your caregiver.  Use a cold steam vaporizer or humidifier in your bedroom or home to help loosen secretions.  Sleep in a semi-upright position if your cough is worse at night.  Rest as needed.  Stop smoking if you smoke. SEEK IMMEDIATE MEDICAL CARE IF:   You have pus in your sputum.  Your cough starts to worsen.  You cannot control your cough with suppressants and are losing sleep.  You begin coughing up blood.  You have difficulty breathing.  You develop pain which is getting worse or is uncontrolled with medicine.  You have a fever. MAKE SURE YOU:   Understand these instructions.  Will watch your condition.  Will get help right away if you are not doing well or get worse. Document Released: 11/11/2010 Document Revised: 08/07/2011 Document Reviewed: 11/11/2010 [employer] Patient Information 2015 [employer]. This information is not intended  to replace advice given to you by your health care provider. Make sure you discuss any questions you have with your health care provider.  

## 2014-12-01 ENCOUNTER — Ambulatory Visit: Payer: Medicare Other

## 2014-12-02 ENCOUNTER — Ambulatory Visit (INDEPENDENT_AMBULATORY_CARE_PROVIDER_SITE_OTHER): Payer: Medicare Other | Admitting: General Practice

## 2014-12-02 DIAGNOSIS — Z5181 Encounter for therapeutic drug level monitoring: Secondary | ICD-10-CM

## 2014-12-02 LAB — POCT INR: INR: 1.6

## 2014-12-02 NOTE — Progress Notes (Signed)
I have reviewed and agree with the plan. 

## 2014-12-02 NOTE — Progress Notes (Signed)
Pre visit review using our clinic review tool, if applicable. No additional management support is needed unless otherwise documented below in the visit note. 

## 2014-12-16 ENCOUNTER — Encounter: Payer: Self-pay | Admitting: Nurse Practitioner

## 2014-12-16 ENCOUNTER — Ambulatory Visit (INDEPENDENT_AMBULATORY_CARE_PROVIDER_SITE_OTHER): Payer: Medicare Other | Admitting: Nurse Practitioner

## 2014-12-16 VITALS — BP 112/70 | HR 85 | Ht 67.0 in | Wt 159.8 lb

## 2014-12-16 DIAGNOSIS — I482 Chronic atrial fibrillation, unspecified: Secondary | ICD-10-CM

## 2014-12-16 DIAGNOSIS — Z7901 Long term (current) use of anticoagulants: Secondary | ICD-10-CM | POA: Diagnosis not present

## 2014-12-16 NOTE — Patient Instructions (Addendum)
.  We will be checking the following labs today - NONE   Medication Instructions:    Continue with your current medicines.     Testing/Procedures To Be Arranged:  N/A  Follow-Up:   Recall visit for November with Dr. Meda Coffee    Other Special Instructions:   N/A  Call the [place] office at [phone] if you have any questions, problems or concerns.

## 2014-12-16 NOTE — Progress Notes (Signed)
CARDIOLOGY OFFICE NOTE  Date:  12/16/2014    Virginia Price Date of Birth: 04-09-1926 Medical Record #[id number]  PCP:  Kathlene November, MD  Cardiologist:  Ron Parker    Chief Complaint  Patient presents with  . Atrial Fibrillation    Work in visit - seen for Dr. Ron Parker    History of Present Illness: Virginia Price is a 81 y.o. female who presents today for a work in visit. Seen for Dr. Ron Parker. She has chronic atrial fib, on chronic coumadin. Normal coronaries per remote cath from 2004. Negative Myoview in 2006. Other issues include prior CVA, HTN, HLD and advanced age.  Last seen here in May - felt to be doing ok.  Comes back today. Here with her friend/neighbor. She has lots of issues. The neighbor actually provides a lot of the history. Seems to be getting more confused. Had a spell recently while at [employer] that the greeter told her she "looked blue". Went to her car - could not remember how to drive but then drove herself home. Daughter fixes her pill box. She admits that she sometimes forgets her medicines - especially her Lasix - does not like to take and will take at night when she does take it. Has had some swelling in her feet - not using support stockings. Most likely getting too much salt - daughter apparently brings her sausage biscuits to eat. Lots of GI issues reported - says this is "really what is bothering her the most" - apparently with IBS with diarrhea. Tells me that her daughter cancelled her OV with GI. Tells me how Dr. Larose Kells gave her Bentyl in the remote past. Has had recent labs noted.   Past Medical History  Diagnosis Date  . Atrial fibrillation     Coumadin therapy, dx via  Holter monitor, March, 2012, atrial fib heart rate ranges, no more bradycardia or tachycardia while on digoxin  . Chest pain     Catheterization normal 2004, /  nuclear 2006 no ischemia  . Hyperlipidemia   . Hypertension   . Stroke     (10-03 L MCA territory)  . Allergic rhinitis   . GERD  (gastroesophageal reflux disease)   . Diverticular disease   . Colon polyp   . Abnormal chest CT     "innumerable small nodules  . Malignant melanoma   . Memory loss   . Restless leg syndrome   . CHF (congestive heart failure)     Diastolic with fluid overload, normal systolic function,, July, 2012.  Atrial fibrillation  . Mitral regurgitation     Mild to moderate, echo, January, 2012  . Erosive esophagitis   . Esophageal stricture   . Fibromyalgia     question of    Past Surgical History  Procedure Laterality Date  . Cholecystectomy  1991  . Radical hysterectomy  1978    hyst and B oophorectomy  . Cataract extraction      bilateral  . Appendectomy    . Mohs surgery      03/2005     Medications: Current Outpatient Prescriptions  Medication Sig Dispense Refill  . albuterol (PROVENTIL HFA;VENTOLIN HFA) 108 (90 BASE) MCG/ACT inhaler Inhale 2 puffs into the lungs every 6 (six) hours as needed for wheezing or shortness of breath. 1 Inhaler 2  . calcium-vitamin D (OSCAL) 250-125 MG-UNIT per tablet Take 1 tablet by mouth daily.      . cephALEXin (KEFLEX) 250 MG capsule Take 250 mg  by mouth 2 (two) times daily.     Marland Kitchen escitalopram (LEXAPRO) 10 MG tablet Take 1 tablet (10 mg total) by mouth daily. 30 tablet 6  . fluticasone (FLONASE) 50 MCG/ACT nasal spray Place 2 sprays into the nose as needed for allergies or rhinitis.     . furosemide (LASIX) 40 MG tablet Take 0.5 tablets (20 mg total) by mouth daily. 15 tablet 6  . metoprolol (LOPRESSOR) 50 MG tablet Take 0.5 tablets (25 mg total) by mouth 2 (two) times daily. 90 tablet 1  . multivitamin (THERAGRAN) per tablet Take 1 tablet by mouth daily.      . nitroGLYCERIN (NITROLINGUAL) 0.4 MG/SPRAY spray Place 1 spray under the tongue every 5 (five) minutes x 3 doses as needed. Chest pain    . simvastatin (ZOCOR) 40 MG tablet Take 1 tablet (40 mg total) by mouth at bedtime. 90 tablet 1  . warfarin (COUMADIN) 2 MG tablet TAKE AS DIRECTED 30  tablet 3  . traMADol (ULTRAM) 50 MG tablet     . [DISCONTINUED] potassium chloride (K-DUR) 10 MEQ tablet Take 1 tablet (10 mEq total) by mouth 2 (two) times daily. 30 tablet 1   No current facility-administered medications for this visit.    Allergies: Allergies  Allergen Reactions  . Ciprofloxacin Hives  . Penicillins Rash  . Sulfamethoxazole-Trimethoprim Rash  . Sulfonamide Derivatives Rash  . [name] [Ethacrynic Acid] Other (See Comments)    UNKNOWN REACTION  . Hydrocodone-Acetaminophen Nausea Only  . Metronidazole Other (See Comments)    UNKNOWN REACTION  . Prednisone Other (See Comments)    UNKNOWN REACTION  . Tramadol Hcl Other (See Comments)    Sleepy    Social History: The patient  reports that she has never smoked. She has never used smokeless tobacco. She reports that she does not drink alcohol or use illicit drugs.   Family History: The patient's family history includes Colon polyps in her mother; Diabetes in her father and mother; Heart disease in her father and mother. There is no history of Breast cancer or Colon cancer.   Review of Systems: Please see the history of present illness.   Otherwise, the review of systems is positive for chest pain, DOE, cough, abdominal pain, diarrhea, depression, excessive fatigue, chest pressure, irregular heart beats and wheezing.   All other systems are reviewed and negative.   Physical Exam: VS:  BP 112/70 mmHg  Pulse 85  Ht 5\' 7"  (1.702 m)  Wt 159 lb 12.8 oz (72.485 kg)  BMI 25.02 kg/m2  SpO2 96% .  BMI Body mass index is 25.02 kg/(m^2).  Wt Readings from Last 3 Encounters:  12/16/14 159 lb 12.8 oz (72.485 kg)  11/26/14 160 lb (72.576 kg)  11/20/14 160 lb 8 oz (72.802 kg)    General: Pleasant. She seems a little confused to me in no acute distress. She has lost 5 pounds since May.  HEENT: Normal. Neck: Supple, no JVD, carotid bruits, or masses noted.  Cardiac: Irregular irregular rhythm. Rate ok. No edema noted  today.  Respiratory:  Lungs are clear to auscultation bilaterally with normal work of breathing.  GI: Soft and nontender.  MS: No deformity or atrophy. Gait and ROM intact.She is using a Michele.  Skin: Warm and dry. Color is normal.  Neuro:  Strength and sensation are intact and no gross focal deficits noted.  Psych: Alert, appropriate and with normal affect.   LABORATORY DATA:  EKG:  EKG is not ordered today.   Lab  Results  Component Value Date   WBC 7.2 11/26/2014   HGB 13.9 11/26/2014   HCT 42.5 11/26/2014   PLT 193.0 11/26/2014   GLUCOSE 90 11/26/2014   CHOL 226* 06/12/2014   TRIG 221.0* 06/12/2014   HDL 52.60 06/12/2014   LDLDIRECT 144.0 06/12/2014   LDLCALC 96 07/21/2013   ALT 21 11/26/2014   AST 19 11/26/2014   NA 141 11/26/2014   K 4.3 11/26/2014   CL 105 11/26/2014   CREATININE 0.94 11/26/2014   BUN 15 11/26/2014   CO2 30 11/26/2014   TSH 1.07 10/15/2014   INR 1.6 12/02/2014    BNP (last 3 results)  Recent Labs  06/30/14 1030  BNP 147.7*    ProBNP (last 3 results)  Recent Labs  11/26/14 1529  PROBNP 165.0*     Other Studies Reviewed Today:  Study Conclusions  - Left ventricle: The cavity size was normal. Systolic function was vigorous. The estimated ejection fraction was in the range of 65% to 70%. Wall motion was normal; there were no regional wall motion abnormalities. The study was not technically sufficient to allow evaluation of LV diastolic dysfunction due to atrial fibrillation. - Aortic valve: Trileaflet; mildly thickened, mildly calcified leaflets. Sclerosis without stenosis. Mild regurgitation. - Mitral valve: Mild regurgitation. - Left atrium: The atrium was mildly dilated. - Right ventricle: Systolic function was normal. - Tricuspid valve: Moderate regurgitation. - Pulmonary arteries: Systolic pressure was mildly increased. PA peak pressure: 55mm Hg (S). - Inferior vena cava: The vessel was normal in  size. - Pericardium, extracardiac: There was no pericardial effusion.  Assessment/Plan: Multitude of somatic complaints - does not really sound cardiac related. I do not know what to make of her "turning blue". Her oxygen level is fine. Echo one year ago with normal LV function - I do not think this would have changed. She has no swelling on exam - seems to be more a reflection of not taking her medicines. Seems to have progressive memory issues - I told her she should not be driving. She seems to be having more GI complaints - she will discuss with Dr. Larose Kells when she seems him tomorrow.   Will get her to establish with Dr. Meda Coffee on return in light of Dr. Ron Parker retiring.  Current medicines are reviewed with the patient today.  The patient does not have concerns regarding medicines other than what has been noted above.  The following changes have been made:  See above.  Labs/ tests ordered today include:   No orders of the defined types were placed in this encounter.     Disposition:   FU with Dr. Meda Coffee in November.   Patient is agreeable to this plan and will call if any problems develop in the interim.   Signed: Burtis Junes, RN, ANP-C 12/16/2014 10:59 AM  [facility] [address] [place], [place]  [address] Phone: [phone] Fax: [phone]

## 2014-12-17 ENCOUNTER — Encounter: Payer: Self-pay | Admitting: Internal Medicine

## 2014-12-17 ENCOUNTER — Ambulatory Visit (INDEPENDENT_AMBULATORY_CARE_PROVIDER_SITE_OTHER): Payer: Medicare Other | Admitting: Internal Medicine

## 2014-12-17 VITALS — BP 126/72 | HR 73 | Temp 97.6°F | Ht 67.0 in | Wt 159.4 lb

## 2014-12-17 DIAGNOSIS — R072 Precordial pain: Secondary | ICD-10-CM | POA: Diagnosis not present

## 2014-12-17 DIAGNOSIS — F329 Major depressive disorder, single episode, unspecified: Secondary | ICD-10-CM | POA: Diagnosis not present

## 2014-12-17 DIAGNOSIS — F32A Depression, unspecified: Secondary | ICD-10-CM

## 2014-12-17 NOTE — Progress Notes (Signed)
Subjective:    Patient ID: Virginia Price, female    DOB: [birth year], 81 y.o.   MRN: [id number]  DOS:  12/17/2014 Type of visit - description : Follow-up, here with her daughter Interval history:  CP and shortness of breath: Since the last time I saw her, she saw Dr. Ronnald Ramp, CBC, BMP, BNP and CK were unremarkable. Saw cardiology yesterday, multiple symptoms felt not to be cardiac.   Review of Systems In general is doing about the same, still has occasional chest pain or difficulty breathing. This time, her daughter said that is usually when she does not take Lasix. Continue with frequent BMs, no blood in the stools. Emotionally is doing well  Past Medical History  Diagnosis Date  . Atrial fibrillation     Coumadin therapy, dx via  Holter monitor, March, 2012, atrial fib heart rate ranges, no more bradycardia or tachycardia while on digoxin  . Chest pain     Catheterization normal 2004, /  nuclear 2006 no ischemia  . Hyperlipidemia   . Hypertension   . Stroke     (10-03 L MCA territory)  . Allergic rhinitis   . GERD (gastroesophageal reflux disease)   . Diverticular disease   . Colon polyp   . Abnormal chest CT     "innumerable small nodules  . Malignant melanoma   . Memory loss   . Restless leg syndrome   . CHF (congestive heart failure)     Diastolic with fluid overload, normal systolic function,, July, 2012.  Atrial fibrillation  . Mitral regurgitation     Mild to moderate, echo, January, 2012  . Erosive esophagitis   . Esophageal stricture   . Fibromyalgia     question of    Past Surgical History  Procedure Laterality Date  . Cholecystectomy  1991  . Radical hysterectomy  1978    hyst and B oophorectomy  . Cataract extraction      bilateral  . Appendectomy    . Mohs surgery      03/2005    History   Social History  . Marital Status: Widowed    Spouse Name: N/A  . Number of Children: 3  . Years of Education: N/A   Occupational History  . retired       Social History Main Topics  . Smoking status: Never Smoker   . Smokeless tobacco: Never Used  . Alcohol Use: No  . Drug Use: No  . Sexual Activity: Not on file   Other Topics Concern  . Not on file   Social History Narrative   Lives by herself, 1 daughter in [place], 1 son in [place], 1 daughter in [place]     Her neighbor very close to her Vaughan Basta)    Contact ---Katharine Look (daughter)  [phone]        Medication List       This list is accurate as of: 12/17/14  1:14 PM.  Always use your most recent med list.               albuterol 108 (90 BASE) MCG/ACT inhaler  Commonly known as:  PROVENTIL HFA;VENTOLIN HFA  Inhale 2 puffs into the lungs every 6 (six) hours as needed for wheezing or shortness of breath.     calcium-vitamin D 250-125 MG-UNIT per tablet  Commonly known as:  OSCAL  Take 1 tablet by mouth daily.     cephALEXin 250 MG capsule  Commonly known as:  KEFLEX  Take 250  mg by mouth 2 (two) times daily.     escitalopram 10 MG tablet  Commonly known as:  LEXAPRO  Take 1 tablet (10 mg total) by mouth daily.     fluticasone 50 MCG/ACT nasal spray  Commonly known as:  FLONASE  Place 2 sprays into the nose as needed for allergies or rhinitis.     furosemide 40 MG tablet  Commonly known as:  LASIX  Take 0.5 tablets (20 mg total) by mouth daily.     metoprolol 50 MG tablet  Commonly known as:  LOPRESSOR  Take 0.5 tablets (25 mg total) by mouth 2 (two) times daily.     multivitamin per tablet  Take 1 tablet by mouth daily.     nitroGLYCERIN 0.4 MG/SPRAY spray  Commonly known as:  NITROLINGUAL  Place 1 spray under the tongue every 5 (five) minutes x 3 doses as needed. Chest pain     simvastatin 40 MG tablet  Commonly known as:  ZOCOR  Take 1 tablet (40 mg total) by mouth at bedtime.     traMADol 50 MG tablet  Commonly known as:  ULTRAM     warfarin 2 MG tablet  Commonly known as:  COUMADIN  TAKE AS DIRECTED           Objective:   Physical  Exam BP 126/72 mmHg  Pulse 73  Temp(Src) 97.6 F (36.4 C) (Oral)  Ht 5\' 7"  (1.702 m)  Wt 159 lb 6 oz (72.292 kg)  BMI 24.96 kg/m2  SpO2 97% General:   Well developed, well nourished . NAD.  HEENT:  Normocephalic . Face symmetric, atraumatic Lungs:  CTA B Normal respiratory effort, no intercostal retractions, no accessory muscle use. Heart: irregular.  No pretibial edema bilaterally  Skin: Not pale. Not jaundice Neurologic:  alert & oriented X3.  Speech normal, gait appropriate for age and unassisted Psych--  Cognition and judgment appear intact.  Cooperative with normal attention span and concentration.  Behavior appropriate. No anxious or depressed appearing.      Assessment & Plan:   Chest pain, shortness of breath Since the last time she was here, workup was done by Dr. Ronnald Ramp, it came back okay. Also, she saw cardiology, symptoms felt not to be cardiac. Recommend observation  Continue with diarrhea To see GI soon.  Depression On Lexapro, things seem to be doing okay.

## 2014-12-17 NOTE — Progress Notes (Signed)
Pre visit review using our clinic review tool, if applicable. No additional management support is needed unless otherwise documented below in the visit note. 

## 2014-12-30 ENCOUNTER — Ambulatory Visit (INDEPENDENT_AMBULATORY_CARE_PROVIDER_SITE_OTHER): Payer: Medicare Other | Admitting: General Practice

## 2014-12-30 DIAGNOSIS — Z1231 Encounter for screening mammogram for malignant neoplasm of breast: Secondary | ICD-10-CM | POA: Diagnosis not present

## 2014-12-30 DIAGNOSIS — Z5181 Encounter for therapeutic drug level monitoring: Secondary | ICD-10-CM

## 2014-12-30 LAB — HM MAMMOGRAPHY

## 2014-12-30 LAB — POCT INR: INR: 2.8

## 2014-12-30 NOTE — Progress Notes (Signed)
Pre visit review using our clinic review tool, if applicable. No additional management support is needed unless otherwise documented below in the visit note. 

## 2014-12-30 NOTE — Progress Notes (Signed)
I have reviewed and agree with the plan. 

## 2015-01-05 DIAGNOSIS — M1712 Unilateral primary osteoarthritis, left knee: Secondary | ICD-10-CM | POA: Diagnosis not present

## 2015-01-13 ENCOUNTER — Ambulatory Visit (INDEPENDENT_AMBULATORY_CARE_PROVIDER_SITE_OTHER): Payer: Medicare Other | Admitting: Gastroenterology

## 2015-01-13 ENCOUNTER — Encounter: Payer: Self-pay | Admitting: Gastroenterology

## 2015-01-13 VITALS — BP 114/80 | HR 88 | Ht 65.0 in | Wt 162.4 lb

## 2015-01-13 DIAGNOSIS — G8929 Other chronic pain: Secondary | ICD-10-CM

## 2015-01-13 DIAGNOSIS — R159 Full incontinence of feces: Secondary | ICD-10-CM

## 2015-01-13 DIAGNOSIS — R1031 Right lower quadrant pain: Secondary | ICD-10-CM | POA: Diagnosis not present

## 2015-01-13 DIAGNOSIS — R197 Diarrhea, unspecified: Secondary | ICD-10-CM | POA: Diagnosis not present

## 2015-01-13 DIAGNOSIS — R1032 Left lower quadrant pain: Secondary | ICD-10-CM

## 2015-01-13 MED ORDER — DICYCLOMINE HCL 10 MG PO CAPS: 10.0000 mg | ORAL_CAPSULE | Freq: Three times a day (TID) | ORAL | Status: DC

## 2015-01-13 NOTE — Progress Notes (Addendum)
    History of Present Illness: This is an 81 year old female referred by Colon Branch, MD for the evaluation of urgent post prandial diarrhea, incontinence and lower abdominal pain for 2-3 years. She is accompanied by her daughter. Symptoms occur after every meal with all types of foods. The incontinence has been worsening. In August 2009 she had an attempted colonoscopy. The procedure was incomplete to the sigmoid colon due to a very tortuous and redundant sigmoid colon. In October 2009 she had an attempted air-contrast barium enema however the study was severely limited as patient could not retain the contrast or tolerate air insufflation. Sigmoid colon diverticulosis was noted on both exams. In June of this year she had a normal CBC, normal CMET, negative stool culture, negative C. difficile and negative lactoferrin. Denies weight loss, constipation, change in stool caliber, melena, hematochezia, nausea, vomiting, dysphagia, reflux symptoms, chest pain.  Review of Systems: Pertinent positive and negative review of systems were noted in the above HPI section. All other review of systems were otherwise negative.  Current Medications, Allergies, Past Medical History, Past Surgical History, Family History and Social History were reviewed in [employer] record.  Physical Exam: General: Well developed, well nourished, elderly, no acute distress Head: Normocephalic and atraumatic Eyes:  sclerae anicteric, EOMI Ears: Normal auditory acuity Mouth: No deformity or lesions Neck: Supple, no masses or thyromegaly Lungs: Clear throughout to auscultation Heart: Regular rate and rhythm; no murmurs, rubs or bruits Abdomen: Soft, non tender and non distended. No masses, hepatosplenomegaly or hernias noted. Normal Bowel sounds Rectal: Decreased sphincter tone with decreased anal squeeze, no lesions noted, Hemoccult-negative brown stool in the rectal vault  Musculoskeletal: Symmetrical with  no gross deformities  Skin: No lesions on visible extremities Pulses:  Normal pulses noted Extremities: No clubbing, cyanosis, edema or deformities noted Neurological: Alert oriented x 4, grossly nonfocal Cervical Nodes:  No significant cervical adenopathy Inguinal Nodes: No significant inguinal adenopathy Psychological:  Alert and cooperative. Normal mood and affect  Assessment and Recommendations:  1. Diarrhea-urgent, watery, postprandial, associated with lower abdominal cramping. Suspected irritable bowel syndrome. Rule out celiac disease, mesenteric insufficiency. Check tTG and IgA. Trial of a FODMAP diet for 1-2 weeks. She is not a good candidate for colonoscopy or air-contrast barium enema based on prior history. Plan for trial of dicyclomine 10 mg before meals and Imodium twice daily as needed. Kegle exercise program recommended for pelvic floor muscle strengthening. REV in 6 weeks.   cc: Colon Branch, MD [address] [facility] [address] [place], [place] [address]

## 2015-01-13 NOTE — Patient Instructions (Signed)
We have sent the following medications to your pharmacy for you to pick up at your convenience:Bentyl.  You have been given a Fodmap diet to follow.  You can use over the counter Imodium twice daily as needed.  Your follow up appt with Dr. Fuller Plan is on 02/24/15 at 9:15am.   Thank you for choosing me and [facility].  Pricilla Riffle. Dagoberto Ligas., MD., Marval Regal

## 2015-01-14 ENCOUNTER — Telehealth: Payer: Self-pay

## 2015-01-14 DIAGNOSIS — R197 Diarrhea, unspecified: Secondary | ICD-10-CM

## 2015-01-14 NOTE — Telephone Encounter (Signed)
Spoke with Katharine Look (patient's daughter) and informed her that we need her mom to get more labs drawn in our basement. Also to start kegel exercises to help with fecal incontinence. Offered to print information regarding kegel exercises and mail them to her but she declined. Katharine Look states she will print some info off the Internet and give it her mom. Katharine Look states she will bring her mom to get the labs drawn possible tomorrow or next week.

## 2015-01-15 ENCOUNTER — Other Ambulatory Visit (INDEPENDENT_AMBULATORY_CARE_PROVIDER_SITE_OTHER): Payer: Medicare Other

## 2015-01-15 DIAGNOSIS — R197 Diarrhea, unspecified: Secondary | ICD-10-CM | POA: Diagnosis not present

## 2015-01-15 LAB — IGA: IGA: 199 mg/dL (ref 68–378)

## 2015-01-20 LAB — TISSUE TRANSGLUTAMINASE, IGA: Tissue Transglutaminase Ab, IgA: 1 U/mL (ref <4)

## 2015-01-27 ENCOUNTER — Ambulatory Visit (INDEPENDENT_AMBULATORY_CARE_PROVIDER_SITE_OTHER): Payer: Medicare Other | Admitting: General Practice

## 2015-01-27 DIAGNOSIS — Z5181 Encounter for therapeutic drug level monitoring: Secondary | ICD-10-CM | POA: Diagnosis not present

## 2015-01-27 LAB — POCT INR: INR: 2.6

## 2015-01-27 NOTE — Progress Notes (Signed)
Pre visit review using our clinic review tool, if applicable. No additional management support is needed unless otherwise documented below in the visit note. 

## 2015-01-28 NOTE — Progress Notes (Signed)
I have reviewed and agree with the plan. 

## 2015-02-04 ENCOUNTER — Other Ambulatory Visit: Payer: Self-pay | Admitting: Internal Medicine

## 2015-02-04 ENCOUNTER — Other Ambulatory Visit: Payer: Self-pay | Admitting: Cardiology

## 2015-02-22 ENCOUNTER — Ambulatory Visit: Payer: Medicare Other | Admitting: Internal Medicine

## 2015-02-23 ENCOUNTER — Ambulatory Visit: Payer: Medicare Other | Admitting: Internal Medicine

## 2015-02-24 ENCOUNTER — Encounter: Payer: Self-pay | Admitting: Gastroenterology

## 2015-02-24 ENCOUNTER — Ambulatory Visit (INDEPENDENT_AMBULATORY_CARE_PROVIDER_SITE_OTHER): Payer: Medicare Other | Admitting: Gastroenterology

## 2015-02-24 ENCOUNTER — Ambulatory Visit (INDEPENDENT_AMBULATORY_CARE_PROVIDER_SITE_OTHER): Payer: Medicare Other | Admitting: *Deleted

## 2015-02-24 VITALS — BP 110/72 | HR 68 | Ht 65.0 in | Wt 161.0 lb

## 2015-02-24 DIAGNOSIS — Z5181 Encounter for therapeutic drug level monitoring: Secondary | ICD-10-CM

## 2015-02-24 DIAGNOSIS — K589 Irritable bowel syndrome without diarrhea: Secondary | ICD-10-CM | POA: Diagnosis not present

## 2015-02-24 DIAGNOSIS — R197 Diarrhea, unspecified: Secondary | ICD-10-CM | POA: Diagnosis not present

## 2015-02-24 DIAGNOSIS — Z23 Encounter for immunization: Secondary | ICD-10-CM

## 2015-02-24 LAB — POCT INR: INR: 2.4

## 2015-02-24 MED ORDER — DICYCLOMINE HCL 10 MG PO CAPS: 10.0000 mg | ORAL_CAPSULE | Freq: Three times a day (TID) | ORAL | Status: AC

## 2015-02-24 NOTE — Progress Notes (Signed)
I have reviewed and agree with the plan. 

## 2015-02-24 NOTE — Progress Notes (Signed)
    History of Present Illness: This is an 81 year old female returning for follow-up of diarrhea. She  is accompanied by her neighbor. Patient relates she has had less problems with diarrhea over the past several weeks. It does not appear that she has tried FODMAP diet and Kegel exercises. She has not tried Imodium. She uses Bentyl intermittently and feels probably does help.   Current Medications, Allergies, Past Medical History, Past Surgical History, Family History and Social History were reviewed in [employer] record.  Physical Exam: General: Well developed , well nourished, elderly, no acute distress Head: Normocephalic and atraumatic Eyes:  sclerae anicteric, EOMI Ears: Normal auditory acuity Mouth: No deformity or lesions Lungs: Clear throughout to auscultation Heart: Regular rate and rhythm; no murmurs, rubs or bruits Abdomen: Soft, non tender and non distended. No masses, hepatosplenomegaly or hernias noted. Normal Bowel sounds Musculoskeletal: Symmetrical with no gross deformities  Pulses:  Normal pulses noted Extremities: No clubbing, cyanosis, edema or deformities noted Neurological: Alert oriented x 4, grossly nonfocal. Memory deficits.  Psychological:  Alert and cooperative. Normal mood and affect  Assessment and Recommendations:  1. Intermittent diarrhea, often following meals. Presumably she has IBS-D. Encouraged her to use Bentyl 10 mg 30-60 minutes before meals. Again encouraged her to do recommended Kegel exercises. May use Imodium twice daily as needed. Avoid any foods that trigger diarrhea. Again encouraged a trial of a FODMAP diet but she doesn't seem to have the ability or the interest to follow a strict diet. REV in 1 year. I spent 15 minutes of face-to-face time with the patient. Greater than 50% of the time was spent counseling and coordinating care.

## 2015-02-24 NOTE — Progress Notes (Signed)
Pre visit review using our clinic review tool, if applicable. No additional management support is needed unless otherwise documented below in the visit note. 

## 2015-02-24 NOTE — Patient Instructions (Signed)
We have sent the following medications to your pharmacy for you to pick up at your convenience:Bentyl to take one tablet by mouth before meals.  You have been given information regarding Kegel exercises.   You have been given your flu shot.   Thank you for choosing me and [facility].  Pricilla Riffle. Dagoberto Ligas., MD., Marval Regal

## 2015-03-01 DIAGNOSIS — M1712 Unilateral primary osteoarthritis, left knee: Secondary | ICD-10-CM | POA: Diagnosis not present

## 2015-03-08 DIAGNOSIS — M1712 Unilateral primary osteoarthritis, left knee: Secondary | ICD-10-CM | POA: Diagnosis not present

## 2015-03-14 DIAGNOSIS — S0990XA Unspecified injury of head, initial encounter: Secondary | ICD-10-CM | POA: Diagnosis not present

## 2015-03-14 DIAGNOSIS — R911 Solitary pulmonary nodule: Secondary | ICD-10-CM | POA: Diagnosis not present

## 2015-03-14 DIAGNOSIS — S20212A Contusion of left front wall of thorax, initial encounter: Secondary | ICD-10-CM | POA: Diagnosis not present

## 2015-03-14 DIAGNOSIS — R42 Dizziness and giddiness: Secondary | ICD-10-CM | POA: Diagnosis not present

## 2015-03-14 DIAGNOSIS — R109 Unspecified abdominal pain: Secondary | ICD-10-CM | POA: Diagnosis not present

## 2015-03-14 DIAGNOSIS — S161XXA Strain of muscle, fascia and tendon at neck level, initial encounter: Secondary | ICD-10-CM | POA: Diagnosis not present

## 2015-03-14 DIAGNOSIS — M542 Cervicalgia: Secondary | ICD-10-CM | POA: Diagnosis not present

## 2015-03-14 DIAGNOSIS — S3991XA Unspecified injury of abdomen, initial encounter: Secondary | ICD-10-CM | POA: Diagnosis not present

## 2015-03-15 DIAGNOSIS — M1712 Unilateral primary osteoarthritis, left knee: Secondary | ICD-10-CM | POA: Diagnosis not present

## 2015-03-22 ENCOUNTER — Encounter: Payer: Self-pay | Admitting: Family

## 2015-03-22 ENCOUNTER — Ambulatory Visit (HOSPITAL_BASED_OUTPATIENT_CLINIC_OR_DEPARTMENT_OTHER)
Admission: RE | Admit: 2015-03-22 | Discharge: 2015-03-22 | Disposition: A | Discharge status: Home / Self Care | Payer: Medicare Other | Source: Ambulatory Visit | Attending: Family | Admitting: Family

## 2015-03-22 ENCOUNTER — Ambulatory Visit (INDEPENDENT_AMBULATORY_CARE_PROVIDER_SITE_OTHER): Payer: Medicare Other | Admitting: Family

## 2015-03-22 VITALS — BP 122/78 | HR 102 | Temp 98.0°F | Resp 18 | Ht 67.0 in

## 2015-03-22 DIAGNOSIS — J9811 Atelectasis: Secondary | ICD-10-CM | POA: Diagnosis not present

## 2015-03-22 DIAGNOSIS — R0781 Pleurodynia: Secondary | ICD-10-CM

## 2015-03-22 DIAGNOSIS — R2681 Unsteadiness on feet: Secondary | ICD-10-CM

## 2015-03-22 DIAGNOSIS — R0789 Other chest pain: Secondary | ICD-10-CM | POA: Diagnosis not present

## 2015-03-22 NOTE — Progress Notes (Signed)
Pre visit review using our clinic review tool, if applicable. No additional management support is needed unless otherwise documented below in the visit note. 

## 2015-03-22 NOTE — Progress Notes (Signed)
Subjective:     Patient ID: Virginia Price, female   DOB: [birth year], 81 y.o.   MRN: [id number]  HPI  Ms. Rudy is an 81 yr old female who presents today with chief complaint of left sided rib pain.  She reports that 8 days ago she stood up next to the bed and fell back against the night stand. She reports that she hit her head and her left side. She was evaluated in the ED at [facility] and per patient and daughter, she underwent CT head and imaging of her ribs/chest which was all reportedly negative.    She presents today because of severe left sided rib pain.  Pain is worst with with inspiration or cough.  Denies any headache or hip pain.  Per daughter, patient has spent most of the week in bed due to the pain and has not been using the pain meds she has on hand. Pt reports that she is scared that she may fall if she walks from her bed to the kitchen.  Pain meds are in her kitchen.    Daughter reports frustration because her mother apparently has resisted their attempts to get her extra help. Pt lives alone.  Pt has not been interested in assisted living and per daughter refused a life alert service.    Review of Systems  Genitourinary: Negative for dysuria and frequency.      Past Medical History  Diagnosis Date  . Atrial fibrillation (HCC)     Coumadin therapy, dx via  Holter monitor, March, 2012, atrial fib heart rate ranges, no more bradycardia or tachycardia while on digoxin  . Chest pain     Catheterization normal 2004, /  nuclear 2006 no ischemia  . Hyperlipidemia   . Hypertension   . Stroke [facility])     (10-03 L MCA territory)  . Allergic rhinitis   . GERD (gastroesophageal reflux disease)   . Diverticular disease   . Colon polyp   . Abnormal chest CT     "innumerable small nodules  . Malignant melanoma ([place])   . Memory loss   . Restless leg syndrome   . CHF (congestive heart failure) ([place])     Diastolic with fluid overload, normal systolic function,, July, 2012.   Atrial fibrillation  . Mitral regurgitation     Mild to moderate, echo, January, 2012  . Erosive esophagitis   . Esophageal stricture   . Fibromyalgia     question of  . Diverticulosis     Social History   Social History  . Marital Status: Widowed    Spouse Name: N/A  . Number of Children: 3  . Years of Education: N/A   Occupational History  . retired     Social History Main Topics  . Smoking status: Never Smoker   . Smokeless tobacco: Never Used  . Alcohol Use: No  . Drug Use: No  . Sexual Activity: Not on file   Other Topics Concern  . Not on file   Social History Narrative   Lives by herself, 1 daughter in [place], 1 son in [place], 1 daughter in [place]     Her neighbor very close to her Vaughan Basta)    Contact ---Katharine Look (daughter)  [phone]    Past Surgical History  Procedure Laterality Date  . Cholecystectomy  1991  . Radical hysterectomy  1978    hyst and B oophorectomy  . Cataract extraction Bilateral   . Appendectomy    . Mohs surgery  03/2005    Family History  Problem Relation Age of Onset  . Heart disease Mother   . Colon polyps Mother   . Diabetes Mother   . Heart disease Father   . Diabetes Father   . Breast cancer Neg Hx   . Colon cancer Neg Hx     Allergies  Allergen Reactions  . Ciprofloxacin Hives  . Penicillins Rash  . Sulfamethoxazole-Trimethoprim Rash  . Sulfonamide Derivatives Rash  . [name] [Ethacrynic Acid] Other (See Comments)    UNKNOWN REACTION  . Hydrocodone-Acetaminophen Nausea Only  . Metronidazole Other (See Comments)    UNKNOWN REACTION  . Prednisone Other (See Comments)    UNKNOWN REACTION  . Tramadol Hcl Other (See Comments)    Sleepy    Current Outpatient Prescriptions on File Prior to Visit  Medication Sig Dispense Refill  . albuterol (PROVENTIL HFA;VENTOLIN HFA) 108 (90 BASE) MCG/ACT inhaler Inhale 2 puffs into the lungs every 6 (six) hours as needed for wheezing or shortness of breath. 1 Inhaler 2   . calcium-vitamin D (OSCAL) 250-125 MG-UNIT per tablet Take 1 tablet by mouth daily.      . cephALEXin (KEFLEX) 250 MG capsule Take 250 mg by mouth daily. Daily for UTI prevention    . dicyclomine (BENTYL) 10 MG capsule Take 1 capsule (10 mg total) by mouth 3 (three) times daily before meals. 90 capsule 11  . escitalopram (LEXAPRO) 10 MG tablet Take 1 tablet (10 mg total) by mouth daily. 30 tablet 6  . fluticasone (FLONASE) 50 MCG/ACT nasal spray Place 2 sprays into the nose as needed for allergies or rhinitis.     . furosemide (LASIX) 40 MG tablet Take 0.5 tablets (20 mg total) by mouth daily. 15 tablet 6  . metoprolol (LOPRESSOR) 50 MG tablet Take 0.5 tablets (25 mg total) by mouth 2 (two) times daily. 90 tablet 1  . multivitamin (THERAGRAN) per tablet Take 1 tablet by mouth daily.      . nitroGLYCERIN (NITROLINGUAL) 0.4 MG/SPRAY spray Place 1 spray under the tongue every 5 (five) minutes x 3 doses as needed. Chest pain    . simvastatin (ZOCOR) 40 MG tablet Take 1 tablet (40 mg total) by mouth at bedtime. 90 tablet 1  . VOLTAREN 1 % GEL Apply 1 application topically 4 (four) times daily.     Marland Kitchen warfarin (COUMADIN) 2 MG tablet Take as directed by [facility]. 30 tablet 3  . [DISCONTINUED] potassium chloride (K-DUR) 10 MEQ tablet Take 1 tablet (10 mEq total) by mouth 2 (two) times daily. 30 tablet 1   No current facility-administered medications on file prior to visit.    BP 122/78 mmHg  Pulse 102  Temp(Src) 98 F (36.7 C) (Oral)  Resp 18  Ht 5\' 7"  (1.702 m)  Wt   SpO2     Objective:   Physical Exam  Constitutional: She appears well-developed and well-nourished.  Cardiovascular: Normal rate, regular rhythm and normal heart sounds.   No murmur heard. Pulmonary/Chest: Effort normal and breath sounds normal. No respiratory distress. She has no wheezes.  Musculoskeletal:  + tenderness to palpation left lateral rib cage.   Skin:  + echymosis left lateral rib cage.   Psychiatric:  She has a normal mood and affect. Her behavior is normal. Judgment and thought content normal.       Assessment:         Plan:    Left rib pain- will obtain CXR with left sided rib detail to  re-evaluate for fracture given severity of pain. Will request records from [facility] as well. She tells me that they did send a urine specimen during her ED visit.  Also advised pt:  For pain, you may use tramadol PRN.  If tramadol does not help you pain, please use hydrocodone PRN.  Call if pain worsens, if you develop fever or if symptoms are not improved in 1 week.  Gait instability- we discussed referral for home health PT for gait training. She is agreeable.  I also encouraged her to think about getting some additional help and/or considering an assisted living facility as it seems as though she is beginning to need more help at home.   Pt reports that her flu shot is up to date this season.

## 2015-03-22 NOTE — Patient Instructions (Addendum)
Please complete x ray on the first floor. For pain, you may use tramadol.  If tramadol does not help you pain, please use hydrocodone. Call if pain worsens, if you develop fever or if symptoms are not improved in 1 week. Consider getting some additional help at home or looking into assisted living.

## 2015-03-23 ENCOUNTER — Telehealth: Payer: Self-pay | Admitting: Internal Medicine

## 2015-03-23 NOTE — Telephone Encounter (Signed)
I received records from [facility]. CT head no acute CT chest: Multiple tiny nonspecific pulmonary nodules, there is one that is 8 mm at the RUL. On reviewing the chart, this is a well known issue for the patient. No further eval is needed

## 2015-03-24 ENCOUNTER — Ambulatory Visit (INDEPENDENT_AMBULATORY_CARE_PROVIDER_SITE_OTHER): Payer: Medicare Other | Admitting: General Practice

## 2015-03-24 DIAGNOSIS — Z5181 Encounter for therapeutic drug level monitoring: Secondary | ICD-10-CM | POA: Diagnosis not present

## 2015-03-24 LAB — POCT INR: INR: 2

## 2015-03-24 NOTE — Progress Notes (Signed)
Pre visit review using our clinic review tool, if applicable. No additional management support is needed unless otherwise documented below in the visit note. 

## 2015-03-25 NOTE — Progress Notes (Signed)
I have reviewed and agree with the plan. 

## 2015-03-31 ENCOUNTER — Telehealth: Payer: Self-pay | Admitting: Internal Medicine

## 2015-03-31 DIAGNOSIS — R269 Unspecified abnormalities of gait and mobility: Secondary | ICD-10-CM

## 2015-03-31 DIAGNOSIS — R296 Repeated falls: Secondary | ICD-10-CM

## 2015-03-31 NOTE — Telephone Encounter (Signed)
We could try Lidoderm patch to apply locally daily. May help. Another option would be a referral to pain management, sometimes they can do a local injection. If she is interested on any of these options let me know

## 2015-03-31 NOTE — Telephone Encounter (Signed)
Pt daughter, Katharine Look, wanted to notify Dr. Larose Kells that the pt has fallen again. This is the 3rd time in about 1 month. She did not get hurt this time but wanted to make him aware.

## 2015-03-31 NOTE — Telephone Encounter (Signed)
Advise patient, I recommend formal physical therapy --- dx  gait disorder, frequent falls. Please arrange

## 2015-03-31 NOTE — Telephone Encounter (Signed)
FYI. Pt seen by Melissa on 03/22/2015.

## 2015-03-31 NOTE — Telephone Encounter (Signed)
Spoke with Pt's daughter, Virginia Price, she informed me that she has spoke with PT at one of the [place] locations and the co-pay would be 40 dollars each visit which the Pt can not afford. Virginia Price also wanted to inform that Pt is still in a lot of pain in ribs from 03/22/2015 OV. X-rays of ribs showed no fracture. Pt has been taking Tramadol and Hydrocodone but does not help with pain. Virginia Price wanted to know if any other medication can be prescribed. Informed her I would send message back to Dr. Larose Kells and see what else he suggests.

## 2015-04-01 MED ORDER — LIDOCAINE 5 % EX PTCH: 1.0000 | MEDICATED_PATCH | CUTANEOUS | Status: DC

## 2015-04-01 NOTE — Telephone Encounter (Signed)
[facility] referral placed.  

## 2015-04-01 NOTE — Telephone Encounter (Signed)
Spoke with Pt's daughter Virginia Price, recommendations given and we have decided on the Lidoderm patches before pain management referral. Per Dr. Larose Kells: Lidoderm 5% patches 1 patch daily, [facility]. Informed Pt's daughter further investigation will need to be done in regards to another way for PT. Virginia Price verbalized understanding. Spoke with Dr. Larose Kells recommended home health PT or [facility] referral. We elected to try [facility] referral first for gait training.

## 2015-04-05 NOTE — Patient Outreach (Signed)
[facility]) Care Management  04/05/2015  Virginia Price 01-13-1926 [id number]   [facility] Quinn Plowman RN.  Thank you  [facility] [place] Management Assistant

## 2015-04-05 NOTE — Telephone Encounter (Signed)
PA initiated. Awaiting determination. JG//CMA 

## 2015-04-06 NOTE — Telephone Encounter (Signed)
PA approved through 05/28/2016. Approval letter sent for scanning. JG//CMA

## 2015-04-19 ENCOUNTER — Other Ambulatory Visit: Payer: Self-pay

## 2015-04-19 NOTE — Patient Outreach (Signed)
[facility]) Care Management  04/19/2015  CHAVIVA DEFENBAUGH 17-Jul-1925 [id number]  SUBJECTIVE:  Telephone call to patient.  Contact made with Daughter and caregiver/ Lucilla Edin.  Daughter listed in EPIC as DPR.  Daughter states she is patients durable power of attorney.  RNCM discussed and offered [facility] care management services to patient.  Daughter states patient is currently receiving services from [facility] 3 days a week for 3 hours a day.  Daughter refused [facility] care management services at this time.  Daughter states she doesn't think services with [facility] care management are needed currently. Daughter states patient has a follow up appointment with Dr. Larose Kells on tomorrow 04/20/15.  RNCM requested daughter to discuss [facility] care management services further with Dr. Larose Kells for additional information regarding services.   Daughter verbally agreed to receive [facility] care management outreach letter and brochure for future needs.   PLAN:  RNCM will refer patient to [facility] to close due to caregiver refusal of services.  RNCM will notify patients primary MD of refusal.  RNCM will send patient/caregiver [facility] care management outreach letter and brochure.   Quinn Plowman RN,BSN,CCM [place] Coordinator [phone]

## 2015-04-20 ENCOUNTER — Ambulatory Visit (INDEPENDENT_AMBULATORY_CARE_PROVIDER_SITE_OTHER): Payer: Medicare Other | Admitting: Internal Medicine

## 2015-04-20 ENCOUNTER — Encounter: Payer: Self-pay | Admitting: Internal Medicine

## 2015-04-20 VITALS — BP 112/74 | HR 82 | Temp 98.2°F | Ht 67.0 in | Wt 163.5 lb

## 2015-04-20 DIAGNOSIS — I10 Essential (primary) hypertension: Secondary | ICD-10-CM | POA: Diagnosis not present

## 2015-04-20 DIAGNOSIS — L299 Pruritus, unspecified: Secondary | ICD-10-CM | POA: Diagnosis not present

## 2015-04-20 DIAGNOSIS — R0781 Pleurodynia: Secondary | ICD-10-CM | POA: Diagnosis not present

## 2015-04-20 DIAGNOSIS — R296 Repeated falls: Secondary | ICD-10-CM

## 2015-04-20 DIAGNOSIS — Z09 Encounter for follow-up examination after completed treatment for conditions other than malignant neoplasm: Secondary | ICD-10-CM | POA: Insufficient documentation

## 2015-04-20 LAB — BASIC METABOLIC PANEL
BUN: 15 mg/dL (ref 6–23)
CO2: 27 mEq/L (ref 19–32)
Calcium: 9.5 mg/dL (ref 8.4–10.5)
Chloride: 108 mEq/L (ref 96–112)
Creatinine, Ser: 0.87 mg/dL (ref 0.40–1.20)
GFR: 65.47 mL/min (ref >60.00)
GLUCOSE: 92 mg/dL (ref 70–99)
POTASSIUM: 5.3 meq/L — AB (ref 3.5–5.1)
SODIUM: 142 meq/L (ref 135–145)

## 2015-04-20 NOTE — Progress Notes (Signed)
Subjective:    Patient ID: Virginia Price, female    DOB: Nov 15, 1925, 81 y.o.   MRN: [id number]  DOS:  04/20/2015 Type of visit - description : Routine visit, here with 2 of her daughters Interval history: Recently seen with diarrhea, saw GI, note reviewed, felt to be IBS Continue with pain at the left side of the chest after a fall last month. Did not get a lidocaine patch due to cost. Labs reviewed, due for a BMP Medications reviewed, good compliance. Complaining about generalized itching.   Review of Systems  No fever, chills. No rash Not taking any new medicines Past Medical History  Diagnosis Date  . Atrial fibrillation (HCC)     Coumadin therapy, dx via  Holter monitor, March, 2012, atrial fib heart rate ranges, no more bradycardia or tachycardia while on digoxin  . Chest pain     Catheterization normal 2004, /  nuclear 2006 no ischemia  . Hyperlipidemia   . Hypertension   . Stroke [facility])     (10-03 L MCA territory)  . Allergic rhinitis   . GERD (gastroesophageal reflux disease)   . Diverticular disease   . Colon polyp   . Abnormal chest CT     "innumerable small nodules  . Malignant melanoma ([place])   . Memory loss   . Restless leg syndrome   . CHF (congestive heart failure) ([place])     Diastolic with fluid overload, normal systolic function,, July, 2012.  Atrial fibrillation  . Mitral regurgitation     Mild to moderate, echo, January, 2012  . Erosive esophagitis   . Esophageal stricture   . Fibromyalgia     question of  . Diverticulosis     Past Surgical History  Procedure Laterality Date  . Cholecystectomy  1991  . Radical hysterectomy  1978    hyst and B oophorectomy  . Cataract extraction Bilateral   . Appendectomy    . Mohs surgery      03/2005    Social History   Social History  . Marital Status: Widowed    Spouse Name: N/A  . Number of Children: 3  . Years of Education: N/A   Occupational History  . retired     Social History Main  Topics  . Smoking status: Never Smoker   . Smokeless tobacco: Never Used  . Alcohol Use: No  . Drug Use: No  . Sexual Activity: Not on file   Other Topics Concern  . Not on file   Social History Narrative   Lives by herself, 1 daughter in [place], 1 son in [place], 1 daughter in [place]     Her neighbor very close to her Vaughan Basta)    Contact ---Katharine Look (daughter)  [phone]        Medication List       This list is accurate as of: 04/20/15  1:45 PM.  Always use your most recent med list.               albuterol 108 (90 BASE) MCG/ACT inhaler  Commonly known as:  PROVENTIL HFA;VENTOLIN HFA  Inhale 2 puffs into the lungs every 6 (six) hours as needed for wheezing or shortness of breath.     calcium-vitamin D 250-125 MG-UNIT tablet  Commonly known as:  OSCAL  Take 1 tablet by mouth daily.     cephALEXin 250 MG capsule  Commonly known as:  KEFLEX  Take 250 mg by mouth daily. Daily for UTI prevention  dicyclomine 10 MG capsule  Commonly known as:  BENTYL  Take 1 capsule (10 mg total) by mouth 3 (three) times daily before meals.     escitalopram 10 MG tablet  Commonly known as:  LEXAPRO  Take 1 tablet (10 mg total) by mouth daily.     fluticasone 50 MCG/ACT nasal spray  Commonly known as:  FLONASE  Place 2 sprays into the nose as needed for allergies or rhinitis.     furosemide 40 MG tablet  Commonly known as:  LASIX  Take 0.5 tablets (20 mg total) by mouth daily.     lidocaine 5 %  Commonly known as:  LIDODERM  Place 1 patch onto the skin daily. Remove & Discard patch within 12 hours or as directed by MD     metoprolol 50 MG tablet  Commonly known as:  LOPRESSOR  Take 0.5 tablets (25 mg total) by mouth 2 (two) times daily.     multivitamin per tablet  Take 1 tablet by mouth daily.     nitroGLYCERIN 0.4 MG/SPRAY spray  Commonly known as:  NITROLINGUAL  Place 1 spray under the tongue every 5 (five) minutes x 3 doses as needed. Chest pain     simvastatin  40 MG tablet  Commonly known as:  ZOCOR  Take 1 tablet (40 mg total) by mouth at bedtime.     VOLTAREN 1 % Gel  Generic drug:  diclofenac sodium  Apply 1 application topically 4 (four) times daily.     warfarin 2 MG tablet  Commonly known as:  COUMADIN  Take as directed by Coumadin Clinic.           Objective:   Physical Exam BP 112/74 mmHg  Pulse 82  Temp(Src) 98.2 F (36.8 C) (Oral)  Ht 5\' 7"  (1.702 m)  Wt 163 lb 8 oz (74.163 kg)  BMI 25.60 kg/m2  SpO2 97% General:   Well developed, well nourished . NAD.  HEENT:  Normocephalic . Face symmetric, atraumatic Lungs:  CTA B Normal respiratory effort, no intercostal retractions, no accessory  muscle use. Chest wall, slightly TTP at the lateral left side Heart: RRR,  no murmur.  No pretibial edema bilaterally  Skin: Not pale. Not jaundice.   wrists, hands and interdigital space normal. Lower extremities: No rash, skin is very dry Neurologic:  alert & oriented X3.  Speech normal Psych--  Cognition and judgment appear intact.  Cooperative with normal attention span and concentration.  Behavior appropriate. No anxious or depressed appearing.      Assessment & Plan:   Assessment> HTN Hyperlipidemia GI: GERD Diarrhea--> , IBS? 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 Fibromyalgia  PLAN HTN: well-controlled, check a BMP Diarrhea: Saw GI, probably IBS. Fall 02-2015, injured the left side of the chest, went to [facility], CT head - CT chest ---> no acute. Subsequent x-ray at this office show no rib  refracture. She continue with pain,  Lidoderm patches very expensive, recommend to get the OTC lidocaine patches Generalized itching: Skin examination normal, recent LFTs normal, she does have a dry skin. Recommend Aveeno moisturizing Primary care: Information about end-of-life care provider. RTC 4 months Today, I spent  more than 25  min with the patient: >50% of the time counseling regards her multiple medical problems from the patient and her 2 daughters. They were answered to the best of my ability after reviewing the chart.

## 2015-04-20 NOTE — Assessment & Plan Note (Signed)
HTN: well-controlled, check a BMP Diarrhea: Saw GI, probably IBS. Fall 02-2015, injured the left side of the chest, went to [facility] ER, CT head - CT chest ---> no acute. Subsequent x-ray at this office show no rib  refracture. She continue with pain, Lidoderm patches very expensive, recommend to get the OTC lidocaine patches Generalized itching: Skin examination normal, recent LFTs normal, she does have a dry skin. Recommend Aveeno moisturizing Primary care: Information about end-of-life care provider. RTC 4 months

## 2015-04-20 NOTE — Patient Instructions (Addendum)
Get your blood work before you leave    Use lidocaine patch OTC as needed    Please consider visit these websites for more information:  www.begintheconversation.org  theconversationproject.org   Next visit  for a  checkup in 4 months, no fasting    (25 inutes) Please schedule an appointment at the front desk

## 2015-04-20 NOTE — Progress Notes (Signed)
Pre visit review using our clinic review tool, if applicable. No additional management support is needed unless otherwise documented below in the visit note. 

## 2015-04-27 ENCOUNTER — Other Ambulatory Visit: Payer: Self-pay | Admitting: Internal Medicine

## 2015-04-27 MED ORDER — ESCITALOPRAM OXALATE 10 MG PO TABS: 10.0000 mg | ORAL_TABLET | Freq: Every day | ORAL | Status: AC

## 2015-04-27 NOTE — Telephone Encounter (Signed)
[facility]   Can be reached: [phone]   Reason for call: Insurance requiring a 90 day prescription for escitalopram (LEXAPRO) 10 MG tablet [id number]

## 2015-04-27 NOTE — Telephone Encounter (Signed)
Pt's next f/u is 08/31/15. Refills sent.

## 2015-05-05 ENCOUNTER — Ambulatory Visit: Payer: Medicare Other

## 2015-05-07 DIAGNOSIS — M1712 Unilateral primary osteoarthritis, left knee: Secondary | ICD-10-CM | POA: Diagnosis not present

## 2015-05-12 ENCOUNTER — Ambulatory Visit: Payer: Medicare Other | Admitting: Cardiology

## 2015-05-12 ENCOUNTER — Ambulatory Visit (INDEPENDENT_AMBULATORY_CARE_PROVIDER_SITE_OTHER): Payer: Medicare Other | Admitting: General Practice

## 2015-05-12 DIAGNOSIS — Z5181 Encounter for therapeutic drug level monitoring: Secondary | ICD-10-CM

## 2015-05-12 LAB — POCT INR: INR: 2

## 2015-05-12 NOTE — Progress Notes (Signed)
I have reviewed and agree with the plan. 

## 2015-05-12 NOTE — Progress Notes (Signed)
Pre visit review using our clinic review tool, if applicable. No additional management support is needed unless otherwise documented below in the visit note. 

## 2015-06-03 ENCOUNTER — Encounter: Payer: Self-pay | Admitting: Cardiology

## 2015-06-03 ENCOUNTER — Ambulatory Visit (INDEPENDENT_AMBULATORY_CARE_PROVIDER_SITE_OTHER): Payer: Medicare Other | Admitting: Cardiology

## 2015-06-03 VITALS — BP 118/80 | HR 87 | Ht 65.0 in | Wt 162.8 lb

## 2015-06-03 DIAGNOSIS — I951 Orthostatic hypotension: Secondary | ICD-10-CM | POA: Insufficient documentation

## 2015-06-03 DIAGNOSIS — I481 Persistent atrial fibrillation: Secondary | ICD-10-CM

## 2015-06-03 DIAGNOSIS — Z7901 Long term (current) use of anticoagulants: Secondary | ICD-10-CM

## 2015-06-03 DIAGNOSIS — I4819 Other persistent atrial fibrillation: Secondary | ICD-10-CM | POA: Insufficient documentation

## 2015-06-03 DIAGNOSIS — I5033 Acute on chronic diastolic (congestive) heart failure: Secondary | ICD-10-CM | POA: Diagnosis not present

## 2015-06-03 MED ORDER — FUROSEMIDE 20 MG PO TABS: 20.0000 mg | ORAL_TABLET | Freq: Every day | ORAL | Status: AC

## 2015-06-03 NOTE — Patient Instructions (Signed)
Medication Instructions:   INCREASE YOUR LASIX TO 40 MG (TAKE 2 OF YOUR 20 MG TABLETS TO EQUAL THIS) FOR 2 DAYS ONLY, THEN TAKE 20 MG ONCE DAILY THEREAFTER    Follow-Up:  Your physician wants you to follow-up in:  [place] will receive a reminder letter in the mail two months in advance. If you don't receive a letter, please call our office to schedule the follow-up appointment.      If you need a refill on your cardiac medications before your next appointment, please call your pharmacy.

## 2015-06-03 NOTE — Progress Notes (Signed)
Patient ID: Virginia Price, female   DOB: [birth year], 82 y.o.   MRN: [id number]     CARDIOLOGY OFFICE NOTE  Date:  06/03/2015    Virginia Price Date of Birth: 1926-05-12 Medical Record [id number]  PCP:  Kathlene November, MD  Cardiologist:  Dr Meda Coffee    Chief complain: LE edema  History of Present Illness: Virginia Price is a 83 y.o. female, prior patient of Dr Ron Parker, who presents today for 6 months follow up, she complains of LE edema that has worsened in the last couple of weeks. She denies chest pain, no DOE but she is minimally active. She has been experiencing orthostatic hypotension with 4 falls in the last 2 months, no syncope. Occasional heart flutter with no dizziness and no chest pain.  She has chronic atrial fib, on chronic coumadin. Normal coronaries per remote cath from 2004. Negative Myoview in 2006. Other issues include prior CVA, HTN, HLD and advanced age.  Past Medical History  Diagnosis Date  . Atrial fibrillation (HCC)     Coumadin therapy, dx via  Holter monitor, March, 2012, atrial fib heart rate ranges, no more bradycardia or tachycardia while on digoxin  . Chest pain     Catheterization normal 2004, /  nuclear 2006 no ischemia  . Hyperlipidemia   . Hypertension   . Stroke [facility])     (10-03 L MCA territory)  . Allergic rhinitis   . GERD (gastroesophageal reflux disease)   . Diverticular disease   . Colon polyp   . Abnormal chest CT     "innumerable small nodules  . Malignant melanoma ([place])   . Memory loss   . Restless leg syndrome   . CHF (congestive heart failure) ([place])     Diastolic with fluid overload, normal systolic function,, July, 2012.  Atrial fibrillation  . Mitral regurgitation     Mild to moderate, echo, January, 2012  . Erosive esophagitis   . Esophageal stricture   . Fibromyalgia     question of  . Diverticulosis    Past Surgical History  Procedure Laterality Date  . Cholecystectomy  1991  . Radical hysterectomy  1978    hyst and B  oophorectomy  . Cataract extraction Bilateral   . Appendectomy    . Mohs surgery      03/2005   Medications: Current Outpatient Prescriptions  Medication Sig Dispense Refill  . albuterol (PROVENTIL HFA;VENTOLIN HFA) 108 (90 BASE) MCG/ACT inhaler Inhale 2 puffs into the lungs every 6 (six) hours as needed for wheezing or shortness of breath. 1 Inhaler 2  . calcium-vitamin D (OSCAL) 250-125 MG-UNIT per tablet Take 1 tablet by mouth daily.      . cephALEXin (KEFLEX) 250 MG capsule Take 250 mg by mouth daily. Daily for UTI prevention    . dicyclomine (BENTYL) 10 MG capsule Take 1 capsule (10 mg total) by mouth 3 (three) times daily before meals. 90 capsule 11  . escitalopram (LEXAPRO) 10 MG tablet Take 1 tablet (10 mg total) by mouth daily. 90 tablet 1  . fluticasone (FLONASE) 50 MCG/ACT nasal spray Place 2 sprays into the nose as needed for allergies or rhinitis.     . furosemide (LASIX) 40 MG tablet Take 0.5 tablets (20 mg total) by mouth daily. 15 tablet 6  . meclizine (ANTIVERT) 25 MG tablet Take 1 tablet by mouth 2 (two) times daily.  0  . metoprolol (LOPRESSOR) 50 MG tablet Take 0.5 tablets (25 mg total) by mouth 2 (  two) times daily. 90 tablet 1  . multivitamin (THERAGRAN) per tablet Take 1 tablet by mouth daily.      . nitroGLYCERIN (NITROLINGUAL) 0.4 MG/SPRAY spray Place 1 spray under the tongue every 5 (five) minutes x 3 doses as needed. Chest pain    . simvastatin (ZOCOR) 40 MG tablet Take 1 tablet (40 mg total) by mouth at bedtime. 90 tablet 1  . VOLTAREN 1 % GEL Apply 1 application topically 4 (four) times daily.     Marland Kitchen warfarin (COUMADIN) 2 MG tablet Take as directed by [facility] Clinic. 30 tablet 3  . [DISCONTINUED] potassium chloride (K-DUR) 10 MEQ tablet Take 1 tablet (10 mEq total) by mouth 2 (two) times daily. 30 tablet 1   No current facility-administered medications for this visit.    Allergies: Allergies  Allergen Reactions  . Ciprofloxacin Hives  . Penicillins Rash  .  Sulfamethoxazole-Trimethoprim Rash  . Sulfonamide Derivatives Rash  . [name] [Ethacrynic Acid] Other (See Comments)    UNKNOWN REACTION  . Hydrocodone-Acetaminophen Nausea Only  . Metronidazole Other (See Comments)    UNKNOWN REACTION  . Prednisone Other (See Comments)    UNKNOWN REACTION  . Tramadol Hcl Other (See Comments)    Sleepy   Social History: The patient  reports that she has never smoked. She has never used smokeless tobacco. She reports that she does not drink alcohol or use illicit drugs.   Family History: The patient's family history includes Colon polyps in her mother; Diabetes in her father and mother; Heart disease in her father and mother. There is no history of Breast cancer or Colon cancer.   Review of Systems: Please see the history of present illness.   Otherwise, the review of systems is positive for chest pain, DOE, cough, abdominal pain, diarrhea, depression, excessive fatigue, chest pressure, irregular heart beats and wheezing.   All other systems are reviewed and negative.   Physical Exam: VS:  BP 118/80 mmHg  Pulse 87  Ht 5\' 5"  (1.651 m)  Wt 162 lb 12.8 oz (73.846 kg)  BMI 27.09 kg/m2 .  BMI Body mass index is 27.09 kg/(m^2).  Wt Readings from Last 3 Encounters:  06/03/15 162 lb 12.8 oz (73.846 kg)  04/20/15 163 lb 8 oz (74.163 kg)  02/24/15 161 lb (73.029 kg)   BP 118/80 mmHg, HR 87, W 162 lbs General: Pleasant.  HEENT: Normal. Neck: Supple, no JVD, carotid bruits, or masses noted.  Cardiac: Irregular irregular rhythm. Rate ok. No edema noted today.  Respiratory:  mild crackles at the bases. GI: Soft and nontender.  MS: No deformity or atrophy. Gait and ROM intact. She is using a [place].  Skin: Warm and dry. Color is normal.  Neuro:  Strength and sensation are intact and no gross focal deficits noted.  Psych: Alert, appropriate and with normal affect. Mild LE edema   LABORATORY DATA:  EKG:  EKG is not ordered today.   Lab Results    Component Value Date   WBC 7.2 11/26/2014   HGB 13.9 11/26/2014   HCT 42.5 11/26/2014   PLT 193.0 11/26/2014   GLUCOSE 92 04/20/2015   CHOL 226* 06/12/2014   TRIG 221.0* 06/12/2014   HDL 52.60 06/12/2014   LDLDIRECT 144.0 06/12/2014   LDLCALC 96 07/21/2013   ALT 21 11/26/2014   AST 19 11/26/2014   NA 142 04/20/2015   K 5.3* 04/20/2015   CL 108 04/20/2015   CREATININE 0.87 04/20/2015   BUN 15 04/20/2015   CO2  27 04/20/2015   TSH 1.07 10/15/2014   INR 2.0 05/12/2015    BNP (last 3 results)  Recent Labs  06/30/14 1030  BNP 147.7*    ProBNP (last 3 results)  Recent Labs  11/26/14 1529  PROBNP 165.0*     Other Studies Reviewed Today:  Study Conclusions  - Left ventricle: The cavity size was normal. Systolic function was vigorous. The estimated ejection fraction was in the range of 65% to 70%. Wall motion was normal; there were no regional wall motion abnormalities. The study was not technically sufficient to allow evaluation of LV diastolic dysfunction due to atrial fibrillation. - Aortic valve: Trileaflet; mildly thickened, mildly calcified leaflets. Sclerosis without stenosis. Mild regurgitation. - Mitral valve: Mild regurgitation. - Left atrium: The atrium was mildly dilated. - Right ventricle: Systolic function was normal. - Tricuspid valve: Moderate regurgitation. - Pulmonary arteries: Systolic pressure was mildly increased. PA peak pressure: 41mm Hg (S). - Inferior vena cava: The vessel was normal in size. - Pericardium, extracardiac: There was no pericardial effusion.  Assessment/Plan:  1. Acute on chronic diastolic [place] - she is mildly fluid overloaded, she is advised to use lasix 40 mg po daily for the next 2 days, then resume 20 mg po daily.  2. Chronic atrial fibrillation  Rate controlled, on chronic warfarin, no bleeding  3. Chronic anticoagulation - tolerated well  4. Mitral regurgitation - mild   5. Orthostatic  hypotension - precaution only, BP soft for age, she is only on metoprolol 12.5 mg po BID for rate control, no other BP medications.  Signed: Ena Dawley, MD  06/03/2015 11:41 AM  [facility] [address] [place], [place]  [address] Phone: [phone] Fax: [phone]

## 2015-06-22 ENCOUNTER — Ambulatory Visit: Payer: Medicare Other

## 2015-06-23 ENCOUNTER — Ambulatory Visit (INDEPENDENT_AMBULATORY_CARE_PROVIDER_SITE_OTHER): Payer: Medicare Other | Admitting: General Practice

## 2015-06-23 DIAGNOSIS — Z5181 Encounter for therapeutic drug level monitoring: Secondary | ICD-10-CM | POA: Diagnosis not present

## 2015-06-23 LAB — POCT INR: INR: 1.3

## 2015-06-23 NOTE — Progress Notes (Signed)
Pre visit review using our clinic review tool, if applicable. No additional management support is needed unless otherwise documented below in the visit note. INR is low today.  Patient has missed doses.  Daughter is with patient today.  I asked daughter to consider getting pt a pill box and filling it every week.  Encouraged daughter to maybe help make sure patient is taking medication. Reiterated that missing just one dose of medication can drop INR as much as one point in a day.  Also reiterated the risks involved with a low INR.    Daughter and patient verbalized understanding.

## 2015-07-12 ENCOUNTER — Other Ambulatory Visit: Payer: Self-pay | Admitting: Internal Medicine

## 2015-07-21 ENCOUNTER — Ambulatory Visit (INDEPENDENT_AMBULATORY_CARE_PROVIDER_SITE_OTHER): Payer: Medicare Other | Admitting: General Practice

## 2015-07-21 DIAGNOSIS — Z5181 Encounter for therapeutic drug level monitoring: Secondary | ICD-10-CM

## 2015-07-21 LAB — POCT INR: INR: 2.2

## 2015-07-21 NOTE — Progress Notes (Signed)
I have reviewed and agree with the plan. 

## 2015-07-21 NOTE — Progress Notes (Signed)
Pre visit review using our clinic review tool, if applicable. No additional management support is needed unless otherwise documented below in the visit note. 

## 2015-08-18 ENCOUNTER — Ambulatory Visit (INDEPENDENT_AMBULATORY_CARE_PROVIDER_SITE_OTHER): Payer: Medicare Other | Admitting: General Practice

## 2015-08-18 DIAGNOSIS — Z5181 Encounter for therapeutic drug level monitoring: Secondary | ICD-10-CM

## 2015-08-18 LAB — POCT INR: INR: 2

## 2015-08-18 NOTE — Progress Notes (Signed)
I have reviewed and agree with the plan. 

## 2015-08-18 NOTE — Progress Notes (Signed)
Pre visit review using our clinic review tool, if applicable. No additional management support is needed unless otherwise documented below in the visit note. 

## 2015-08-25 ENCOUNTER — Telehealth: Payer: Self-pay | Admitting: Internal Medicine

## 2015-08-25 NOTE — Telephone Encounter (Signed)
Agree  that she needs to be seen, she is elderly lady and is completely reasonable to talk w/ her daughter if she is not doing well. Let's try to get the daughter in the DPR when she comes back.

## 2015-08-25 NOTE — Telephone Encounter (Signed)
FYI. Also, Pt's daughter is not on most recent DPR.

## 2015-08-25 NOTE — Telephone Encounter (Signed)
[facility] - Client [place] [facility]  Patient Name: Virginia Price  DOB: June 08, 1925    Initial Comment caller states mother is extremely weak, has fallen a couple times and isn't eating much   Nurse Assessment  Nurse: Wayne Sever, RN, Tillie Rung Date/Time (Eastern Time): 08/25/2015 11:28:54 AM  Confirm and document reason for call. If symptomatic, describe symptoms. You must click the next button to save text entered. ---She has been in bed about 80% of the time in the last couple of weeks. She has no strength and is weak per daughter. Daughter is not with her. She gave me number of [phone] to reach her mother. Was able to get patient on the phone. She states she is weak and fell this past Sunday. She denies being evaluated by a physician for this yet.  Has the patient traveled out of the country within the last 30 days? ---Not Applicable  Does the patient have any new or worsening symptoms? ---Yes  Will a triage be completed? ---Yes  Related visit to physician within the last 2 weeks? ---No  Does the PT have any chronic conditions? (i.e. diabetes, asthma, etc.) ---Yes  List chronic conditions. ---CHF, Arthritis,  Is this a behavioral health or substance abuse call? ---No     Guidelines    Guideline Title Affirmed Question Affirmed Notes  Weakness (Generalized) and Fatigue [1] MODERATE weakness (i.e., interferes with work, school, normal activities) AND [2] cause unknown (Exceptions: weakness with acute minor illness, or weakness from poor fluid intake)    Final Disposition User   See Physician within 4 Hours (or PCP triage) Wayne Sever, RN, Tillie Rung    Comments  No appointments left for today, they are going to [place]   Disagree/Comply: Comply

## 2015-08-25 NOTE — Telephone Encounter (Signed)
Agreed.  Pt should go to ER.

## 2015-08-25 NOTE — Telephone Encounter (Signed)
Called pt to follow up per request of Damita Dunnings, CMA as chart review shows pt has not gone to [facility] as planned. Called pt at [phone]. She states she is not going to the ER. States she was dressed and ready to go and her daughter arrived to pick her up her daughter said, "There's no point in you going over there to wait around all day. So that was the end of that." Pt stated she would like to come into the office to see Dr. Larose Kells. Informed pt that Dr. Larose Kells does not have any openings tomorrow, and offered appt with another provider. Pt stated she would have to call her daughter to find out when she could come, as the pt has another appt tomorrow and she depends on her daughter to drive her. The pt stated she would call the office to schedule an appt after speaking w/ her daughter. Of note, pt's daughter is not listed on her most recent DPR.

## 2015-08-31 ENCOUNTER — Ambulatory Visit: Payer: Medicare Other | Admitting: Internal Medicine

## 2015-09-13 ENCOUNTER — Ambulatory Visit: Payer: Medicare Other | Admitting: Internal Medicine

## 2015-09-14 ENCOUNTER — Telehealth: Payer: Self-pay | Admitting: Internal Medicine

## 2015-09-14 NOTE — Telephone Encounter (Signed)
error:[id number] ° °

## 2015-09-17 ENCOUNTER — Encounter: Payer: Self-pay | Admitting: Internal Medicine

## 2015-09-17 ENCOUNTER — Ambulatory Visit (INDEPENDENT_AMBULATORY_CARE_PROVIDER_SITE_OTHER): Payer: Medicare Other | Admitting: General Practice

## 2015-09-17 ENCOUNTER — Ambulatory Visit (INDEPENDENT_AMBULATORY_CARE_PROVIDER_SITE_OTHER): Payer: Medicare Other | Admitting: Internal Medicine

## 2015-09-17 VITALS — BP 132/76 | HR 82 | Temp 98.2°F | Ht 65.0 in | Wt 158.0 lb

## 2015-09-17 DIAGNOSIS — Z09 Encounter for follow-up examination after completed treatment for conditions other than malignant neoplasm: Secondary | ICD-10-CM

## 2015-09-17 DIAGNOSIS — E785 Hyperlipidemia, unspecified: Secondary | ICD-10-CM

## 2015-09-17 DIAGNOSIS — I10 Essential (primary) hypertension: Secondary | ICD-10-CM

## 2015-09-17 DIAGNOSIS — R269 Unspecified abnormalities of gait and mobility: Secondary | ICD-10-CM | POA: Diagnosis not present

## 2015-09-17 DIAGNOSIS — Z5181 Encounter for therapeutic drug level monitoring: Secondary | ICD-10-CM

## 2015-09-17 LAB — BASIC METABOLIC PANEL
BUN: 13 mg/dL (ref 6–23)
CHLORIDE: 106 meq/L (ref 96–112)
CO2: 27 meq/L (ref 19–32)
CREATININE: 0.83 mg/dL (ref 0.40–1.20)
Calcium: 9.7 mg/dL (ref 8.4–10.5)
GFR: 69.05 mL/min (ref >60.00)
GLUCOSE: 103 mg/dL — AB (ref 70–99)
POTASSIUM: 4.7 meq/L (ref 3.5–5.1)
Sodium: 143 mEq/L (ref 135–145)

## 2015-09-17 LAB — LIPID PANEL
CHOL/HDL RATIO: 4
Cholesterol: 222 mg/dL — ABNORMAL HIGH (ref 0–200)
HDL: 53.2 mg/dL (ref >39.00)
LDL CALC: 137 mg/dL — AB (ref 0–99)
NONHDL: 168.74
TRIGLYCERIDES: 160 mg/dL — AB (ref 0.0–149.0)
VLDL: 32 mg/dL (ref 0.0–40.0)

## 2015-09-17 LAB — POCT INR: INR: 1.3

## 2015-09-17 LAB — ALT: ALT: 12 U/L (ref 0–35)

## 2015-09-17 LAB — AST: AST: 14 U/L (ref 0–37)

## 2015-09-17 NOTE — Progress Notes (Signed)
Pre visit review using our clinic review tool, if applicable. No additional management support is needed unless otherwise documented below in the visit note. 

## 2015-09-17 NOTE — Progress Notes (Signed)
Subjective:    Patient ID: Virginia Price, female    DOB: 12/24/1925, 82 y.o.   MRN: [id number]  DOS:  09/17/2015 Type of visit - description : Routine follow-up, here with 2 family members Interval history: Saw cardiology 05-2015, was a slightly vol overload, was Rx additional Lasix for 2 days. High cholesterol: Vytorin was changed to simvastatin, good compliance without apparent side effects Gait disorder, had another fall few weeks ago.   Review of Systems  Continued to be sleepy most days, not a new issue No chest pain. Difficulty breathing at baseline. No orthopnea She has edema some days.  Past Medical History  Diagnosis Date  . Atrial fibrillation (HCC)     Coumadin therapy, dx via  Holter monitor, March, 2012, atrial fib heart rate ranges, no more bradycardia or tachycardia while on digoxin  . Chest pain     Catheterization normal 2004, /  nuclear 2006 no ischemia  . Hyperlipidemia   . Hypertension   . Stroke [facility])     (10-03 L MCA territory)  . Allergic rhinitis   . GERD (gastroesophageal reflux disease)   . Diverticular disease   . Colon polyp   . Abnormal chest CT     "innumerable small nodules  . Malignant melanoma ([place])   . Memory loss   . Restless leg syndrome   . CHF (congestive heart failure) ([place])     Diastolic with fluid overload, normal systolic function,, July, 2012.  Atrial fibrillation  . Mitral regurgitation     Mild to moderate, echo, January, 2012  . Erosive esophagitis   . Esophageal stricture   . Fibromyalgia     question of  . Diverticulosis     Past Surgical History  Procedure Laterality Date  . Cholecystectomy  1991  . Radical hysterectomy  1978    hyst and B oophorectomy  . Cataract extraction Bilateral   . Appendectomy    . Mohs surgery      03/2005    Social History   Social History  . Marital Status: Widowed    Spouse Name: N/A  . Number of Children: 3  . Years of Education: N/A   Occupational History  . retired       Social History Main Topics  . Smoking status: Never Smoker   . Smokeless tobacco: Never Used  . Alcohol Use: No  . Drug Use: No  . Sexual Activity: Not on file   Other Topics Concern  . Not on file   Social History Narrative   Lives by herself, 1 daughter in [place], 1 son in [place], 1 daughter in [place]     Her neighbor very close to her Vaughan Basta)    Sport and exercise psychologist ---Katharine Look (daughter)  [phone]   Has a companion few hours a day         Medication List       This list is accurate as of: 09/17/15 11:59 PM.  Always use your most recent med list.               albuterol 108 (90 Base) MCG/ACT inhaler  Commonly known as:  PROVENTIL HFA;VENTOLIN HFA  Inhale 2 puffs into the lungs every 6 (six) hours as needed for wheezing or shortness of breath.     calcium-vitamin D 250-125 MG-UNIT tablet  Commonly known as:  OSCAL  Take 1 tablet by mouth daily.     cephALEXin 250 MG capsule  Commonly known as:  KEFLEX  Take 250  mg by mouth daily. Daily for UTI prevention     dicyclomine 10 MG capsule  Commonly known as:  BENTYL  Take 1 capsule (10 mg total) by mouth 3 (three) times daily before meals.     escitalopram 10 MG tablet  Commonly known as:  LEXAPRO  Take 1 tablet (10 mg total) by mouth daily.     fluticasone 50 MCG/ACT nasal spray  Commonly known as:  FLONASE  Place 2 sprays into the nose as needed for allergies or rhinitis.     furosemide 20 MG tablet  Commonly known as:  LASIX  Take 1 tablet (20 mg total) by mouth daily.     meclizine 25 MG tablet  Commonly known as:  ANTIVERT  Take 1 tablet by mouth 2 (two) times daily.     metoprolol 50 MG tablet  Commonly known as:  LOPRESSOR  Take 0.5 tablets (25 mg total) by mouth 2 (two) times daily.     multivitamin per tablet  Take 1 tablet by mouth daily.     nitroGLYCERIN 0.4 MG/SPRAY spray  Commonly known as:  NITROLINGUAL  Place 1 spray under the tongue every 5 (five) minutes x 3 doses as needed. Reported on  09/17/2015     simvastatin 40 MG tablet  Commonly known as:  ZOCOR  Take 1 tablet (40 mg total) by mouth at bedtime.     VOLTAREN 1 % Gel  Generic drug:  diclofenac sodium  Apply 1 application topically 4 (four) times daily.     warfarin 2 MG tablet  Commonly known as:  COUMADIN  Take as directed by [facility]madin Clinic.           Objective:   Physical Exam BP 132/76 mmHg  Pulse 82  Temp(Src) 98.2 F (36.8 C) (Oral)  Ht 5\' 5"  (1.651 m)  Wt 158 lb (71.668 kg)  BMI 26.29 kg/m2  SpO2 97% General:   Well developed, well nourished,   on a wheelchair without apparent distress HEENT:  Normocephalic . Face symmetric, atraumatic Lungs:  CTA B Normal respiratory effort, no intercostal retractions, no accessory muscle use. Heart: Irregular  no murmur.  No pretibial edema bilaterally  Skin: Not pale. Not jaundice Neurologic:  alert & oriented X3.  Speech normal Psych--  Cognition and judgment appear intact.  Cooperative with normal attention span and concentration.  Behavior appropriate. No anxious or depressed appearing.      Assessment & Plan:   Assessment> HTN Hyperlipidemia Depression  Gait d/o, falls: 08-2015 counseled, declined PT Chronic dizziness  GI: ---GERD ---Diarrhea--> , IBS? Per GI 2016 ---colon polyp, diverticular disease ---erosive esophagitis, esophageal stricture DJD-- knees on voltaren gel CV: ---Atrial fibrillation DX 2012 with a Holter monitor ---CHF ---Mitral regurgitation moderate echocardiogram 2012 --- Stroke 2003 ---Chest pain: Normal cath 2004, negative nuclear test 2006 Lung nodules per CT, last CT ~ 02-2015 at [facility], stable H/o Melanoma Memory loss ? Fibromyalgia  PLAN HTN: Continue Lopressor, Lasix. Check a BMP Hyperlipidemia: Vytorin was switch to simvastatin, d/t cost, check a FLP, AST, ALT Gait disorder: Had another fall several weeks ago, ongoing dizziness ---> we discussed strategies to prevent falls such as  getting up slow and steady herself before walking. Also encouraged physical therapy, so far she continue declining. Benefits of PT discussed RTC 4-6 months

## 2015-09-17 NOTE — Progress Notes (Signed)
I have reviewed and agree with the plan. 

## 2015-09-17 NOTE — Patient Instructions (Signed)
GO TO THE LAB :      Get the blood work     GO TO THE FRONT DESK Schedule your next appointment for a  Check up in 4-6 months, no fasting

## 2015-09-18 DIAGNOSIS — R269 Unspecified abnormalities of gait and mobility: Secondary | ICD-10-CM | POA: Insufficient documentation

## 2015-09-18 NOTE — Assessment & Plan Note (Signed)
HTN: Continue Lopressor, Lasix. Check a BMP Hyperlipidemia: Vytorin was switch to simvastatin, d/t cost, check a FLP, AST, ALT Gait disorder: Had another fall several weeks ago, ongoing dizziness ---> we discussed strategies to prevent falls such as getting up slow and steady herself before walking. Also encouraged physical therapy, so far she continue declining. Benefits of PT discussed RTC 4-6 months

## 2015-09-21 ENCOUNTER — Telehealth: Payer: Self-pay | Admitting: *Deleted

## 2015-09-21 ENCOUNTER — Telehealth: Payer: Self-pay | Admitting: Internal Medicine

## 2015-09-21 MED ORDER — EZETIMIBE 10 MG PO TABS: 10.0000 mg | ORAL_TABLET | Freq: Every day | ORAL | Status: AC

## 2015-09-21 NOTE — Telephone Encounter (Signed)
error:[id number] ° °

## 2015-09-21 NOTE — Telephone Encounter (Signed)
-----   Message from Colon Branch, MD sent at 09/17/2015  5:03 PM EDT ----- 82 year old lady with history of the stroke, LDL 137, goal 70. Vytorin was discontinue in the past due to cost. -----> Advise patient: Needs better cholesterol control, recommend to start Zetia 10 mg one tablet daily in addition to the other medications, at this point, the cost of that medication may have decreased significantly. Send a prescription. Other labs satisfactory

## 2015-09-21 NOTE — Telephone Encounter (Signed)
Patient Unavailable; daughter [on HIPAA] informed, understood & agreed; new Rx to pharmacy/SLS 04/25

## 2015-09-28 ENCOUNTER — Telehealth: Payer: Self-pay | Admitting: Internal Medicine

## 2015-09-28 NOTE — Telephone Encounter (Signed)
Caller name: [place]  Can be reached: [phone]   Reason for call: Patient is requesting Lidocaine Ointment from the pharmacy and the pharmacist does not show that patient has this rx. Plse Adv

## 2015-09-29 NOTE — Telephone Encounter (Signed)
Called and spoke with the pt's daughter Katharine Look) and informed her of the note below.  She stated that the pt does not use the Lidocaine Ointment, and the pt does not use the [facility].   So she asked me to disregarding the call.  Tried to call [facility] regarding the call but unable to speak with anyone.//AB/CMA

## 2015-10-06 DIAGNOSIS — L820 Inflamed seborrheic keratosis: Secondary | ICD-10-CM | POA: Diagnosis not present

## 2015-10-15 ENCOUNTER — Ambulatory Visit (INDEPENDENT_AMBULATORY_CARE_PROVIDER_SITE_OTHER): Payer: Medicare Other | Admitting: General Practice

## 2015-10-15 DIAGNOSIS — Z5181 Encounter for therapeutic drug level monitoring: Secondary | ICD-10-CM

## 2015-10-15 LAB — POCT INR: INR: 1.8

## 2015-10-15 NOTE — Progress Notes (Signed)
I have reviewed and agree with the plan. 

## 2015-10-15 NOTE — Progress Notes (Signed)
Pre visit review using our clinic review tool, if applicable. No additional management support is needed unless otherwise documented below in the visit note. 

## 2015-11-12 ENCOUNTER — Ambulatory Visit (INDEPENDENT_AMBULATORY_CARE_PROVIDER_SITE_OTHER): Payer: Medicare Other | Admitting: General Practice

## 2015-11-12 DIAGNOSIS — Z5181 Encounter for therapeutic drug level monitoring: Secondary | ICD-10-CM

## 2015-11-12 LAB — POCT INR: INR: 1.7

## 2015-11-12 NOTE — Progress Notes (Signed)
Pre visit review using our clinic review tool, if applicable. No additional management support is needed unless otherwise documented below in the visit note. 

## 2015-11-12 NOTE — Progress Notes (Signed)
I have reviewed and agree with the plan. 

## 2015-11-13 ENCOUNTER — Other Ambulatory Visit: Payer: Self-pay | Admitting: Internal Medicine

## 2015-11-15 ENCOUNTER — Other Ambulatory Visit: Payer: Self-pay

## 2015-11-15 MED ORDER — WARFARIN SODIUM 2 MG PO TABS: ORAL_TABLET | ORAL | Status: DC

## 2015-11-22 DIAGNOSIS — L0100 Impetigo, unspecified: Secondary | ICD-10-CM | POA: Diagnosis not present

## 2015-12-10 ENCOUNTER — Ambulatory Visit (INDEPENDENT_AMBULATORY_CARE_PROVIDER_SITE_OTHER): Payer: Medicare Other | Admitting: General Practice

## 2015-12-10 DIAGNOSIS — Z5181 Encounter for therapeutic drug level monitoring: Secondary | ICD-10-CM

## 2015-12-10 LAB — POCT INR: INR: 2.3

## 2015-12-10 NOTE — Progress Notes (Signed)
I have reviewed and agree with the plan. 

## 2015-12-10 NOTE — Progress Notes (Signed)
Pre visit review using our clinic review tool, if applicable. No additional management support is needed unless otherwise documented below in the visit note. 

## 2016-01-07 ENCOUNTER — Ambulatory Visit (INDEPENDENT_AMBULATORY_CARE_PROVIDER_SITE_OTHER): Payer: Self-pay | Admitting: General Practice

## 2016-01-07 DIAGNOSIS — Z5181 Encounter for therapeutic drug level monitoring: Secondary | ICD-10-CM

## 2016-01-07 LAB — POCT INR: INR: 3.7

## 2016-01-07 NOTE — Progress Notes (Signed)
I have reviewed and agree with the plan. 

## 2016-02-04 ENCOUNTER — Ambulatory Visit: Payer: Medicare Other | Admitting: Medical

## 2016-02-04 ENCOUNTER — Ambulatory Visit (INDEPENDENT_AMBULATORY_CARE_PROVIDER_SITE_OTHER): Payer: Medicare Other | Admitting: General Practice

## 2016-02-04 DIAGNOSIS — Z5181 Encounter for therapeutic drug level monitoring: Secondary | ICD-10-CM

## 2016-02-04 LAB — POCT INR: INR: 1.6

## 2016-02-06 NOTE — Progress Notes (Signed)
I have reviewed and agree with the plan. 

## 2016-02-09 ENCOUNTER — Encounter: Payer: Self-pay | Admitting: Cardiology

## 2016-02-17 ENCOUNTER — Telehealth: Payer: Self-pay

## 2016-02-17 ENCOUNTER — Ambulatory Visit (INDEPENDENT_AMBULATORY_CARE_PROVIDER_SITE_OTHER): Payer: Medicare Other | Admitting: Internal Medicine

## 2016-02-17 ENCOUNTER — Encounter: Payer: Self-pay | Admitting: Internal Medicine

## 2016-02-17 VITALS — BP 126/82 | HR 86 | Temp 98.2°F | Resp 14 | Ht 65.0 in | Wt 165.2 lb

## 2016-02-17 DIAGNOSIS — I10 Essential (primary) hypertension: Secondary | ICD-10-CM | POA: Diagnosis not present

## 2016-02-17 DIAGNOSIS — R627 Adult failure to thrive: Secondary | ICD-10-CM | POA: Diagnosis not present

## 2016-02-17 DIAGNOSIS — F0390 Unspecified dementia without behavioral disturbance: Secondary | ICD-10-CM

## 2016-02-17 DIAGNOSIS — Z23 Encounter for immunization: Secondary | ICD-10-CM

## 2016-02-17 DIAGNOSIS — E785 Hyperlipidemia, unspecified: Secondary | ICD-10-CM

## 2016-02-17 LAB — LIPID PANEL
CHOL/HDL RATIO: 5
CHOLESTEROL: 232 mg/dL — AB (ref 0–200)
HDL: 48 mg/dL (ref >39.00)
NonHDL: 183.81
TRIGLYCERIDES: 201 mg/dL — AB (ref 0.0–149.0)
VLDL: 40.2 mg/dL — ABNORMAL HIGH (ref 0.0–40.0)

## 2016-02-17 LAB — BASIC METABOLIC PANEL
BUN: 11 mg/dL (ref 6–23)
CALCIUM: 9 mg/dL (ref 8.4–10.5)
CHLORIDE: 104 meq/L (ref 96–112)
CO2: 28 mEq/L (ref 19–32)
CREATININE: 0.8 mg/dL (ref 0.40–1.20)
GFR: 71.98 mL/min (ref >60.00)
Glucose, Bld: 89 mg/dL (ref 70–99)
Potassium: 4.1 mEq/L (ref 3.5–5.1)
Sodium: 139 mEq/L (ref 135–145)

## 2016-02-17 LAB — LDL CHOLESTEROL, DIRECT: Direct LDL: 159 mg/dL

## 2016-02-17 MED ORDER — MECLIZINE HCL 25 MG PO TABS: 25.0000 mg | ORAL_TABLET | Freq: Two times a day (BID) | ORAL | 2 refills | Status: AC | PRN

## 2016-02-17 MED ORDER — NYSTATIN-TRIAMCINOLONE 100000-0.1 UNIT/GM-% EX OINT: 1.0000 "application " | TOPICAL_OINTMENT | Freq: Two times a day (BID) | CUTANEOUS | 1 refills | Status: DC

## 2016-02-17 MED ORDER — NYSTATIN 100000 UNIT/GM EX OINT: 1.0000 "application " | TOPICAL_OINTMENT | Freq: Two times a day (BID) | CUTANEOUS | 0 refills | Status: AC

## 2016-02-17 MED ORDER — HYDROCORTISONE 2.5 % EX CREA: TOPICAL_CREAM | Freq: Two times a day (BID) | CUTANEOUS | 0 refills | Status: AC

## 2016-02-17 NOTE — Telephone Encounter (Signed)
Advise patient, I sent a prescription for hydrocortisone and nystatin separately, to mix them  50-50 and use twice a day.

## 2016-02-17 NOTE — Telephone Encounter (Signed)
[facility] called, Rx for Nystatin-Triamcinolone ointment is $124. Pt requesting cheaper medication. Informed [facility] provider has left office for today but would send message. Also informed that it maybe tomorrow before I hear back regarding medication. [facility] verbalized understanding and would inform Pt.

## 2016-02-17 NOTE — Patient Instructions (Signed)
GO TO THE LAB : Get the blood work     GO TO THE FRONT DESK Schedule your next appointment for a  Check up in 3 months  

## 2016-02-17 NOTE — Progress Notes (Signed)
Pre visit review using our clinic review tool, if applicable. No additional management support is needed unless otherwise documented below in the visit note. 

## 2016-02-17 NOTE — Progress Notes (Signed)
Subjective:    Patient ID: Virginia Price, female    DOB: 27-Oct-1925, 82 y.o.   MRN: [id number]  DOS:  02/17/2016 Type of visit - description : Routine visit, here with 2 of her daughters. they have multiple concerns that like to d/w me. Has a persistent rash for 3 months under the breasts.  She picks and chooses the medication she takes. Diet is not healthy, eats unhealthy  snacks and ice cream Still driving, family feels she is unsafe to do. Sometimes needs help getting in and out of her car. Chronic dizziness, needs a refill on meclizine More confused lately, hears people in her house. Would like to get a DO NOT RESUSCITATE paperwork   Review of Systems  Denies constipation, fever, chills. No headaches or recent head injury Chronic depression at baseline.  Past Medical History:  Diagnosis Date  . Abnormal chest CT    "innumerable small nodules  . Allergic rhinitis   . Atrial fibrillation (HCC)    Coumadin therapy, dx via  Holter monitor, March, 2012, atrial fib heart rate ranges, no more bradycardia or tachycardia while on digoxin  . Chest pain    Catheterization normal 2004, /  nuclear 2006 no ischemia  . CHF (congestive heart failure) ([place])    Diastolic with fluid overload, normal systolic function,, July, 2012.  Atrial fibrillation  . Colon polyp   . Diverticular disease   . Diverticulosis   . Erosive esophagitis   . Esophageal stricture   . Fibromyalgia    question of  . GERD (gastroesophageal reflux disease)   . Hyperlipidemia   . Hypertension   . Malignant melanoma ([place])   . Memory loss   . Mitral regurgitation    Mild to moderate, echo, January, 2012  . Restless leg syndrome   . Stroke [facility])    (10-03 L MCA territory)    Past Surgical History:  Procedure Laterality Date  . APPENDECTOMY    . CATARACT EXTRACTION Bilateral   . CHOLECYSTECTOMY  1991  . MOHS SURGERY     03/2005  . RADICAL HYSTERECTOMY  1978   hyst and B oophorectomy    Social  History   Social History  . Marital status: Widowed    Spouse name: N/A  . Number of children: 3  . Years of education: N/A   Occupational History  . retired  Retired   Social History Main Topics  . Smoking status: Never Smoker  . Smokeless tobacco: Never Used  . Alcohol use No  . Drug use: No  . Sexual activity: Not on file   Other Topics Concern  . Not on file   Social History Narrative   Lives by herself, 1 daughter in [place], 1 son in [place], 1 daughter in [place]     Her neighbor very close to her Vaughan Basta)    Sport and exercise psychologist ---Katharine Look (daughter)  [phone]   Has a companion few hours a day         Medication List       Accurate as of 02/17/16 11:32 AM. Always use your most recent med list.          albuterol 108 (90 Base) MCG/ACT inhaler Commonly known as:  PROVENTIL HFA;VENTOLIN HFA Inhale 2 puffs into the lungs every 6 (six) hours as needed for wheezing or shortness of breath.   calcium-vitamin D 250-125 MG-UNIT tablet Commonly known as:  OSCAL Take 1 tablet by mouth daily.   cephALEXin 250 MG capsule Commonly known  as:  KEFLEX Take 250 mg by mouth daily. Daily for UTI prevention   dicyclomine 10 MG capsule Commonly known as:  BENTYL Take 1 capsule (10 mg total) by mouth 3 (three) times daily before meals.   escitalopram 10 MG tablet Commonly known as:  LEXAPRO Take 1 tablet (10 mg total) by mouth daily.   ezetimibe 10 MG tablet Commonly known as:  ZETIA Take 1 tablet (10 mg total) by mouth daily.   fluticasone 50 MCG/ACT nasal spray Commonly known as:  FLONASE Place 2 sprays into the nose as needed for allergies or rhinitis.   furosemide 20 MG tablet Commonly known as:  LASIX Take 1 tablet (20 mg total) by mouth daily.   meclizine 25 MG tablet Commonly known as:  ANTIVERT Take 1 tablet by mouth 2 (two) times daily.   metoprolol 50 MG tablet Commonly known as:  LOPRESSOR Take 0.5 tablets (25 mg total) by mouth 2 (two) times daily.     multivitamin per tablet Take 1 tablet by mouth daily.   nitroGLYCERIN 0.4 MG/SPRAY spray Commonly known as:  NITROLINGUAL Place 1 spray under the tongue every 5 (five) minutes x 3 doses as needed. Reported on 09/17/2015   simvastatin 40 MG tablet Commonly known as:  ZOCOR Take 1 tablet (40 mg total) by mouth at bedtime.   VOLTAREN 1 % Gel Generic drug:  diclofenac sodium Apply 1 application topically 4 (four) times daily.   warfarin 2 MG tablet Commonly known as:  COUMADIN Take as directed by Coumadin Clinic.          Objective:   Physical Exam BP 126/82 (BP Location: Left Arm, Patient Position: Sitting, Cuff Size: Small)   Pulse 86   Temp 98.2 F (36.8 C) (Oral)   Resp 14   Ht 5\' 5"  (1.651 m)   Wt 165 lb 4 oz (75 kg)   SpO2 96%   BMI 27.50 kg/m   General:   Well developed, well nourished . NAD.  HEENT:  Normocephalic . Face symmetric, atraumatic Lungs:  CTA B Normal respiratory effort, no intercostal retractions, no accessory muscle use. Heart: Regular,  no murmur.  No pretibial edema bilaterally  Skin: Erythematous rash without blisters, some maceration under her breasts bilaterally Neurologic:  alert and cooperative, pleasant .  Speech normal, gait not tested Strength symmetric and appropriate for age.  Psych: MMSE 20 (lost 5 points by not spelling WORLD backwards) No anxious or depressed appearing.    Assessment & Plan:  Assessment> HTN Hyperlipidemia Depression  Gait d/o, falls: 08-2015 counseled, declined PT Chronic dizziness  GI: ---GERD ---Diarrhea--> , IBS? Per GI 2016 ---colon polyp, diverticular disease ---erosive esophagitis, esophageal stricture DJD-- knees on voltaren gel CV: ---Atrial fibrillation DX 2012 with a Holter monitor ---CHF ---Mitral regurgitation moderate echocardiogram 2012 --- Stroke 2003 ---Chest pain: Normal cath 2004, negative nuclear test 2006 Lung nodules per CT, last CT ~ [facility],  stable H/o Melanoma Memory loss ---> dementia, MMSE: scored 27  (June 2016), 20 (01-2016) ? Fibromyalgia  PLAN Multiple issues addressed. As far as diet, recommend moderation but not necessarily a strict diet given her age. As far as picking and choosing her medication recommend to take BP meds, Coumadin and Lexapro every day to avoid problems. If she likes to simply stop cholesterol medication I'm not opposed again because her age. The family is in agreement. Strongly recommend not to drive DO NOT RESUSCITATE signed Rash under the breasts: Rx Mycolog HTN: Controlled. Continue  Lasix, Lopressor, check a BMP Hyperlipidemia: Based on last cholesterol panel, Zetia was added to simvastatin, cost was an issue. Apparently not taking her medication consistently. Check labs. See comments above, okay to DC medications if she wishes . Dementia:  MMSE score 20 (moderate dementia), was 23 November 2014, increased confusion >> discuss option of taking medication;  Patient lives by herself, is requiring more help and at some point will need placement . Will send a home health referral, she likely will qualify for help. Coumadin is managed elsewhere Failure to thrive: d/t dementia, decreased mobility, rec not  to drive, needs help with showers-cooking. Again will send a home health referral RTC  3 months    Today, I spent more than 45   min with the patient: >50% of the time counseling regards diet, medication compliance, stop driving, the rationale behind every advise was discussed with the patient and her family. They have multiple questions that were answered to the best of my ability. Also: coordinating her care

## 2016-02-17 NOTE — Assessment & Plan Note (Addendum)
Multiple issues addressed. As far as diet, recommend moderation but not necessarily a strict diet given her age. As far as picking and choosing her medication recommend to take BP meds, Coumadin and Lexapro every day to avoid problems. If she likes to simply stop cholesterol medication I'm not opposed again because her age. The family is in agreement. Strongly recommend not to drive DO NOT RESUSCITATE signed Rash under the breasts: Rx Mycolog HTN: Controlled. Continue Lasix, Lopressor, check a BMP Hyperlipidemia: Based on last cholesterol panel, Zetia was added to simvastatin, cost was an issue. Apparently not taking her medication consistently. Check labs. See comments above, okay to DC medications if she wishes . Dementia:  MMSE score 20 (moderate dementia), was 23 November 2014, increased confusion >> discuss option of taking medication;  Patient lives by herself, is requiring more help and at some point will need placement . Will send a home health referral, she likely will qualify for help. Coumadin is managed elsewhere Failure to thrive: d/t dementia, decreased mobility, rec not  to drive, needs help with showers-cooking. Again will send a home health referral RTC  3 months

## 2016-02-23 ENCOUNTER — Ambulatory Visit: Payer: Medicare Other | Admitting: Cardiology

## 2016-02-26 ENCOUNTER — Encounter (HOSPITAL_COMMUNITY): Payer: Self-pay | Admitting: Emergency Medicine

## 2016-02-26 ENCOUNTER — Observation Stay (HOSPITAL_COMMUNITY): Payer: Medicare Other

## 2016-02-26 ENCOUNTER — Emergency Department (HOSPITAL_COMMUNITY): Payer: Medicare Other

## 2016-02-26 ENCOUNTER — Inpatient Hospital Stay (HOSPITAL_COMMUNITY)
Admission: EM | Admit: 2016-02-26 | Discharge: 2016-03-01 | DRG: 371 | Disposition: A | Discharge status: Skilled Nursing Facility | Payer: Medicare Other | Attending: Internal Medicine | Admitting: Internal Medicine

## 2016-02-26 DIAGNOSIS — I10 Essential (primary) hypertension: Secondary | ICD-10-CM | POA: Diagnosis present

## 2016-02-26 DIAGNOSIS — Z85828 Personal history of other malignant neoplasm of skin: Secondary | ICD-10-CM

## 2016-02-26 DIAGNOSIS — Z66 Do not resuscitate: Secondary | ICD-10-CM | POA: Diagnosis present

## 2016-02-26 DIAGNOSIS — E785 Hyperlipidemia, unspecified: Secondary | ICD-10-CM | POA: Diagnosis present

## 2016-02-26 DIAGNOSIS — R410 Disorientation, unspecified: Secondary | ICD-10-CM | POA: Diagnosis not present

## 2016-02-26 DIAGNOSIS — I110 Hypertensive heart disease with heart failure: Secondary | ICD-10-CM | POA: Diagnosis present

## 2016-02-26 DIAGNOSIS — N390 Urinary tract infection, site not specified: Secondary | ICD-10-CM | POA: Diagnosis present

## 2016-02-26 DIAGNOSIS — Z86010 Personal history of colonic polyps: Secondary | ICD-10-CM | POA: Diagnosis not present

## 2016-02-26 DIAGNOSIS — R531 Weakness: Secondary | ICD-10-CM | POA: Diagnosis not present

## 2016-02-26 DIAGNOSIS — R440 Auditory hallucinations: Secondary | ICD-10-CM | POA: Diagnosis present

## 2016-02-26 DIAGNOSIS — G9341 Metabolic encephalopathy: Secondary | ICD-10-CM | POA: Diagnosis present

## 2016-02-26 DIAGNOSIS — R197 Diarrhea, unspecified: Secondary | ICD-10-CM | POA: Diagnosis not present

## 2016-02-26 DIAGNOSIS — B9620 Unspecified Escherichia coli [E. coli] as the cause of diseases classified elsewhere: Secondary | ICD-10-CM | POA: Diagnosis not present

## 2016-02-26 DIAGNOSIS — N12 Tubulo-interstitial nephritis, not specified as acute or chronic: Secondary | ICD-10-CM | POA: Diagnosis present

## 2016-02-26 DIAGNOSIS — Z8249 Family history of ischemic heart disease and other diseases of the circulatory system: Secondary | ICD-10-CM | POA: Diagnosis not present

## 2016-02-26 DIAGNOSIS — Z79899 Other long term (current) drug therapy: Secondary | ICD-10-CM

## 2016-02-26 DIAGNOSIS — A045 Campylobacter enteritis: Principal | ICD-10-CM

## 2016-02-26 DIAGNOSIS — R000 Tachycardia, unspecified: Secondary | ICD-10-CM | POA: Diagnosis not present

## 2016-02-26 DIAGNOSIS — I481 Persistent atrial fibrillation: Secondary | ICD-10-CM | POA: Diagnosis not present

## 2016-02-26 DIAGNOSIS — E876 Hypokalemia: Secondary | ICD-10-CM | POA: Diagnosis not present

## 2016-02-26 DIAGNOSIS — I4819 Other persistent atrial fibrillation: Secondary | ICD-10-CM | POA: Diagnosis present

## 2016-02-26 DIAGNOSIS — F0390 Unspecified dementia without behavioral disturbance: Secondary | ICD-10-CM | POA: Diagnosis present

## 2016-02-26 DIAGNOSIS — Z7901 Long term (current) use of anticoagulants: Secondary | ICD-10-CM

## 2016-02-26 DIAGNOSIS — M6281 Muscle weakness (generalized): Secondary | ICD-10-CM

## 2016-02-26 DIAGNOSIS — Z882 Allergy status to sulfonamides status: Secondary | ICD-10-CM | POA: Diagnosis not present

## 2016-02-26 DIAGNOSIS — Z880 Allergy status to penicillin: Secondary | ICD-10-CM | POA: Diagnosis not present

## 2016-02-26 DIAGNOSIS — Z8673 Personal history of transient ischemic attack (TIA), and cerebral infarction without residual deficits: Secondary | ICD-10-CM

## 2016-02-26 DIAGNOSIS — R112 Nausea with vomiting, unspecified: Secondary | ICD-10-CM | POA: Diagnosis present

## 2016-02-26 DIAGNOSIS — Z87440 Personal history of urinary (tract) infections: Secondary | ICD-10-CM

## 2016-02-26 DIAGNOSIS — K219 Gastro-esophageal reflux disease without esophagitis: Secondary | ICD-10-CM | POA: Diagnosis present

## 2016-02-26 DIAGNOSIS — Z8371 Family history of colonic polyps: Secondary | ICD-10-CM

## 2016-02-26 DIAGNOSIS — I5032 Chronic diastolic (congestive) heart failure: Secondary | ICD-10-CM | POA: Diagnosis not present

## 2016-02-26 DIAGNOSIS — K210 Gastro-esophageal reflux disease with esophagitis: Secondary | ICD-10-CM | POA: Diagnosis not present

## 2016-02-26 DIAGNOSIS — R404 Transient alteration of awareness: Secondary | ICD-10-CM | POA: Diagnosis not present

## 2016-02-26 LAB — URINE MICROSCOPIC-ADD ON

## 2016-02-26 LAB — URINALYSIS, ROUTINE W REFLEX MICROSCOPIC
Bilirubin Urine: NEGATIVE
GLUCOSE, UA: NEGATIVE mg/dL
KETONES UR: NEGATIVE mg/dL
NITRITE: POSITIVE — AB
PROTEIN: NEGATIVE mg/dL
Specific Gravity, Urine: 1.021 (ref 1.005–1.030)
pH: 5 (ref 5.0–8.0)

## 2016-02-26 LAB — PROTIME-INR
INR: 1.52
INR: 1.33
PROTHROMBIN TIME: 18.5 s — AB (ref 11.4–15.2)
Prothrombin Time: 16.6 seconds — ABNORMAL HIGH (ref 11.4–15.2)

## 2016-02-26 LAB — COMPREHENSIVE METABOLIC PANEL
ALK PHOS: 71 U/L (ref 38–126)
ALT: 11 U/L — AB (ref 14–54)
AST: 17 U/L (ref 15–41)
Albumin: 3.9 g/dL (ref 3.5–5.0)
Anion gap: 7 (ref 5–15)
BILIRUBIN TOTAL: 1.9 mg/dL — AB (ref 0.3–1.2)
BUN: 13 mg/dL (ref 6–20)
CALCIUM: 8.7 mg/dL — AB (ref 8.9–10.3)
CO2: 24 mmol/L (ref 22–32)
CREATININE: 0.84 mg/dL (ref 0.44–1.00)
Chloride: 105 mmol/L (ref 101–111)
Glucose, Bld: 125 mg/dL — ABNORMAL HIGH (ref 65–99)
Potassium: 3.6 mmol/L (ref 3.5–5.1)
Sodium: 136 mmol/L (ref 135–145)
TOTAL PROTEIN: 6.9 g/dL (ref 6.5–8.1)

## 2016-02-26 LAB — I-STAT CG4 LACTIC ACID, ED: LACTIC ACID, VENOUS: 1.57 mmol/L (ref 0.5–1.9)

## 2016-02-26 LAB — LACTIC ACID, PLASMA: LACTIC ACID, VENOUS: 1.6 mmol/L (ref 0.5–1.9)

## 2016-02-26 LAB — CBC
HCT: 38.9 % (ref 36.0–46.0)
Hemoglobin: 13.1 g/dL (ref 12.0–15.0)
MCH: 28.9 pg (ref 26.0–34.0)
MCHC: 33.7 g/dL (ref 30.0–36.0)
MCV: 85.7 fL (ref 78.0–100.0)
PLATELETS: 211 10*3/uL (ref 150–400)
RBC: 4.54 MIL/uL (ref 3.87–5.11)
RDW: 15 % (ref 11.5–15.5)
WBC: 10.6 10*3/uL — AB (ref 4.0–10.5)

## 2016-02-26 LAB — BRAIN NATRIURETIC PEPTIDE: B NATRIURETIC PEPTIDE 5: 257.9 pg/mL — AB (ref 0.0–100.0)

## 2016-02-26 LAB — LIPASE, BLOOD: Lipase: 18 U/L (ref 11–51)

## 2016-02-26 MED ORDER — IPRATROPIUM-ALBUTEROL 0.5-2.5 (3) MG/3ML IN SOLN
3.0000 mL | RESPIRATORY_TRACT | Status: DC | PRN
  Filled 2016-02-26: qty 3

## 2016-02-26 MED ORDER — SODIUM CHLORIDE 0.9% FLUSH
3.0000 mL | Freq: Two times a day (BID) | INTRAVENOUS | Status: DC
  Administered 2016-02-27 – 2016-02-28 (×3): 3 mL via INTRAVENOUS

## 2016-02-26 MED ORDER — ACETAMINOPHEN 325 MG PO TABS
650.0000 mg | ORAL_TABLET | Freq: Four times a day (QID) | ORAL | Status: DC | PRN
  Administered 2016-02-28: 650 mg via ORAL
  Filled 2016-02-26: qty 2

## 2016-02-26 MED ORDER — ESCITALOPRAM OXALATE 10 MG PO TABS
10.0000 mg | ORAL_TABLET | Freq: Every day | ORAL | Status: DC
  Administered 2016-02-26 – 2016-03-01 (×5): 10 mg via ORAL
  Filled 2016-02-26 (×5): qty 1

## 2016-02-26 MED ORDER — DEXTROSE 5 % IV SOLN
1.0000 g | Freq: Once | INTRAVENOUS | Status: AC
  Administered 2016-02-26: 1 g via INTRAVENOUS
  Filled 2016-02-26: qty 10

## 2016-02-26 MED ORDER — ONDANSETRON HCL 4 MG/2ML IJ SOLN: 4.0000 mg | Freq: Four times a day (QID) | INTRAMUSCULAR | Status: DC | PRN

## 2016-02-26 MED ORDER — SODIUM CHLORIDE 0.9 % IV BOLUS (SEPSIS)
1000.0000 mL | Freq: Once | INTRAVENOUS | Status: DC
  Administered 2016-02-26: 1000 mL via INTRAVENOUS

## 2016-02-26 MED ORDER — ACETAMINOPHEN 650 MG RE SUPP: 650.0000 mg | Freq: Four times a day (QID) | RECTAL | Status: DC | PRN

## 2016-02-26 MED ORDER — FAMOTIDINE IN NACL 20-0.9 MG/50ML-% IV SOLN
20.0000 mg | Freq: Two times a day (BID) | INTRAVENOUS | Status: DC
  Administered 2016-02-26 – 2016-02-29 (×6): 20 mg via INTRAVENOUS
  Filled 2016-02-26 (×6): qty 50

## 2016-02-26 MED ORDER — SIMVASTATIN 40 MG PO TABS
40.0000 mg | ORAL_TABLET | Freq: Every day | ORAL | Status: DC
  Administered 2016-02-26 – 2016-02-29 (×4): 40 mg via ORAL
  Filled 2016-02-26 (×4): qty 1

## 2016-02-26 MED ORDER — CEFTRIAXONE SODIUM 1 G IJ SOLR
1.0000 g | INTRAMUSCULAR | Status: DC
  Administered 2016-02-27 – 2016-02-28 (×2): 1 g via INTRAVENOUS
  Filled 2016-02-26 (×2): qty 10

## 2016-02-26 MED ORDER — HYDROCODONE-ACETAMINOPHEN 5-325 MG PO TABS
1.0000 | ORAL_TABLET | ORAL | Status: DC | PRN
  Administered 2016-02-28 – 2016-02-29 (×2): 1 via ORAL
  Filled 2016-02-26 (×2): qty 1

## 2016-02-26 MED ORDER — DICLOFENAC SODIUM 1 % TD GEL
1.0000 "application " | Freq: Four times a day (QID) | TRANSDERMAL | Status: DC
  Administered 2016-02-27 – 2016-03-01 (×13): 1 via TOPICAL
  Filled 2016-02-26: qty 100

## 2016-02-26 MED ORDER — SODIUM CHLORIDE 0.9 % IV SOLN
INTRAVENOUS | Status: DC
  Administered 2016-02-26: 90 mL/h via INTRAVENOUS

## 2016-02-26 MED ORDER — DILTIAZEM HCL 25 MG/5ML IV SOLN
10.0000 mg | Freq: Once | INTRAVENOUS | Status: AC
  Administered 2016-02-26: 10 mg via INTRAVENOUS
  Filled 2016-02-26: qty 5

## 2016-02-26 MED ORDER — ONDANSETRON HCL 4 MG PO TABS: 4.0000 mg | ORAL_TABLET | Freq: Four times a day (QID) | ORAL | Status: DC | PRN

## 2016-02-26 MED ORDER — WARFARIN SODIUM 4 MG PO TABS
4.0000 mg | ORAL_TABLET | Freq: Once | ORAL | Status: AC
  Administered 2016-02-26: 4 mg via ORAL
  Filled 2016-02-26: qty 1

## 2016-02-26 MED ORDER — MECLIZINE HCL 25 MG PO TABS
25.0000 mg | ORAL_TABLET | Freq: Two times a day (BID) | ORAL | Status: DC | PRN
  Filled 2016-02-26: qty 1

## 2016-02-26 MED ORDER — METOPROLOL TARTRATE 25 MG PO TABS
25.0000 mg | ORAL_TABLET | Freq: Two times a day (BID) | ORAL | Status: DC
  Administered 2016-02-26 – 2016-03-01 (×8): 25 mg via ORAL
  Filled 2016-02-26 (×8): qty 1

## 2016-02-26 MED ORDER — WARFARIN - PHARMACIST DOSING INPATIENT
Freq: Every day | Status: DC
Start: 2016-02-27 — End: 2016-03-01

## 2016-02-26 MED ORDER — ONDANSETRON HCL 4 MG/2ML IJ SOLN
4.0000 mg | Freq: Once | INTRAMUSCULAR | Status: AC
  Administered 2016-02-26: 4 mg via INTRAVENOUS
  Filled 2016-02-26: qty 2

## 2016-02-26 MED ORDER — ALBUTEROL SULFATE HFA 108 (90 BASE) MCG/ACT IN AERS: 2.0000 | INHALATION_SPRAY | RESPIRATORY_TRACT | Status: DC | PRN

## 2016-02-26 NOTE — Progress Notes (Signed)
Pharmacy Antibiotic Note  Virginia Price is a 82 y.o. female admitted on 02/26/2016 with UTI.  Pharmacy has been consulted for ceftriaxone dosing.  Plan: Ceftriaxone 1 gr IV q24h   Dosage remains stable at above dose and need for further dosage adjustment appears unlikely at present.    Will sign off at this time.  Please reconsult if a change in clinical status warrants re-evaluation of dosage.      Height: 5\' 5"  (165.1 cm) Weight: 165 lb (74.8 kg) IBW/kg (Calculated) : 57  Temp (24hrs), Avg:98.9 F (37.2 C), Min:98.1 F (36.7 C), Max:99.9 F (37.7 C)   Recent Labs Lab 02/26/16 1538 02/26/16 1948  WBC 10.6*  --   CREATININE 0.84  --   LATICACIDVEN  --  1.57    Estimated Creatinine Clearance: 47.7 mL/min (by C-G formula based on SCr of 0.84 mg/dL).    Allergies  Allergen Reactions  . Ciprofloxacin Hives  . Penicillins Rash  . Sulfamethoxazole-Trimethoprim Rash  . Sulfonamide Derivatives Rash  . [name] [Ethacrynic Acid] Other (See Comments)    UNKNOWN REACTION  . Hydrocodone-Acetaminophen Nausea Only  . Metronidazole Other (See Comments)    UNKNOWN REACTION  . Prednisone Other (See Comments)    UNKNOWN REACTION  . Tramadol Hcl Other (See Comments)    Sleepy    Antimicrobials this admission: 9/30 ceftriaxone >>    Dose adjustments this admission: ---  Microbiology results: 9/30 BCx:  9/30 C. diff:   9/30 GI panel 9/30 urine:   Thank you for allowing pharmacy to be a part of this patient's care.  Royetta Asal, PharmD, BCPS Pager [phone] 02/26/2016 9:05 PM

## 2016-02-26 NOTE — ED Notes (Signed)
Pharmacy notified pepcid medication is not crossing over. Pharmacy is fixing the problem.

## 2016-02-26 NOTE — ED Notes (Signed)
Patient pooped. Patient cleaned up and new gown and blankets placed.

## 2016-02-26 NOTE — ED Notes (Signed)
Report given to nurse on the floor. 

## 2016-02-26 NOTE — ED Notes (Signed)
EDP at bedside  

## 2016-02-26 NOTE — ED Triage Notes (Signed)
Per EMS pt neighbor and family called related to pt not out of bed today with n/v/d.

## 2016-02-26 NOTE — ED Notes (Signed)
BOTH blood cultures drawn at present time.

## 2016-02-26 NOTE — Progress Notes (Addendum)
ANTICOAGULATION CONSULT NOTE - Initial Consult  Pharmacy Consult for warfarin Indication: atrial fibrillation  Allergies  Allergen Reactions  . Ciprofloxacin Hives  . Penicillins Rash  . Sulfamethoxazole-Trimethoprim Rash  . Sulfonamide Derivatives Rash  . [name] [Ethacrynic Acid] Other (See Comments)    UNKNOWN REACTION  . Hydrocodone-Acetaminophen Nausea Only  . Metronidazole Other (See Comments)    UNKNOWN REACTION  . Prednisone Other (See Comments)    UNKNOWN REACTION  . Tramadol Hcl Other (See Comments)    Sleepy    Patient Measurements: Height: 5\' 5"  (165.1 cm) Weight: 165 lb (74.8 kg) IBW/kg (Calculated) : 57 Heparin Dosing Weight:   Vital Signs: Temp: 99.9 F (37.7 C) (09/30 1826) Temp Source: Rectal (09/30 1826) BP: 120/86 (09/30 1728) Pulse Rate: 95 (09/30 1728)  Labs:  Recent Labs  02/26/16 1538 02/26/16 1635 02/26/16 1959  HGB 13.1  --   --   HCT 38.9  --   --   PLT 211  --   --   LABPROT  --  18.5* 16.6*  INR  --  1.52 1.33  CREATININE 0.84  --   --     Estimated Creatinine Clearance: 47.7 mL/min (by C-G formula based on SCr of 0.84 mg/dL).   Medical History: Past Medical History:  Diagnosis Date  . Abnormal chest CT    "innumerable small nodules  . Allergic rhinitis   . Atrial fibrillation (HCC)    Coumadin therapy, dx via  Holter monitor, March, 2012, atrial fib heart rate ranges, no more bradycardia or tachycardia while on digoxin  . Chest pain    Catheterization normal 2004, /  nuclear 2006 no ischemia  . CHF (congestive heart failure) ([place])    Diastolic with fluid overload, normal systolic function,, July, 2012.  Atrial fibrillation  . Colon polyp   . Diverticular disease   . Diverticulosis   . Erosive esophagitis   . Esophageal stricture   . Fibromyalgia    question of  . GERD (gastroesophageal reflux disease)   . Hyperlipidemia   . Hypertension   . Malignant melanoma ([place])   . Memory loss   . Mitral regurgitation    Mild to moderate, echo, January, 2012  . Restless leg syndrome   . Stroke ([place])    (10-03 L MCA territory)    Medications:  Scheduled:  . diclofenac sodium  1 application Topical QID  . escitalopram  10 mg Oral Daily  . metoprolol  25 mg Oral BID  . simvastatin  40 mg Oral QHS  . sodium chloride flush  3 mL Intravenous Q12H    Assessment: Pharmacy is consulted to dose warfarin in 82 yo with PMH of afib. Target INR is 2-3.Pt is being admitted to hospital after experiencing N/V/D and UTI.  Pt home regimen is 2 mg PO daily except 4 mg PO on Fridays.  Today,02/26/16  Hgb, plt WNL  INR drawn twice, first time 1.52, second time 1.33,    Goal of Therapy:  INR 2-3 Monitor platelets by anticoagulation protocol: Yes   Plan:   Warfarin 4 mg PO x 1   Daily INR  CBC ordered with AM labs   Monitor for signs and symptoms of bleeding  Royetta Asal, PharmD, BCPS Pager [phone] 02/26/2016 8:57 PM

## 2016-02-26 NOTE — ED Provider Notes (Signed)
Virginia Price   CSN: [id number] Arrival date & time: 02/26/16  1528     History   Chief Complaint Chief Complaint  Patient presents with  . Emesis  . Diarrhea    HPI Virginia Price is a 82 y.o. female.  Patient brought to the emergency department by the paramedics after developing nausea vomiting and diarrhea today.  Was feeling too weak to get out of the bed.  No blood in vomit or diarrhea.  No fevers or chills per patient denies abdominal pain at this time.  Patient reports difficulty keeping fluids down secondary to nausea and vomiting.   The history is provided by the patient.    Past Medical History:  Diagnosis Date  . Abnormal chest CT    "innumerable small nodules  . Allergic rhinitis   . Atrial fibrillation (HCC)    Coumadin therapy, dx via  Holter monitor, March, 2012, atrial fib heart rate ranges, no more bradycardia or tachycardia while on digoxin  . Chest pain    Catheterization normal 2004, /  nuclear 2006 no ischemia  . CHF (congestive heart failure) ([place])    Diastolic with fluid overload, normal systolic function,, July, 2012.  Atrial fibrillation  . Colon polyp   . Diverticular disease   . Diverticulosis   . Erosive esophagitis   . Esophageal stricture   . Fibromyalgia    question of  . GERD (gastroesophageal reflux disease)   . Hyperlipidemia   . Hypertension   . Malignant melanoma ([place])   . Memory loss   . Mitral regurgitation    Mild to moderate, echo, January, 2012  . Restless leg syndrome   . Stroke [facility])    (10-03 L MCA territory)    Patient Active Problem List   Diagnosis Date Noted  . Gait disorder 09/18/2015  . Chronic anticoagulation 06/03/2015  . Persistent atrial fibrillation ([place]) 06/03/2015  . Orthostatic hypotension 06/03/2015  . Acute on chronic diastolic CHF (congestive heart failure), NYHA class 3 ([place]) 06/03/2015  . PCP NOTES >>>>> 04/20/2015  . PCP comments --Depression 10/15/2014  . DJD  (degenerative joint disease) 06/14/2014  . Lung nodule 10/01/2013  . Aortic insufficiency 09/05/2013  . Aortic valve sclerosis 09/05/2013  . Shortness of breath 08/20/2013  . Encounter for therapeutic drug monitoring 08/06/2013  . Warfarin anticoagulation 02/04/2012  . Dyspepsia 11/03/2011  . Annual physical exam 09/10/2011  . Ejection fraction <   . Recurrent UTI 02/28/2011  . Mitral regurgitation   . Chronic atrial fibrillation ([place])   . CHF (congestive heart failure) ([place]) 12/12/2010  . Chest pain   . GERD (gastroesophageal reflux disease)   . Abnormal chest CT   . PCP comments--DIZZINESS 06/02/2010  . IBS 05/06/2009  . BACK PAIN 08/10/2008  . MALIGNANT MELANOMA, SKIN 11/15/2007  . CARCINOMA, BASAL CELL, HX OF 11/15/2007  . PERSONAL HX COLONIC POLYPS 11/15/2007  . OVERACTIVE BLADDER 06/13/2007  . Dementia 06/13/2007  . ALLERGIC RHINITIS 03/16/2007  . GERD 03/16/2007  . RESTLESS LEG SYNDROME 01/30/2007  . Hyperlipidemia 06/02/2006  . Essential hypertension 06/02/2006    Past Surgical History:  Procedure Laterality Date  . APPENDECTOMY    . CATARACT EXTRACTION Bilateral   . CHOLECYSTECTOMY  1991  . MOHS SURGERY     03/2005  . RADICAL HYSTERECTOMY  1978   hyst and B oophorectomy    OB History    No data available       Home Medications    Prior to  Admission medications   Medication Sig Start Date End Date Taking? Authorizing Provider  albuterol (PROVENTIL HFA;VENTOLIN HFA) 108 (90 BASE) MCG/ACT inhaler Inhale 2 puffs into the lungs every 6 (six) hours as needed for wheezing or shortness of breath. 06/29/14   Colon Branch, MD  calcium-vitamin D (OSCAL) 250-125 MG-UNIT per tablet Take 1 tablet by mouth daily.      Historical Provider, MD  cephALEXin (KEFLEX) 250 MG capsule Take 250 mg by mouth daily. Daily for UTI prevention    Historical Provider, MD  dicyclomine (BENTYL) 10 MG capsule Take 1 capsule (10 mg total) by mouth 3 (three) times daily before meals. 02/24/15    Ladene Artist, MD  escitalopram (LEXAPRO) 10 MG tablet Take 1 tablet (10 mg total) by mouth daily. 04/27/15   Colon Branch, MD  ezetimibe (ZETIA) 10 MG tablet Take 1 tablet (10 mg total) by mouth daily. 09/21/15   Colon Branch, MD  fluticasone [facility]) 50 MCG/ACT nasal spray Place 2 sprays into the nose as needed for allergies or rhinitis.  09/25/12   Colon Branch, MD  furosemide (LASIX) 20 MG tablet Take 1 tablet (20 mg total) by mouth daily. 06/03/15   Dorothy Spark, MD  hydrocortisone 2.5 % cream Apply topically 2 (two) times daily. 02/17/16 02/16/17  Colon Branch, MD  meclizine (ANTIVERT) 25 MG tablet Take 1 tablet (25 mg total) by mouth 2 (two) times daily as needed for dizziness. 02/17/16   Colon Branch, MD  metoprolol (LOPRESSOR) 50 MG tablet Take 0.5 tablets (25 mg total) by mouth 2 (two) times daily. 11/15/15   Colon Branch, MD  multivitamin [facility]) per tablet Take 1 tablet by mouth daily.      Historical Provider, MD  nitroGLYCERIN (NITROLINGUAL) 0.4 MG/SPRAY spray Place 1 spray under the tongue every 5 (five) minutes x 3 doses as needed. Reported on 09/17/2015    Historical Provider, MD  nystatin ointment (MYCOSTATIN) Apply 1 application topically 2 (two) times daily. 02/17/16   Colon Branch, MD  simvastatin (ZOCOR) 40 MG tablet Take 1 tablet (40 mg total) by mouth at bedtime. 11/15/15   Colon Branch, MD  VOLTAREN 1 % GEL Apply 1 application topically 4 (four) times daily.  01/06/15   Historical Provider, MD  warfarin (COUMADIN) 2 MG tablet Take as directed by Coumadin Clinic. 11/15/15   Colon Branch, MD    Family History Family History  Problem Relation Age of Onset  . Heart disease Mother   . Colon polyps Mother   . Diabetes Mother   . Heart disease Father   . Diabetes Father   . Breast cancer Neg Hx   . Colon cancer Neg Hx     Social History Social History  Substance Use Topics  . Smoking status: Never Smoker  . Smokeless tobacco: Never Used  . Alcohol use No     Allergies     Ciprofloxacin; Penicillins; Sulfamethoxazole-trimethoprim; Sulfonamide derivatives; [name] [ethacrynic acid]; Hydrocodone-acetaminophen; Metronidazole; Prednisone; and Tramadol hcl   Review of Systems Review of Systems  All other systems reviewed and are negative.    Physical Exam Updated Vital Signs BP 138/74 (BP Location: Left Arm)   Pulse 101   Temp 98.6 F (37 C) (Oral)   Resp 16   Ht 5\' 5"  (1.651 m)   Wt 165 lb (74.8 kg)   SpO2 90%   BMI 27.46 kg/m   Physical Exam  Constitutional: She is oriented to  person, place, and time. She appears well-developed and well-nourished. No distress.  HENT:  Head: Normocephalic and atraumatic.  Eyes: EOM are normal.  Neck: Normal range of motion.  Cardiovascular: Normal rate, regular rhythm and normal heart sounds.   Pulmonary/Chest: Effort normal and breath sounds normal.  Abdominal: Soft. She exhibits no distension. There is no tenderness.  Musculoskeletal: Normal range of motion.  Neurological: She is alert and oriented to person, place, and time.  Skin: Skin is warm and dry.  Psychiatric: She has a normal mood and affect. Judgment normal.  Nursing Price and vitals reviewed.    ED Treatments / Results  Labs (all labs ordered are listed, but only abnormal results are displayed) Labs Reviewed  COMPREHENSIVE METABOLIC PANEL - Abnormal; Notable for the following:       Result Value   Glucose, Bld 125 (*)    Calcium 8.7 (*)    ALT 11 (*)    Total Bilirubin 1.9 (*)    All other components within normal limits  CBC - Abnormal; Notable for the following:    WBC 10.6 (*)    All other components within normal limits  PROTIME-INR - Abnormal; Notable for the following:    Prothrombin Time 18.5 (*)    All other components within normal limits  LIPASE, BLOOD  URINALYSIS, ROUTINE W REFLEX MICROSCOPIC (NOT AT [facility])    EKG  EKG Interpretation None       Radiology Dg Chest 2 View  Result Date: 02/26/2016 CLINICAL DATA:   Weakness. EXAM: CHEST  2 VIEW COMPARISON:  Radiographs of March 22, 2015. FINDINGS: Stable cardiomediastinal silhouette. No pneumothorax is noted. Minimal bilateral pleural effusions are noted. Mild bibasilar subsegmental atelectasis is noted which is improved compared to prior exam. Atherosclerosis of thoracic aorta is noted. Bony thorax is unremarkable. IMPRESSION: Probable mild bibasilar subsegmental atelectasis with minimal pleural effusions. Electronically Signed   By: Marijo Conception, M.D.   On: 02/26/2016 18:16    Procedures Procedures (including critical care time)  Medications Ordered in ED Medications  ondansetron (ZOFRAN) injection 4 mg (4 mg Intravenous Given 02/26/16 1650)     Initial Impression / Assessment and Plan / ED Course  I have reviewed the triage vital signs and the nursing notes.  Pertinent labs & imaging results that were available during my care of the patient were reviewed by me and considered in my medical decision making (see chart for details).  Clinical Course    Family also reports some increasing confusion with hallucinations for the past 24 hours which is new for the patient but this is all likely related to mild delirium.  Rectal temp is 99.9.  This will be treated with Tylenol.  She's been hydrated for her nausea vomiting diarrhea.  Her abdominal exam is benign.  Urinalysis is pending at this time.  Care to Dr. Billy Fischer  Final Clinical Impressions(s) / ED Diagnoses   Final diagnoses:  None    New Prescriptions New Prescriptions   No medications on file     Jola Schmidt, MD 02/26/16 1845

## 2016-02-26 NOTE — ED Notes (Signed)
Pt in DG at present time. 

## 2016-02-26 NOTE — ED Notes (Signed)
Bed: [id number] Expected date:  Expected time:  Means of arrival:  Comments: N/v weakness

## 2016-02-26 NOTE — H&P (Addendum)
History and Physical    Virginia Price [id number] DOB: August 22, 1925 DOA: 02/26/2016  PCP: Kathlene November, MD   Patient coming from: Home   Chief Complaint: Nausea, vomiting, diarrhea, confusion   HPI: Virginia Price is a 82 y.o. female with medical history significant for atrial fibrillation on Coumadin, chronic diastolic CHF, hypertension, GERD, and memory loss who presents to the emergency department with 1 day of chills, nausea, vomiting, and diarrhea. Patient reports that she was in her usual state until yesterday when she developed a mild generalized abdominal pain, described as crampy and constant. There was associated nausea, nonbloody nonbilious vomiting, and watery diarrhea. Her symptoms have continued to worsen today, prompting a presentation here. Her family also raises concern for confusion, noting that the patient has been hearing voices and the home and has been intermittently disoriented. The confusion and hallucinations have actually been going on for at least a couple of weeks and the patient was evaluated by her PCP for this earlier in the month, I was attributed to her progressing dementia, and she was referred for home health services. Patient endorses chills, but has not taken her temperature. She denies any recent long distance travel or sick contacts. She has had recurrent UTIs and takes daily Keflex as prophylaxis.  ED Course: Upon arrival to the ED, patient is found to have temperature of 37.7 C, saturating low 90s on room air, tachycardic in the low 100s, and with stable blood pressure. Chest x-ray is notable for mild bibasilar atelectasis and minimal pleural effusions. Chemistry panels notable for a mild elevation in total bilirubin 1.9. CBC features a mild leukocytosis to 10,600. Urinalysis is consistent with infection and lactic acid is reassuring at 1.57. Given the confusion, a noncontrast head CT was obtained, with official read pending, but does not appear to feature  hemorrhage, but hypodensity in the left temporoparietal region suggests old stroke. Patient was given 1 L of normal saline, blood and urine cultures were obtained, and empiric treatment with Rocephin was administered. Tachycardia resolved with fluids and the patient remained hemodynamically stable. She will be observed on the telemetry unit for ongoing evaluation and management of recurrent UTI and nausea, vomiting, and diarrhea.   Review of Systems:  All other systems reviewed and apart from HPI, are negative.  Past Medical History:  Diagnosis Date  . Abnormal chest CT    "innumerable small nodules  . Allergic rhinitis   . Atrial fibrillation (HCC)    Coumadin therapy, dx via  Holter monitor, March, 2012, atrial fib heart rate ranges, no more bradycardia or tachycardia while on digoxin  . Chest pain    Catheterization normal 2004, /  nuclear 2006 no ischemia  . CHF (congestive heart failure) ([place])    Diastolic with fluid overload, normal systolic function,, July, 2012.  Atrial fibrillation  . Colon polyp   . Diverticular disease   . Diverticulosis   . Erosive esophagitis   . Esophageal stricture   . Fibromyalgia    question of  . GERD (gastroesophageal reflux disease)   . Hyperlipidemia   . Hypertension   . Malignant melanoma ([place])   . Memory loss   . Mitral regurgitation    Mild to moderate, echo, January, 2012  . Restless leg syndrome   . Stroke [facility])    (10-03 L MCA territory)    Past Surgical History:  Procedure Laterality Date  . APPENDECTOMY    . CATARACT EXTRACTION Bilateral   . CHOLECYSTECTOMY  1991  .  MOHS SURGERY     03/2005  . RADICAL HYSTERECTOMY  1978   hyst and B oophorectomy     reports that she has never smoked. She has never used smokeless tobacco. She reports that she does not drink alcohol or use drugs.  Allergies  Allergen Reactions  . Ciprofloxacin Hives  . Penicillins Rash  . Sulfamethoxazole-Trimethoprim Rash  . Sulfonamide Derivatives Rash   . [name] [Ethacrynic Acid] Other (See Comments)    UNKNOWN REACTION  . Hydrocodone-Acetaminophen Nausea Only  . Metronidazole Other (See Comments)    UNKNOWN REACTION  . Prednisone Other (See Comments)    UNKNOWN REACTION  . Tramadol Hcl Other (See Comments)    Sleepy    Family History  Problem Relation Age of Onset  . Heart disease Mother   . Colon polyps Mother   . Diabetes Mother   . Heart disease Father   . Diabetes Father   . Breast cancer Neg Hx   . Colon cancer Neg Hx      Prior to Admission medications   Medication Sig Start Date End Date Taking? Authorizing Provider  cephALEXin (KEFLEX) 250 MG capsule Take 250 mg by mouth daily. Daily for UTI prevention   Yes Historical Provider, MD  dicyclomine (BENTYL) 10 MG capsule Take 1 capsule (10 mg total) by mouth 3 (three) times daily before meals. 02/24/15  Yes Ladene Artist, MD  escitalopram (LEXAPRO) 10 MG tablet Take 1 tablet (10 mg total) by mouth daily. 04/27/15  Yes Colon Branch, MD  furosemide (LASIX) 20 MG tablet Take 1 tablet (20 mg total) by mouth daily. 06/03/15  Yes Dorothy Spark, MD  hydrocortisone 2.5 % cream Apply topically 2 (two) times daily. 02/17/16 02/16/17 Yes Colon Branch, MD  meclizine (ANTIVERT) 25 MG tablet Take 1 tablet (25 mg total) by mouth 2 (two) times daily as needed for dizziness. 02/17/16  Yes Colon Branch, MD  metoprolol (LOPRESSOR) 50 MG tablet Take 0.5 tablets (25 mg total) by mouth 2 (two) times daily. 11/15/15  Yes Colon Branch, MD  simvastatin (ZOCOR) 40 MG tablet Take 1 tablet (40 mg total) by mouth at bedtime. 11/15/15  Yes Colon Branch, MD  warfarin (COUMADIN) 2 MG tablet Take as directed by [facility]. Patient taking differently: Take 2-4 mg by mouth daily. Take 1 tablet (2 mg) daily except on Fri Take 2 tablets (4 mg). 11/15/15  Yes Colon Branch, MD  albuterol (PROVENTIL HFA;VENTOLIN HFA) 108 (90 BASE) MCG/ACT inhaler Inhale 2 puffs into the lungs every 6 (six) hours as needed for wheezing or  shortness of breath. 06/29/14   Colon Branch, MD  ezetimibe (ZETIA) 10 MG tablet Take 1 tablet (10 mg total) by mouth daily. Patient not taking: Reported on 02/26/2016 09/21/15   Colon Branch, MD  fluticasone [facility]) 50 MCG/ACT nasal spray Place 2 sprays into the nose as needed for allergies or rhinitis.  09/25/12   Colon Branch, MD  nitroGLYCERIN (NITROLINGUAL) 0.4 MG/SPRAY spray Place 1 spray under the tongue every 5 (five) minutes x 3 doses as needed. Reported on 09/17/2015    Historical Provider, MD  nystatin ointment (MYCOSTATIN) Apply 1 application topically 2 (two) times daily. Patient not taking: Reported on 02/26/2016 02/17/16   Colon Branch, MD  VOLTAREN 1 % GEL Apply 1 application topically 4 (four) times daily.  01/06/15   Historical Provider, MD    Physical Exam: Vitals:   02/26/16 1534 02/26/16 1535  02/26/16 1728 02/26/16 1826  BP: 138/74  120/86   Pulse: 101  95   Resp: 16  16   Temp: 98.6 F (37 C)  98.1 F (36.7 C) 99.9 F (37.7 C)  TempSrc: Oral  Oral Rectal  SpO2: 90%  91%   Weight:  74.8 kg (165 lb)    Height:  5\' 5"  (1.651 m)        Constitutional: NAD, calm, comfortable Eyes: PERTLA, lids and conjunctivae normal ENMT: Mucous membranes are moist. Posterior pharynx clear of any exudate or lesions.   Neck: normal, supple, no masses, no thyromegaly Respiratory: clear to auscultation bilaterally, no wheezing, no crackles. Normal respiratory effort.   Cardiovascular: S1 & S2 heard, regular rate and rhythm, grade 3 systolic murmur at apex. No carotid bruits. No significant JVD. Abdomen: No distension, tender throughout without rebound pain or guarding, no masses palpated. Bowel sounds normal.  Musculoskeletal: no clubbing / cyanosis. No joint deformity upper and lower extremities. Normal muscle tone.  Skin: no significant rashes, lesions, ulcers. Warm, dry, well-perfused. Neurologic: CN 2-12 grossly intact. Sensation intact, DTR normal. Strength 5/5 in all 4 limbs.  Psychiatric:  Normal judgment and insight. Alert and oriented x 3. Normal mood and affect.     Labs on Admission: I have personally reviewed following labs and imaging studies  CBC:  Recent Labs Lab 02/26/16 1538  WBC 10.6*  HGB 13.1  HCT 38.9  MCV 85.7  PLT 123456   Basic Metabolic Panel:  Recent Labs Lab 02/26/16 1538  NA 136  K 3.6  CL 105  CO2 24  GLUCOSE 125*  BUN 13  CREATININE 0.84  CALCIUM 8.7*   GFR: Estimated Creatinine Clearance: 47.7 mL/min (by C-G formula based on SCr of 0.84 mg/dL). Liver Function Tests:  Recent Labs Lab 02/26/16 1538  AST 17  ALT 11*  ALKPHOS 71  BILITOT 1.9*  PROT 6.9  ALBUMIN 3.9    Recent Labs Lab 02/26/16 1538  LIPASE 18   No results for input(s): AMMONIA in the last 168 hours. Coagulation Profile:  Recent Labs Lab 02/26/16 1635  INR 1.52   Cardiac Enzymes: No results for input(s): CKTOTAL, CKMB, CKMBINDEX, TROPONINI in the last 168 hours. BNP (last 3 results) No results for input(s): PROBNP in the last 8760 hours. HbA1C: No results for input(s): HGBA1C in the last 72 hours. CBG: No results for input(s): GLUCAP in the last 168 hours. Lipid Profile: No results for input(s): CHOL, HDL, LDLCALC, TRIG, CHOLHDL, LDLDIRECT in the last 72 hours. Thyroid Function Tests: No results for input(s): TSH, T4TOTAL, FREET4, T3FREE, THYROIDAB in the last 72 hours. Anemia Panel: No results for input(s): VITAMINB12, FOLATE, FERRITIN, TIBC, IRON, RETICCTPCT in the last 72 hours. Urine analysis:    Component Value Date/Time   COLORURINE AMBER (A) 02/26/2016 1538   APPEARANCEUR CLOUDY (A) 02/26/2016 1538   LABSPEC 1.021 02/26/2016 1538   PHURINE 5.0 02/26/2016 1538   GLUCOSEU NEGATIVE 02/26/2016 1538   GLUCOSEU NEG mg/dL 01/15/2007 2125   HGBUR SMALL (A) 02/26/2016 1538   HGBUR negative 02/01/2010 1043   BILIRUBINUR NEGATIVE 02/26/2016 1538   BILIRUBINUR small 09/08/2011 1301   KETONESUR NEGATIVE 02/26/2016 1538   PROTEINUR NEGATIVE  02/26/2016 1538   UROBILINOGEN 0.2 09/08/2011 1301   UROBILINOGEN 0.2 03/18/2011 1705   NITRITE POSITIVE (A) 02/26/2016 1538   LEUKOCYTESUR SMALL (A) 02/26/2016 1538   Sepsis Labs: @LABRCNTIP (procalcitonin:4,lacticidven:4) )No results found for this or any previous visit (from the past 240 hour(s)).  Radiological Exams on Admission: Dg Chest 2 View  Result Date: 02/26/2016 CLINICAL DATA:  Weakness. EXAM: CHEST  2 VIEW COMPARISON:  Radiographs of March 22, 2015. FINDINGS: Stable cardiomediastinal silhouette. No pneumothorax is noted. Minimal bilateral pleural effusions are noted. Mild bibasilar subsegmental atelectasis is noted which is improved compared to prior exam. Atherosclerosis of thoracic aorta is noted. Bony thorax is unremarkable. IMPRESSION: Probable mild bibasilar subsegmental atelectasis with minimal pleural effusions. Electronically Signed   By: Marijo Conception, M.D.   On: 02/26/2016 18:16    EKG: Not performed, will obtain as appropriate.   Assessment/Plan  1. Nausea, vomiting, watery diarrhea  - Possibly d/t pyelonephritis vs acute GE  - Pt non-toxic and no peritoneal signs on admission  - Replaces fluid-losses and replete lytes prn  - Check GI panel and C diff; infection-control measures with enteric precautions  - Symptomatic care with antiemetics, analgesia, antipyretics    2. Recurrent UTI - Pt takes daily Keflex as suppressive therapy; prior culture grew E coli with intermediate sensitivity to nitrofurantoin - Urine sent for culture  - Continue treatment with empiric Rocephin while awaiting culture data   3. Chronic atrial fibrillation  - CHADS-VASc at least 66 (age x2, gender, CHF, HTN, hx CVA) - Check INR and continue AC with warfarin  - Rate is well-controlled with Lopressor, will continue    4. Chronic diastolic CHF  - TTE (A999333) with EF 65-70%, mild MR and LAE, moderate TR  - Appears a little dry on admission following 1 L NS in ED  - Continue IVF  with NS at 90 cc/hr overnight  - Follow daily weights and strict I/O's  - Lasix held at time of admission in setting of vomiting and diarrhea; metoprolol continued    5. Confusional state - Pt is A&O x4 on admission with no focal neurologic deficit identified  - Head CT with no acute intracranial abnormalities - Has been evaluated by her PCP earlier this month, attributed to progressive dementia, and was referred for home health services   6. Hypertension  - At goal currently, will continue Lopressor as tolerated   7. GERD - Erosive esophagitis on remote EGD (2003)  - Will treat with IV Pepcid BID given the current illness with N/V and intolerance of diet   ADDENDUM: pt had acute episode with SOB and HR up to 120's (apparently a fib on telemetry) just prior to going upstairs. EKG and BNP ordered. A fib, will given 10 mg IVP diltiazem. Wheezes on exam, neb treatment ordered, will follow-up BNP and stop IVF as appropriate.    DVT prophylaxis: warfarin Code Status: DNR Family Communication: Discussed with patient Disposition Plan: Observe on telemetry Consults called: None Admission status: Observation     Vianne Bulls, MD [facility] Pager [phone]  If 7PM-7AM, please contact night-coverage [email or web address] Password [email or web address]  02/26/2016, 7:58 PM

## 2016-02-26 NOTE — ED Notes (Signed)
Disregard order change/discontinue sodium chloride 0.9% bolus 1,000 ml. Error in charting. Bolus has been completed as ordered.

## 2016-02-27 DIAGNOSIS — Z85828 Personal history of other malignant neoplasm of skin: Secondary | ICD-10-CM | POA: Diagnosis not present

## 2016-02-27 DIAGNOSIS — Z79899 Other long term (current) drug therapy: Secondary | ICD-10-CM | POA: Diagnosis not present

## 2016-02-27 DIAGNOSIS — E876 Hypokalemia: Secondary | ICD-10-CM | POA: Diagnosis not present

## 2016-02-27 DIAGNOSIS — Z8673 Personal history of transient ischemic attack (TIA), and cerebral infarction without residual deficits: Secondary | ICD-10-CM | POA: Diagnosis not present

## 2016-02-27 DIAGNOSIS — I481 Persistent atrial fibrillation: Secondary | ICD-10-CM | POA: Diagnosis present

## 2016-02-27 DIAGNOSIS — Z87440 Personal history of urinary (tract) infections: Secondary | ICD-10-CM | POA: Diagnosis not present

## 2016-02-27 DIAGNOSIS — R440 Auditory hallucinations: Secondary | ICD-10-CM | POA: Diagnosis present

## 2016-02-27 DIAGNOSIS — R2681 Unsteadiness on feet: Secondary | ICD-10-CM | POA: Diagnosis not present

## 2016-02-27 DIAGNOSIS — I110 Hypertensive heart disease with heart failure: Secondary | ICD-10-CM | POA: Diagnosis present

## 2016-02-27 DIAGNOSIS — R531 Weakness: Secondary | ICD-10-CM | POA: Diagnosis not present

## 2016-02-27 DIAGNOSIS — Z8249 Family history of ischemic heart disease and other diseases of the circulatory system: Secondary | ICD-10-CM | POA: Diagnosis not present

## 2016-02-27 DIAGNOSIS — R410 Disorientation, unspecified: Secondary | ICD-10-CM | POA: Diagnosis not present

## 2016-02-27 DIAGNOSIS — R279 Unspecified lack of coordination: Secondary | ICD-10-CM | POA: Diagnosis not present

## 2016-02-27 DIAGNOSIS — R000 Tachycardia, unspecified: Secondary | ICD-10-CM | POA: Diagnosis present

## 2016-02-27 DIAGNOSIS — Z66 Do not resuscitate: Secondary | ICD-10-CM | POA: Diagnosis present

## 2016-02-27 DIAGNOSIS — Z86010 Personal history of colonic polyps: Secondary | ICD-10-CM | POA: Diagnosis not present

## 2016-02-27 DIAGNOSIS — M6281 Muscle weakness (generalized): Secondary | ICD-10-CM | POA: Diagnosis not present

## 2016-02-27 DIAGNOSIS — A045 Campylobacter enteritis: Secondary | ICD-10-CM | POA: Diagnosis present

## 2016-02-27 DIAGNOSIS — K219 Gastro-esophageal reflux disease without esophagitis: Secondary | ICD-10-CM | POA: Diagnosis present

## 2016-02-27 DIAGNOSIS — R278 Other lack of coordination: Secondary | ICD-10-CM | POA: Diagnosis not present

## 2016-02-27 DIAGNOSIS — E785 Hyperlipidemia, unspecified: Secondary | ICD-10-CM | POA: Diagnosis present

## 2016-02-27 DIAGNOSIS — R5381 Other malaise: Secondary | ICD-10-CM | POA: Diagnosis not present

## 2016-02-27 DIAGNOSIS — N12 Tubulo-interstitial nephritis, not specified as acute or chronic: Secondary | ICD-10-CM | POA: Diagnosis present

## 2016-02-27 DIAGNOSIS — N390 Urinary tract infection, site not specified: Secondary | ICD-10-CM | POA: Diagnosis present

## 2016-02-27 DIAGNOSIS — F0390 Unspecified dementia without behavioral disturbance: Secondary | ICD-10-CM | POA: Diagnosis present

## 2016-02-27 DIAGNOSIS — B9620 Unspecified Escherichia coli [E. coli] as the cause of diseases classified elsewhere: Secondary | ICD-10-CM | POA: Diagnosis present

## 2016-02-27 DIAGNOSIS — I5032 Chronic diastolic (congestive) heart failure: Secondary | ICD-10-CM | POA: Diagnosis not present

## 2016-02-27 DIAGNOSIS — Z7901 Long term (current) use of anticoagulants: Secondary | ICD-10-CM | POA: Diagnosis not present

## 2016-02-27 DIAGNOSIS — Z880 Allergy status to penicillin: Secondary | ICD-10-CM | POA: Diagnosis not present

## 2016-02-27 DIAGNOSIS — Z882 Allergy status to sulfonamides status: Secondary | ICD-10-CM | POA: Diagnosis not present

## 2016-02-27 DIAGNOSIS — G9341 Metabolic encephalopathy: Secondary | ICD-10-CM | POA: Diagnosis not present

## 2016-02-27 DIAGNOSIS — R197 Diarrhea, unspecified: Secondary | ICD-10-CM | POA: Diagnosis present

## 2016-02-27 LAB — CBC WITH DIFFERENTIAL/PLATELET
BASOS ABS: 0 10*3/uL (ref 0.0–0.1)
BASOS PCT: 0 %
EOS PCT: 0 %
Eosinophils Absolute: 0 10*3/uL (ref 0.0–0.7)
HCT: 35.9 % — ABNORMAL LOW (ref 36.0–46.0)
Hemoglobin: 11.9 g/dL — ABNORMAL LOW (ref 12.0–15.0)
Lymphocytes Relative: 7 %
Lymphs Abs: 0.7 10*3/uL (ref 0.7–4.0)
MCH: 28.4 pg (ref 26.0–34.0)
MCHC: 33.1 g/dL (ref 30.0–36.0)
MCV: 85.7 fL (ref 78.0–100.0)
MONO ABS: 0.6 10*3/uL (ref 0.1–1.0)
Monocytes Relative: 6 %
Neutro Abs: 8.8 10*3/uL — ABNORMAL HIGH (ref 1.7–7.7)
Neutrophils Relative %: 87 %
PLATELETS: 222 10*3/uL (ref 150–400)
RBC: 4.19 MIL/uL (ref 3.87–5.11)
RDW: 15.1 % (ref 11.5–15.5)
WBC: 10.1 10*3/uL (ref 4.0–10.5)

## 2016-02-27 LAB — COMPREHENSIVE METABOLIC PANEL
ALBUMIN: 3.8 g/dL (ref 3.5–5.0)
ALT: 11 U/L — ABNORMAL LOW (ref 14–54)
ANION GAP: 5 (ref 5–15)
AST: 14 U/L — AB (ref 15–41)
Alkaline Phosphatase: 63 U/L (ref 38–126)
BUN: 13 mg/dL (ref 6–20)
CHLORIDE: 108 mmol/L (ref 101–111)
CO2: 22 mmol/L (ref 22–32)
Calcium: 8.2 mg/dL — ABNORMAL LOW (ref 8.9–10.3)
Creatinine, Ser: 0.8 mg/dL (ref 0.44–1.00)
GFR calc Af Amer: >60 mL/min (ref >60)
Glucose, Bld: 125 mg/dL — ABNORMAL HIGH (ref 65–99)
POTASSIUM: 3.6 mmol/L (ref 3.5–5.1)
Sodium: 135 mmol/L (ref 135–145)
Total Bilirubin: 2 mg/dL — ABNORMAL HIGH (ref 0.3–1.2)
Total Protein: 6.6 g/dL (ref 6.5–8.1)

## 2016-02-27 LAB — MAGNESIUM: MAGNESIUM: 1.7 mg/dL (ref 1.7–2.4)

## 2016-02-27 LAB — LACTIC ACID, PLASMA: Lactic Acid, Venous: 1 mmol/L (ref 0.5–1.9)

## 2016-02-27 LAB — GLUCOSE, CAPILLARY: GLUCOSE-CAPILLARY: 97 mg/dL (ref 65–99)

## 2016-02-27 LAB — PROTIME-INR
INR: 1.47
PROTHROMBIN TIME: 18 s — AB (ref 11.4–15.2)

## 2016-02-27 MED ORDER — ORAL CARE MOUTH RINSE
15.0000 mL | Freq: Two times a day (BID) | OROMUCOSAL | Status: DC
  Administered 2016-02-28 – 2016-03-01 (×5): 15 mL via OROMUCOSAL

## 2016-02-27 MED ORDER — WARFARIN SODIUM 6 MG PO TABS
6.0000 mg | ORAL_TABLET | Freq: Once | ORAL | Status: AC
  Administered 2016-02-27: 6 mg via ORAL
  Filled 2016-02-27: qty 1

## 2016-02-27 NOTE — Care Management Obs Status (Signed)
[facility] NOTIFICATION   Patient Details  Name: Virginia Price MRN: [id number] Date of Birth: [birth year]   Medicare Observation Status Notification Given:  Yes    Erenest Rasher, RN 02/27/2016, 5:30 PM

## 2016-02-27 NOTE — Care Management Note (Signed)
Case Management Note  Patient Details  Name: Virginia Price MRN: [id number] Date of Birth: 20-Jun-1925  Subjective/Objective:     Nausea, vomiting and diarrhea, UTI               Action/Plan: Discharge Planning:  NCM spoke to pt's dtr, Lucilla Edin [phone] and Fritzi Mandes # [phone] at bedside. They are requesting pt dc to SNF, [facility] in [place], Pt lives at home alone. Waiting PT recommendations. CSW referral for SNF placement.    Expected Discharge Date:              Expected Discharge Plan:  Skilled Nursing Facility  In-House Referral:  Clinical Social Work  Discharge planning Services  CM Consult  Post Acute Care Choice:  NA Choice offered to:  NA  DME Arranged:  N/A DME Agency:  NA  HH Arranged:  NA HH Agency:  NA  Status of Service:  Completed, signed off  If discussed at [place] of Stay Meetings, dates discussed:    Additional Comments:  Erenest Rasher, RN 02/27/2016, 5:32 PM

## 2016-02-27 NOTE — Progress Notes (Signed)
Patient ID: JAMILE WASKO, female   DOB: Mar 29, 1926, 82 y.o.   MRN: [id number]    PROGRESS NOTE    LOANA WAFER  [id number] DOB: November 22, 1925 DOA: 02/26/2016  PCP: Kathlene November, MD   Brief Narrative:   82 y.o. female with medical history significant for atrial fibrillation on Coumadin, chronic diastolic CHF, hypertension, GERD, and memory loss who presented to the emergency department with 1 day of chills, nausea, vomiting, and diarrhea. Patient reports that she was in her usual state until one day PTA when she developed a mild generalized abdominal pain, described as crampy and constant. There was associated nausea, nonbloody nonbilious vomiting, and watery diarrhea. Her symptoms have continued to worsen and family has noted more confusion, hallucinations, poor oral intake and they feels as if pt is unable to take care of herself any longer.   Assessment & Plan:   1. Nausea, vomiting, watery diarrhea  - Possibly d/t pyelonephritis vs acute GE  - appears to be better this AM but still with nausea and diarrhea - final urine cultures pending  - follow up on stool studies  - Symptomatic care with antiemetics, analgesia, antipyretics    2. Recurrent UTI - Pt takes daily Keflex as suppressive therapy; prior culture grew E coli with intermediate sensitivity to nitrofurantoin - Urine sent for culture and still pending  - Continue treatment with empiric Rocephin while awaiting culture data, day #2 of Rocephin   3. Chronic atrial fibrillation  - CHADS-VASc at least 74 (age x2, gender, CHF, HTN, hx CVA) - Check INR and continue AC with warfarin  - Rate is well-controlled with Lopressor, will continue    4. Chronic diastolic CHF  - TTE (A999333) with EF 65-70%, mild MR and LAE, moderate TR  - had more dyspnea overnight, so will have to hold off on IVF for now and see how pt does  - Follow daily weights and strict I/O's  - Lasix held at time of admission in setting of vomiting and  diarrhea; metoprolol continued   - attempt to advance diet   5. Acute metabolic encephalopathy secondary to UTI, ? Acute viral gastroenteritis, ? Pyelonephritis  - in pt with known dementia, may be also progressive dementia  - Head CT with no acute intracranial abnormalities - more clear this am but still some confusion on today's exam   6. Hypertension, essential  - At goal currently, will continue Lopressor as tolerated   7. GERD - Erosive esophagitis on remote EGD (2003)  - Continue IV Pepcid BID given the current illness with N/V and intolerance of diet   DVT prophylaxis: warfarin  Code Status: DNR Family Communication: son at bedside  Disposition Plan: to be determined, son thinks she will need higher level of care   Consultants:   None  Procedures:   None  Antimicrobials:   Rocephin 9/30 -->  Subjective: Reports she feels tired, poor oral intake.   Objective: Vitals:   02/26/16 2347 02/27/16 0605 02/27/16 0759 02/27/16 1117  BP: 118/60 131/77 136/72 (!) 160/80  Pulse: 90 91 91 98  Resp: 20 20    Temp:  98.9 F (37.2 C)    TempSrc:  Oral    SpO2: 98% 95%    Weight:  77.3 kg (170 lb 8 oz)    Height:        Intake/Output Summary (Last 24 hours) at 02/27/16 1213 Last data filed at 02/27/16 1200  Gross per 24 hour  Intake  942 ml  Output              101 ml  Net              841 ml   Filed Weights   02/26/16 1535 02/26/16 2155 02/27/16 0605  Weight: 74.8 kg (165 lb) 76.1 kg (167 lb 12.8 oz) 77.3 kg (170 lb 8 oz)    Examination:  General exam: Appears calm and comfortable  Respiratory system: diminished breath sounds at bases  Cardiovascular system: S1 & S2 heard, RRR. No JVD, rubs, gallops or clicks. No pedal edema. Gastrointestinal system: Abdomen is nondistended, soft and nontender. No organomegaly or masses felt. Normal bowel sounds heard. Central nervous system: Alert and oriented to name and place, strength equal bilaterally in  upper extremities and on dorsiflexion/plantarflexion bilaterally, unable to bend legs at the knees (says it is too painful), CN 2-12 grossly intact   Data Reviewed: I have personally reviewed following labs and imaging studies  CBC:  Recent Labs Lab 02/26/16 1538 02/27/16 0130  WBC 10.6* 10.1  NEUTROABS  --  8.8*  HGB 13.1 11.9*  HCT 38.9 35.9*  MCV 85.7 85.7  PLT 211 [id number]   Basic Metabolic Panel:  Recent Labs Lab 02/26/16 1538 02/27/16 0130  NA 136 135  K 3.6 3.6  CL 105 108  CO2 24 22  GLUCOSE 125* 125*  BUN 13 13  CREATININE 0.84 0.80  CALCIUM 8.7* 8.2*  MG  --  1.7   Liver Function Tests:  Recent Labs Lab 02/26/16 1538 02/27/16 0130  AST 17 14*  ALT 11* 11*  ALKPHOS 71 63  BILITOT 1.9* 2.0*  PROT 6.9 6.6  ALBUMIN 3.9 3.8    Recent Labs Lab 02/26/16 1538  LIPASE 18   Coagulation Profile:  Recent Labs Lab 02/26/16 1635 02/26/16 1959 02/27/16 0130  INR 1.52 1.33 1.47   CBG:  Recent Labs Lab 02/27/16 0816  GLUCAP 97   Urine analysis:    Component Value Date/Time   COLORURINE AMBER (A) 02/26/2016 1538   APPEARANCEUR CLOUDY (A) 02/26/2016 1538   LABSPEC 1.021 02/26/2016 1538   PHURINE 5.0 02/26/2016 1538   GLUCOSEU NEGATIVE 02/26/2016 1538   GLUCOSEU NEG mg/dL 01/15/2007 2125   HGBUR SMALL (A) 02/26/2016 1538   HGBUR negative 02/01/2010 1043   BILIRUBINUR NEGATIVE 02/26/2016 1538   BILIRUBINUR small 09/08/2011 1301   KETONESUR NEGATIVE 02/26/2016 1538   PROTEINUR NEGATIVE 02/26/2016 1538   UROBILINOGEN 0.2 09/08/2011 1301   UROBILINOGEN 0.2 03/18/2011 1705   NITRITE POSITIVE (A) 02/26/2016 1538   LEUKOCYTESUR SMALL (A) 02/26/2016 1538   Radiology Studies: Dg Chest 2 View  Result Date: 02/26/2016 CLINICAL DATA:  Weakness. EXAM: CHEST  2 VIEW COMPARISON:  Radiographs of March 22, 2015. FINDINGS: Stable cardiomediastinal silhouette. No pneumothorax is noted. Minimal bilateral pleural effusions are noted. Mild bibasilar  subsegmental atelectasis is noted which is improved compared to prior exam. Atherosclerosis of thoracic aorta is noted. Bony thorax is unremarkable. IMPRESSION: Probable mild bibasilar subsegmental atelectasis with minimal pleural effusions. Electronically Signed   By: Marijo Conception, M.D.   On: 02/26/2016 18:16   Ct Head Wo Contrast  Result Date: 02/26/2016 CLINICAL DATA:  Altered mental status. EXAM: CT HEAD WITHOUT CONTRAST TECHNIQUE: Contiguous axial images were obtained from the base of the skull through the vertex without intravenous contrast. COMPARISON:  Brain MRI 06/16/2010 FINDINGS: Brain: Remote infarction in the LEFT temporal lobe. No acute intracranial hemorrhage. No focal mass lesion. No  CT evidence of acute infarction. No midline shift or mass effect. No hydrocephalus. Basilar cisterns are patent. There are periventricular and subcortical white matter hypodensities. Generalized cortical atrophy. Vascular: No hyperdense vessel or unexpected calcification. Skull: Normal. Negative for fracture or focal lesion. Sinuses/Orbits: Paranasal sinuses and mastoid air cells are clear. Orbits are clear. Other: None. IMPRESSION: 1. No acute intracranial findings. 2. Remote LEFT temporal infarction. 3. Chronic atrophy and white matter microvascular disease. Electronically Signed   By: Suzy Bouchard M.D.   On: 02/26/2016 20:29      Scheduled Meds: . cefTRIAXone (ROCEPHIN)  IV  1 g Intravenous Q24H  . diclofenac sodium  1 application Topical QID  . escitalopram  10 mg Oral Daily  . famotidine (PEPCID) IV  20 mg Intravenous Q12H  . mouth rinse  15 mL Mouth Rinse BID  . metoprolol  25 mg Oral BID  . simvastatin  40 mg Oral QHS  . sodium chloride flush  3 mL Intravenous Q12H  . warfarin  6 mg Oral Once  . Warfarin - Pharmacist Dosing Inpatient   Does not apply q1800   Continuous Infusions:    LOS: 0 days    Time spent: 20 minutes    Faye Ramsay, MD [facility] Pager  [phone]  If 7PM-7AM, please contact night-coverage [email or web address] Password [email or web address] 02/27/2016, 12:13 PM

## 2016-02-27 NOTE — Progress Notes (Signed)
ANTICOAGULATION CONSULT NOTE - Follow up [place] for warfarin Indication: atrial fibrillation  Allergies  Allergen Reactions  . Ciprofloxacin Hives  . Penicillins Rash  . Sulfamethoxazole-Trimethoprim Rash  . Sulfonamide Derivatives Rash  . [name] [Ethacrynic Acid] Other (See Comments)    UNKNOWN REACTION  . Hydrocodone-Acetaminophen Nausea Only  . Metronidazole Other (See Comments)    UNKNOWN REACTION  . Prednisone Other (See Comments)    UNKNOWN REACTION  . Tramadol Hcl Other (See Comments)    Sleepy    Patient Measurements: Height: 5\' 7"  (170.2 cm) Weight: 170 lb 8 oz (77.3 kg) IBW/kg (Calculated) : 61.6  Vital Signs: Temp: 98.9 F (37.2 C) (10/01 0605) Temp Source: Oral (10/01 0605) BP: 136/72 (10/01 0759) Pulse Rate: 91 (10/01 0759)  Labs:  Recent Labs  02/26/16 1538 02/26/16 1635 02/26/16 1959 02/27/16 0130  HGB 13.1  --   --  11.9*  HCT 38.9  --   --  35.9*  PLT 211  --   --  222  LABPROT  --  18.5* 16.6* 18.0*  INR  --  1.52 1.33 1.47  CREATININE 0.84  --   --  0.80    Estimated Creatinine Clearance: 53.1 mL/min (by C-G formula based on SCr of 0.8 mg/dL).   Medications:  Scheduled:  . cefTRIAXone (ROCEPHIN)  IV  1 g Intravenous Q24H  . diclofenac sodium  1 application Topical QID  . escitalopram  10 mg Oral Daily  . famotidine (PEPCID) IV  20 mg Intravenous Q12H  . mouth rinse  15 mL Mouth Rinse BID  . metoprolol  25 mg Oral BID  . simvastatin  40 mg Oral QHS  . sodium chloride flush  3 mL Intravenous Q12H  . Warfarin - Pharmacist Dosing Inpatient   Does not apply q1800    Assessment: 51 yoF admitted on 9/30 for N/V/D and confusion, UTI.  PMH includes chronic warfarin for afib.  Pharmacy is consulted to resume warfarin dosing. PTA warfarin 2 mg PO daily except 4 mg PO on Fridays.  Admission INR 1.3.  Today,02/27/16  INR 1.47, remains subtherapeutic  CBC:  Hgb decreased (13.1 > 11.9), plt WNL  No bleeding or  complications reported  Diet: clear liquids only.  No major drug-drug interactions.  Broad spectrum antibiotics may increase INR.   Goal of Therapy:  INR 2-3 Monitor platelets by anticoagulation protocol: Yes   Plan:   Warfarin 6 mg PO x1 today at 1200.  Daily PT/INR.  Monitor for signs and symptoms of bleeding.   Gretta Arab PharmD, BCPS Pager [phone] 02/27/2016 11:23 AM

## 2016-02-28 LAB — CBC
HCT: 36.9 % (ref 36.0–46.0)
Hemoglobin: 12.2 g/dL (ref 12.0–15.0)
MCH: 28.4 pg (ref 26.0–34.0)
MCHC: 33.1 g/dL (ref 30.0–36.0)
MCV: 85.8 fL (ref 78.0–100.0)
PLATELETS: 201 10*3/uL (ref 150–400)
RBC: 4.3 MIL/uL (ref 3.87–5.11)
RDW: 15.1 % (ref 11.5–15.5)
WBC: 12.5 10*3/uL — ABNORMAL HIGH (ref 4.0–10.5)

## 2016-02-28 LAB — BASIC METABOLIC PANEL
Anion gap: 11 (ref 5–15)
BUN: 13 mg/dL (ref 6–20)
CO2: 23 mmol/L (ref 22–32)
CREATININE: 0.92 mg/dL (ref 0.44–1.00)
Calcium: 8.6 mg/dL — ABNORMAL LOW (ref 8.9–10.3)
Chloride: 104 mmol/L (ref 101–111)
GFR calc Af Amer: >60 mL/min (ref >60)
GFR, EST NON AFRICAN AMERICAN: 54 mL/min — AB (ref >60)
GLUCOSE: 160 mg/dL — AB (ref 65–99)
Potassium: 3.6 mmol/L (ref 3.5–5.1)
SODIUM: 138 mmol/L (ref 135–145)

## 2016-02-28 LAB — GASTROINTESTINAL PANEL BY PCR, STOOL (REPLACES STOOL CULTURE)
ASTROVIRUS: NOT DETECTED
Adenovirus F40/41: NOT DETECTED
CYCLOSPORA CAYETANENSIS: NOT DETECTED
Campylobacter species: DETECTED — AB
Cryptosporidium: NOT DETECTED
ENTAMOEBA HISTOLYTICA: NOT DETECTED
ENTEROAGGREGATIVE E COLI (EAEC): NOT DETECTED
ENTEROTOXIGENIC E COLI (ETEC): NOT DETECTED
Enteropathogenic E coli (EPEC): NOT DETECTED
GIARDIA LAMBLIA: NOT DETECTED
NOROVIRUS GI/GII: NOT DETECTED
Plesimonas shigelloides: NOT DETECTED
Rotavirus A: NOT DETECTED
SALMONELLA SPECIES: NOT DETECTED
SAPOVIRUS (I, II, IV, AND V): NOT DETECTED
Shiga like toxin producing E coli (STEC): NOT DETECTED
Shigella/Enteroinvasive E coli (EIEC): NOT DETECTED
VIBRIO CHOLERAE: NOT DETECTED
VIBRIO SPECIES: NOT DETECTED
Yersinia enterocolitica: NOT DETECTED

## 2016-02-28 LAB — PROTIME-INR
INR: 2.49
PROTHROMBIN TIME: 27.4 s — AB (ref 11.4–15.2)

## 2016-02-28 LAB — GLUCOSE, CAPILLARY: GLUCOSE-CAPILLARY: 121 mg/dL — AB (ref 65–99)

## 2016-02-28 LAB — C DIFFICILE QUICK SCREEN W PCR REFLEX
C DIFFICLE (CDIFF) ANTIGEN: NEGATIVE
C Diff interpretation: NOT DETECTED
C Diff toxin: NEGATIVE

## 2016-02-28 MED ORDER — FUROSEMIDE 20 MG PO TABS
20.0000 mg | ORAL_TABLET | Freq: Every day | ORAL | Status: DC
  Administered 2016-02-28 – 2016-03-01 (×3): 20 mg via ORAL
  Filled 2016-02-28 (×3): qty 1

## 2016-02-28 MED ORDER — WARFARIN SODIUM 2 MG PO TABS
2.0000 mg | ORAL_TABLET | Freq: Once | ORAL | Status: AC
  Administered 2016-02-28: 2 mg via ORAL
  Filled 2016-02-28: qty 1

## 2016-02-28 MED ORDER — FUROSEMIDE 10 MG/ML IJ SOLN
40.0000 mg | Freq: Once | INTRAMUSCULAR | Status: AC
  Administered 2016-02-28: 40 mg via INTRAVENOUS
  Filled 2016-02-28: qty 4

## 2016-02-28 MED ORDER — VITAMINS A & D EX OINT
TOPICAL_OINTMENT | CUTANEOUS | Status: AC
  Administered 2016-02-28: 14:00:00
  Filled 2016-02-28: qty 5

## 2016-02-28 NOTE — Clinical Social Work Note (Signed)
Clinical Social Work Assessment  Patient Details  Name: CYRSTAL ALARIE MRN: [id number] Date of Birth: 1925-12-10  Date of referral:  02/28/16               Reason for consult:  Facility Placement, Discharge Planning                Permission sought to share information with:  Case Manager, Customer service manager, Family Supports Permission granted to share information::  Yes, Verbal Permission Granted  Name::        Agency::  [facility]  Relationship::  Daughters (in room) Katharine Look and Archivist Information:     Housing/Transportation Living arrangements for the past 2 months:  Single Family Home Source of Information:  Patient, Medical Team, Adult Children Patient Interpreter Needed:  None Criminal Activity/Legal Involvement Pertinent to Current Situation/Hospitalization:  No - Comment as needed Significant Relationships:  Adult Children, Other Family Members Lives with:  Adult Children Do you feel safe going back to the place where you live?  No Need for family participation in patient care:  Yes (Comment)  Care giving concerns:  Daughters voice patient is coming from home and struggling to care safely for patient. As much as they would like her home they understand the limitations and feel patient would be safer in a rehabilitation facility at discharge.  Patient fluctuates with orientation.   Social Worker assessment / plan:  LCSW received consult for facility placement with family preference being [facility]. LCSW spoke with daughters who are in agreement with plan. Patient apprehensive, but unable to care for self at this time. LCSW explained process and role in hospital and completed work up for SNF. Facility of choice notified of referral in which they are reviewing and will follow up. LCSW will follow and continue to assist with DC planning and SNF placement.  Employment status:  Retired Nurse, adult PT Recommendations:  [place], [place] / Referral to community resources:  [place]  Patient/Family's Response to care:  Agreeable to plan  Patient/Family's Understanding of and Emotional Response to Diagnosis, Current Treatment, and Prognosis:  Family very reasonable and understanding with current needs of patient and involved in care.  Emotional Assessment Appearance:  Appears stated age Attitude/Demeanor/Rapport:    Affect (typically observed):  Adaptable, Apprehensive, Anxious Orientation:  Oriented to Self Alcohol / Substance use:  Not Applicable Psych involvement (Current and /or in the community):  No (Comment)  Discharge Needs  Concerns to be addressed:  No discharge needs identified Readmission within the last 30 days:  No Current discharge risk:  None Barriers to Discharge:  Continued Medical Work up   Lilly Cove, LCSW 02/28/2016, 12:27 PM

## 2016-02-28 NOTE — Progress Notes (Signed)
ANTICOAGULATION CONSULT NOTE - Follow up [place] for warfarin Indication: atrial fibrillation  Allergies  Allergen Reactions  . Ciprofloxacin Hives  . Penicillins Rash  . Sulfamethoxazole-Trimethoprim Rash  . Sulfonamide Derivatives Rash  . [name] [Ethacrynic Acid] Other (See Comments)    UNKNOWN REACTION  . Hydrocodone-Acetaminophen Nausea Only  . Metronidazole Other (See Comments)    UNKNOWN REACTION  . Prednisone Other (See Comments)    UNKNOWN REACTION  . Tramadol Hcl Other (See Comments)    Sleepy    Patient Measurements: Height: 5\' 7"  (170.2 cm) Weight: 164 lb 11.2 oz (74.7 kg) IBW/kg (Calculated) : 61.6  Vital Signs: Temp: 97.9 F (36.6 C) (10/02 0748) Temp Source: Oral (10/02 0748) BP: 129/77 (10/02 0503) Pulse Rate: 104 (10/02 0503)  Labs:  Recent Labs  02/26/16 1538  02/26/16 1959 02/27/16 0130 02/28/16 0403  HGB 13.1  --   --  11.9* 12.2  HCT 38.9  --   --  35.9* 36.9  PLT 211  --   --  222 201  LABPROT  --   < > 16.6* 18.0* 27.4*  INR  --   < > 1.33 1.47 2.49  CREATININE 0.84  --   --  0.80 0.92  < > = values in this interval not displayed.  Estimated Creatinine Clearance: 45.4 mL/min (by C-G formula based on SCr of 0.92 mg/dL).   Medications:  Scheduled:  . cefTRIAXone (ROCEPHIN)  IV  1 g Intravenous Q24H  . diclofenac sodium  1 application Topical QID  . escitalopram  10 mg Oral Daily  . famotidine (PEPCID) IV  20 mg Intravenous Q12H  . mouth rinse  15 mL Mouth Rinse BID  . metoprolol  25 mg Oral BID  . simvastatin  40 mg Oral QHS  . sodium chloride flush  3 mL Intravenous Q12H  . Warfarin - Pharmacist Dosing Inpatient   Does not apply q1800    Assessment: 63 yoF admitted on 9/30 for N/V/D and confusion, UTI.  PMH includes chronic warfarin for afib.  Pharmacy is consulted to resume warfarin dosing. PTA warfarin 2 mg PO daily except 4 mg PO on Fridays.  Admission INR 1.3.  Today,02/28/16  INR 2.49,  therapeutic  CBC and plt WNL  No bleeding or complications reported  Diet: soft  No major drug-drug interactions.  Broad spectrum antibiotics may increase INR.   Goal of Therapy:  INR 2-3 Monitor platelets by anticoagulation protocol: Yes   Plan:   Warfarin 2 mg PO x1 today at 1800  Daily PT/INR.  Monitor for signs and symptoms of bleeding.   Dolly Rias RPh 02/28/2016, 9:53 AM Pager [phone]

## 2016-02-28 NOTE — Progress Notes (Signed)
CRITICAL VALUE ALERT  Critical value received:  campylobacter  Date of notification: 02/28/16  Time of notification: 0900   Critical value read back:Yes.    Nurse who received alert:  Watt Climes  MD notified (1st page):  myers  Time of first page:  0930  MD notified (2nd page):  Time of second page:  Responding MD: Doyle Askew  Time MD responded:  [phone]

## 2016-02-28 NOTE — Progress Notes (Signed)
Patient ID: Virginia Price, female   DOB: 09/08/1925, 82 y.o.   MRN: [id number]    PROGRESS NOTE    Virginia Price  [id number] DOB: 13-Jan-1926 DOA: 02/26/2016  PCP: Kathlene November, MD   Brief Narrative:   82 y.o. female with medical history significant for atrial fibrillation on Coumadin, chronic diastolic CHF, hypertension, GERD, and memory loss who presented to the emergency department with 1 day of chills, nausea, vomiting, and diarrhea. Patient reports that she was in her usual state until one day PTA when she developed a mild generalized abdominal pain, described as crampy and constant. There was associated nausea, nonbloody nonbilious vomiting, and watery diarrhea. Her symptoms have continued to worsen and family has noted more confusion, hallucinations, poor oral intake and they feels as if pt is unable to take care of herself any longer.   Assessment & Plan:   1. Nausea, vomiting, watery diarrhea  - Possibly d/t pyelonephritis, acute GI infection, stool panel positive for Campylobacter  - appears to be better this AM, denies vomiting but still with nausea  - final urine cultures pending  - Symptomatic care with antiemetics, analgesia, antipyretics    2. Recurrent UTI - Pt takes daily Keflex as suppressive therapy; prior culture grew E coli with intermediate sensitivity to nitrofurantoin - Urine sent for culture and still pending  - Continue treatment with empiric Rocephin while awaiting culture data, day #3 of Rocephin   3. Chronic atrial fibrillation  - CHADS-VASc at least 56 (age x2, gender, CHF, HTN, hx CVA) - Check INR and continue AC with warfarin  - Rate is well-controlled with Lopressor, will continue    4. Chronic diastolic CHF  - TTE ([id number]) with EF 65-70%, mild MR and LAE, moderate TR  - had more dyspnea overnight, so will have to hold off on IVF for now and see how pt does  - Follow daily weights and strict I/O's  - Lasix held at time of admission in setting of  vomiting and diarrhea; metoprolol continued   - resume Lasix in AM   5. Acute metabolic encephalopathy secondary to UTI, ? Acute viral gastroenteritis, ? Pyelonephritis  - in pt with known dementia, may be also progressive dementia  - Head CT with no acute intracranial abnormalities - more clear this am but still some confusion on today's exam   6. Hypertension, essential  - currently in 100's, close monitoring   7. GERD - Erosive esophagitis on remote EGD (2003)  - Continue IV Pepcid BID given the current illness with N/V and intolerance of diet   DVT prophylaxis: warfarin  Code Status: DNR Family Communication: daughters at bedside  Disposition Plan: to be determined, likely SNF in 2 days   Consultants:   None  Procedures:   None  Antimicrobials:   Rocephin 9/30 -->  Subjective: Reports she feels better this AM.   Objective: Vitals:   02/28/16 0700 02/28/16 0748 02/28/16 1318 02/28/16 1400  BP:   (!) 99/58 (!) 106/54  Pulse:   77   Resp:   18   Temp:  97.9 F (36.6 C) 98.3 F (36.8 C)   TempSrc:  Oral Axillary   SpO2:   97% 99%  Weight: 74.7 kg (164 lb 11.2 oz)     Height:        Intake/Output Summary (Last 24 hours) at 02/28/16 1825 Last data filed at 02/28/16 1320  Gross per 24 hour  Intake  150 ml  Output             1753 ml  Net            -1603 ml   Filed Weights   02/26/16 2155 02/27/16 0605 02/28/16 0700  Weight: 76.1 kg (167 lb 12.8 oz) 77.3 kg (170 lb 8 oz) 74.7 kg (164 lb 11.2 oz)    Examination:  General exam: Appears calm and comfortable  Respiratory system: diminished breath sounds at bases  Cardiovascular system: S1 & S2 heard, RRR. No JVD, rubs, gallops or clicks. No pedal edema. Gastrointestinal system: Abdomen is nondistended, soft, mild tenderness in left upper quadrant. No organomegaly or masses felt.  Central nervous system: Alert and oriented to name and place, strength equal bilaterally in upper extremities and  on dorsiflexion/plantarflexion bilaterally, unable to bend legs at the knees (says it is too painful), CN 2-12 grossly intact   Data Reviewed: I have personally reviewed following labs and imaging studies  CBC:  Recent Labs Lab 02/26/16 1538 02/27/16 0130 02/28/16 0403  WBC 10.6* 10.1 12.5*  NEUTROABS  --  8.8*  --   HGB 13.1 11.9* 12.2  HCT 38.9 35.9* 36.9  MCV 85.7 85.7 85.8  PLT 211 222 [id number]   Basic Metabolic Panel:  Recent Labs Lab 02/26/16 1538 02/27/16 0130 02/28/16 0403  NA 136 135 138  K 3.6 3.6 3.6  CL 105 108 104  CO2 24 22 23   GLUCOSE 125* 125* 160*  BUN 13 13 13   CREATININE 0.84 0.80 0.92  CALCIUM 8.7* 8.2* 8.6*  MG  --  1.7  --    Liver Function Tests:  Recent Labs Lab 02/26/16 1538 02/27/16 0130  AST 17 14*  ALT 11* 11*  ALKPHOS 71 63  BILITOT 1.9* 2.0*  PROT 6.9 6.6  ALBUMIN 3.9 3.8    Recent Labs Lab 02/26/16 1538  LIPASE 18   Coagulation Profile:  Recent Labs Lab 02/26/16 1635 02/26/16 1959 02/27/16 0130 02/28/16 0403  INR 1.52 1.33 1.47 2.49   CBG:  Recent Labs Lab 02/27/16 0816 02/28/16 0757  GLUCAP 97 121*   Urine analysis:    Component Value Date/Time   COLORURINE AMBER (A) 02/26/2016 1538   APPEARANCEUR CLOUDY (A) 02/26/2016 1538   LABSPEC 1.021 02/26/2016 1538   PHURINE 5.0 02/26/2016 1538   GLUCOSEU NEGATIVE 02/26/2016 1538   GLUCOSEU NEG mg/dL 01/15/2007 2125   HGBUR SMALL (A) 02/26/2016 1538   HGBUR negative 02/01/2010 1043   BILIRUBINUR NEGATIVE 02/26/2016 1538   BILIRUBINUR small 09/08/2011 1301   KETONESUR NEGATIVE 02/26/2016 1538   PROTEINUR NEGATIVE 02/26/2016 1538   UROBILINOGEN 0.2 09/08/2011 1301   UROBILINOGEN 0.2 03/18/2011 1705   NITRITE POSITIVE (A) 02/26/2016 1538   LEUKOCYTESUR SMALL (A) 02/26/2016 1538   Radiology Studies: Ct Head Wo Contrast  Result Date: 02/26/2016 CLINICAL DATA:  Altered mental status. EXAM: CT HEAD WITHOUT CONTRAST TECHNIQUE: Contiguous axial images were obtained  from the base of the skull through the vertex without intravenous contrast. COMPARISON:  Brain MRI 06/16/2010 FINDINGS: Brain: Remote infarction in the LEFT temporal lobe. No acute intracranial hemorrhage. No focal mass lesion. No CT evidence of acute infarction. No midline shift or mass effect. No hydrocephalus. Basilar cisterns are patent. There are periventricular and subcortical white matter hypodensities. Generalized cortical atrophy. Vascular: No hyperdense vessel or unexpected calcification. Skull: Normal. Negative for fracture or focal lesion. Sinuses/Orbits: Paranasal sinuses and mastoid air cells are clear. Orbits are clear. Other: None. IMPRESSION: 1.  No acute intracranial findings. 2. Remote LEFT temporal infarction. 3. Chronic atrophy and white matter microvascular disease. Electronically Signed   By: Suzy Bouchard M.D.   On: 02/26/2016 20:29      Scheduled Meds: . cefTRIAXone (ROCEPHIN)  IV  1 g Intravenous Q24H  . diclofenac sodium  1 application Topical QID  . escitalopram  10 mg Oral Daily  . famotidine (PEPCID) IV  20 mg Intravenous Q12H  . mouth rinse  15 mL Mouth Rinse BID  . metoprolol  25 mg Oral BID  . simvastatin  40 mg Oral QHS  . sodium chloride flush  3 mL Intravenous Q12H  . Warfarin - Pharmacist Dosing Inpatient   Does not apply q1800   Continuous Infusions:    LOS: 1 day    Time spent: 20 minutes    Faye Ramsay, MD [facility] Pager [phone]  If 7PM-7AM, please contact night-coverage [email or web address] Password [facility] 02/28/2016, 6:25 PM

## 2016-02-28 NOTE — Clinical Social Work Placement (Signed)
   CLINICAL SOCIAL WORK PLACEMENT  NOTE  Date:  02/28/2016  Patient Details  Name: Virginia Price MRN: [id number] Date of Birth: Aug 18, 1925  Clinical Social Work is seeking post-discharge placement for this patient at the [place] level of care (*CSW will initial, date and re-position this form in  chart as items are completed):  Yes   Patient/family provided with [place] Work Department's list of facilities offering this level of care within the geographic area requested by the patient (or if unable, by the patient's family).  Yes   Patient/family informed of their freedom to choose among providers that offer the needed level of care, that participate in Medicare, Medicaid or managed care program needed by the patient, have an available bed and are willing to accept the patient.  Yes   Patient/family informed of [facility]'s ownership interest in [facility] and [facility], as well as of the fact that they are under no obligation to receive care at these facilities.  PASRR submitted to EDS on 02/28/16     PASRR number received on 02/28/16     Existing PASRR number confirmed on       FL2 transmitted to all facilities in geographic area requested by pt/family on 02/28/16     FL2 transmitted to all facilities within larger geographic area on       Patient informed that his/her managed care company has contracts with or will negotiate with certain facilities, including the following:            Patient/family informed of bed offers received.  Patient chooses bed at       Physician recommends and patient chooses bed at      Patient to be transferred to   on  .  Patient to be transferred to facility by       Patient family notified on   of transfer.  Name of family member notified:        PHYSICIAN Please sign FL2     Additional Comment:    _______________________________________________ Lilly Cove, LCSW 02/28/2016,  10:09 AM

## 2016-02-28 NOTE — Progress Notes (Signed)
OT Cancellation Note  Patient Details Name: Virginia Price MRN: [id number] DOB: 1926/04/12   Cancelled Treatment:     Pt sleeping soundly and no family present. Noted likely plan for SNF- will recheck on pt later in day or next day for OT eval   Rees Matura, Mickel Baas, [place] [phone] 02/28/2016, 1:01 PM

## 2016-02-28 NOTE — Evaluation (Signed)
Physical Therapy Evaluation Patient Details Name: Virginia Price MRN: [id number] DOB: August 13, 1925 Today's Date: 02/28/2016   History of Present Illness  82 yo female admitted with recurrent UTI, N/V/D. hx of A fib, CHF, HTN  Clinical Impression  On eval, pt required Min assist for mobility. She was able to perform a stand pivot, bed>recliner, with a RW. Pt c/o L knee pain-attributes this to arthritis. O2 sats briefly dropped into mid 80s%, during activity, while on 4L O2. Dyspnea 2/4. Family present during session. Recommend ST rehab at SNF. Pt's family is agreeable and they stated they would discuss rehab with pt.     Follow Up Recommendations SNF    Equipment Recommendations  None recommended by PT    Recommendations for Other Services OT consult     Precautions / Restrictions Precautions Precautions: Fall Precaution Comments: monitor O2 sats Restrictions Weight Bearing Restrictions: No      Mobility  Bed Mobility Overal bed mobility: Needs Assistance Bed Mobility: Supine to Sit     Supine to sit: HOB elevated;Min assist     General bed mobility comments: Assist for L LE and to scoot to EOB. Utilized bedpad to aid with scooting. Pt used bedrail as well. Increased time required. Cues for technique. O2 sats briefly dropped into mid 80s%.   Transfers Overall transfer level: Needs assistance Equipment used: Rolling Dobberstein (2 wheeled) Transfers: Sit to/from [facility] Sit to Stand: Min assist;From elevated surface Stand pivot transfers: Min assist       General transfer comment: Assist to rise, stabilize, control descent. Stand pivot, bed >recliner, with RW. Increased time required.   Ambulation/Gait             General Gait Details: NT  Stairs            Wheelchair Mobility    Modified Rankin (Stroke Patients Only)       Balance Overall balance assessment: Needs assistance;History of Falls         Standing balance support:  Bilateral upper extremity supported Standing balance-Leahy Scale: Poor                               Pertinent Vitals/Pain Pain Assessment: Faces Faces Pain Scale: Hurts even more Pain Location: L knee with activity-arthritis Pain Descriptors / Indicators: Sore;Aching Pain Intervention(s): Limited activity within patient's tolerance;Repositioned    Home Living Family/patient expects to be discharged to:: Private residence Living Arrangements: Alone Available Help at Discharge: Family;Available PRN/intermittently Type of Home: House         Home Equipment: [place] - 2 wheels;Bedside commode      Prior Function Level of Independence: Needs assistance   Gait / Transfers Assistance Needed: pt was using a rw for ambulation but only for limited household distances  ADL's / Homemaking Assistance Needed: family assisting with bathing and meals  Comments: multiple falls within the last year     Hand Dominance        Extremity/Trunk Assessment   Upper Extremity Assessment: Generalized weakness           Lower Extremity Assessment: Generalized weakness      Cervical / Trunk Assessment: Kyphotic  Communication   Communication: HOH  Cognition Arousal/Alertness: Awake/alert Behavior During Therapy: WFL for tasks assessed/performed Overall Cognitive Status: Within Functional Limits for tasks assessed  General Comments      Exercises     Assessment/Plan    PT Assessment Patient needs continued PT services  PT Problem List Decreased strength;Decreased mobility;Decreased range of motion;Decreased activity tolerance;Decreased balance;Pain;Decreased knowledge of use of DME          PT Treatment Interventions DME instruction;Gait training;Therapeutic activities;Patient/family education;Functional mobility training;Balance training;Therapeutic exercise    PT Goals (Current goals can be found in the Care Plan section)  Acute  Rehab PT Goals Patient Stated Goal: to be able to drive/leave house PT Goal Formulation: With patient/family Time For Goal Achievement: 03/13/16 Potential to Achieve Goals: Good    Frequency Min 3X/week   Barriers to discharge        Co-evaluation               End of Session Equipment Utilized During Treatment: Gait belt;Oxygen Activity Tolerance: Patient limited by fatigue;Patient limited by pain Patient left: in chair;with call bell/phone within reach;with chair alarm set;with family/visitor present           Time: [id number] PT Time Calculation (min) (ACUTE ONLY): 21 min   Charges:   PT Evaluation $PT Eval Low Complexity: 1 Procedure     PT G Codes:        Weston Anna, MPT Pager: [phone]

## 2016-02-28 NOTE — Progress Notes (Signed)
Pt went into resp distress around 02:30 this am. Was found incont of urine and stool, gown off, heart rate elevated in 160's, and oxygen saturations in the 80's on 2L. Pt was very anxious, sounded wet and was having chills.  Gave prn breathing treatment and NP on call was notified.  New orders received for Lasix IV and to place a foley cath. Patient monitored closely through remainder of night.

## 2016-02-28 NOTE — NC FL2 (Signed)
[place] LEVEL OF CARE SCREENING TOOL     IDENTIFICATION  Patient Name: Virginia Price Birthdate: September 20, 1925 Sex: female Admission Date (Current Location): 02/26/2016  [facility] and [place] Number:  Herbalist and Address:  [facility],  [place] [address] [place]      Provider Number: [id number]  Attending Physician Name and Address:  Theodis Blaze, MD  Relative Name and Phone Number:       Current Level of Care: Hospital Recommended Level of Care: [place] Prior Approval Number:    Date Approved/Denied:   PASRR Number:    Discharge Plan: SNF    Current Diagnoses: Patient Active Problem List   Diagnosis Date Noted  . Acute lower UTI (urinary tract infection) 02/27/2016  . UTI (urinary tract infection) 02/26/2016  . Nausea vomiting and diarrhea 02/26/2016  . Confusion with non-focal neuro exam 02/26/2016  . UTI (lower urinary tract infection) 02/26/2016  . Gait disorder 09/18/2015  . Chronic anticoagulation 06/03/2015  . Persistent atrial fibrillation ([place]) 06/03/2015  . Orthostatic hypotension 06/03/2015  . Acute on chronic diastolic CHF (congestive heart failure), NYHA class 3 ([place]) 06/03/2015  . PCP NOTES >>>>> 04/20/2015  . PCP comments --Depression 10/15/2014  . DJD (degenerative joint disease) 06/14/2014  . Lung nodule 10/01/2013  . Aortic insufficiency 09/05/2013  . Aortic valve sclerosis 09/05/2013  . Shortness of breath 08/20/2013  . Encounter for therapeutic drug monitoring 08/06/2013  . Warfarin anticoagulation 02/04/2012  . Dyspepsia 11/03/2011  . Annual physical exam 09/10/2011  . Ejection fraction <   . Recurrent UTI 02/28/2011  . Mitral regurgitation   . Chronic atrial fibrillation ([place])   . Chronic diastolic CHF (congestive heart failure) ([place]) 12/12/2010  . Chest pain   . GERD (gastroesophageal reflux disease)   . Abnormal chest CT   . PCP comments--DIZZINESS 06/02/2010  . IBS  05/06/2009  . BACK PAIN 08/10/2008  . MALIGNANT MELANOMA, SKIN 11/15/2007  . CARCINOMA, BASAL CELL, HX OF 11/15/2007  . PERSONAL HX COLONIC POLYPS 11/15/2007  . OVERACTIVE BLADDER 06/13/2007  . Dementia 06/13/2007  . ALLERGIC RHINITIS 03/16/2007  . GERD 03/16/2007  . RESTLESS LEG SYNDROME 01/30/2007  . Hyperlipidemia 06/02/2006  . Essential hypertension 06/02/2006    Orientation RESPIRATION BLADDER Height & Weight     Self  O2 (4L) Incontinent Weight: 164 lb 11.2 oz (74.7 kg) Height:  5\' 7"  (170.2 cm)  BEHAVIORAL SYMPTOMS/MOOD NEUROLOGICAL BOWEL NUTRITION STATUS      Incontinent Diet (Soft diet)  AMBULATORY STATUS COMMUNICATION OF NEEDS Skin   Extensive Assist Verbally Normal                       Personal Care Assistance Level of Assistance  Bathing, Feeding, Dressing Bathing Assistance: Limited assistance Feeding assistance: Limited assistance Dressing Assistance: Limited assistance     Functional Limitations Info  Sight, Hearing, Speech Sight Info: Adequate Hearing Info: Impaired Speech Info: Adequate    SPECIAL CARE FACTORS FREQUENCY  PT (By licensed PT), OT (By licensed OT)     PT Frequency: 5 OT Frequency: 5            Contractures Contractures Info: Not present    Additional Factors Info  Code Status, Allergies, Isolation Precautions, Psychotropic (DNR) Code Status Info: DNR Allergies Info: Ciprofloxacin, Penicillins, Sulfamethoxazole-trimethoprim, Sulfonamide Derivatives, [name] Ethacrynic Acid, Hydrocodone-acetaminophen, Metronidazole, Prednisone, Tramadol Hcl Psychotropic Info: Lexapro   Isolation Precautions Info: on contact Enteric  Current Medications (02/28/2016):  This is the current hospital active medication list Current Facility-Administered Medications  Medication Dose Route Frequency Provider Last Rate Last Dose  . acetaminophen (TYLENOL) tablet 650 mg  650 mg Oral Q6H PRN Vianne Bulls, MD   650 mg at 02/28/16 0600   Or  .  acetaminophen (TYLENOL) suppository 650 mg  650 mg Rectal Q6H PRN Vianne Bulls, MD      . cefTRIAXone (ROCEPHIN) 1 g in dextrose 5 % 50 mL IVPB  1 g Intravenous Q24H Vianne Bulls, MD   1 g at 02/27/16 2021  . diclofenac sodium (VOLTAREN) 1 % transdermal gel 1 application  1 application Topical QID Vianne Bulls, MD   1 application at [id number] 0949  . escitalopram (LEXAPRO) tablet 10 mg  10 mg Oral Daily Vianne Bulls, MD   10 mg at 02/28/16 0948  . famotidine (PEPCID) IVPB 20 mg premix  20 mg Intravenous Q12H Vianne Bulls, MD   20 mg at 02/28/16 0949  . HYDROcodone-acetaminophen (NORCO/VICODIN) 5-325 MG per tablet 1-2 tablet  1-2 tablet Oral Q4H PRN Ilene Qua Opyd, MD      . ipratropium-albuterol (DUONEB) 0.5-2.5 (3) MG/3ML nebulizer solution 3 mL  3 mL Nebulization Q4H PRN Vianne Bulls, MD      . meclizine (ANTIVERT) tablet 25 mg  25 mg Oral BID PRN Vianne Bulls, MD      . MEDLINE mouth rinse  15 mL Mouth Rinse BID Ilene Qua Opyd, MD      . metoprolol tartrate (LOPRESSOR) tablet 25 mg  25 mg Oral BID Vianne Bulls, MD   25 mg at 02/28/16 0949  . ondansetron (ZOFRAN) tablet 4 mg  4 mg Oral Q6H PRN Vianne Bulls, MD       Or  . ondansetron (ZOFRAN) injection 4 mg  4 mg Intravenous Q6H PRN Vianne Bulls, MD      . simvastatin (ZOCOR) tablet 40 mg  40 mg Oral QHS Vianne Bulls, MD   40 mg at 02/28/16 0328  . sodium chloride flush (NS) 0.9 % injection 3 mL  3 mL Intravenous Q12H Vianne Bulls, MD   3 mL at 02/28/16 0949  . warfarin (COUMADIN) tablet 2 mg  2 mg Oral ONCE-1800 Angela Adam, [facility]      . Warfarin - Pharmacist Dosing Inpatient   Does not apply q1800 Royetta Asal, [facility]         Discharge Medications: Please see discharge summary for a list of discharge medications.  Relevant Imaging Results:  Relevant Lab Results:   Additional Information SSn:  [id number]  Lilly C[place], LCSW

## 2016-02-29 LAB — BASIC METABOLIC PANEL
Anion gap: 8 (ref 5–15)
BUN: 15 mg/dL (ref 6–20)
CHLORIDE: 104 mmol/L (ref 101–111)
CO2: 26 mmol/L (ref 22–32)
CREATININE: 0.8 mg/dL (ref 0.44–1.00)
Calcium: 8.3 mg/dL — ABNORMAL LOW (ref 8.9–10.3)
GFR calc Af Amer: >60 mL/min (ref >60)
GFR calc non Af Amer: >60 mL/min (ref >60)
GLUCOSE: 100 mg/dL — AB (ref 65–99)
Potassium: 2.8 mmol/L — ABNORMAL LOW (ref 3.5–5.1)
SODIUM: 138 mmol/L (ref 135–145)

## 2016-02-29 LAB — GLUCOSE, CAPILLARY: GLUCOSE-CAPILLARY: 93 mg/dL (ref 65–99)

## 2016-02-29 LAB — CBC
HCT: 36.5 % (ref 36.0–46.0)
HEMOGLOBIN: 12.2 g/dL (ref 12.0–15.0)
MCH: 28.4 pg (ref 26.0–34.0)
MCHC: 33.4 g/dL (ref 30.0–36.0)
MCV: 84.9 fL (ref 78.0–100.0)
Platelets: 223 10*3/uL (ref 150–400)
RBC: 4.3 MIL/uL (ref 3.87–5.11)
RDW: 15 % (ref 11.5–15.5)
WBC: 6.9 10*3/uL (ref 4.0–10.5)

## 2016-02-29 LAB — URINE CULTURE

## 2016-02-29 LAB — PROTIME-INR
INR: 2.55
PROTHROMBIN TIME: 27.9 s — AB (ref 11.4–15.2)

## 2016-02-29 MED ORDER — WARFARIN SODIUM 2 MG PO TABS
2.0000 mg | ORAL_TABLET | Freq: Once | ORAL | Status: AC
  Administered 2016-02-29: 2 mg via ORAL
  Filled 2016-02-29: qty 1

## 2016-02-29 MED ORDER — NITROFURANTOIN MONOHYD MACRO 100 MG PO CAPS
100.0000 mg | ORAL_CAPSULE | Freq: Two times a day (BID) | ORAL | Status: DC
  Administered 2016-02-29 – 2016-03-01 (×3): 100 mg via ORAL
  Filled 2016-02-29 (×4): qty 1

## 2016-02-29 MED ORDER — LORAZEPAM 0.5 MG PO TABS
0.5000 mg | ORAL_TABLET | Freq: Once | ORAL | Status: AC
  Administered 2016-02-29: 0.5 mg via ORAL
  Filled 2016-02-29: qty 1

## 2016-02-29 MED ORDER — FAMOTIDINE 20 MG PO TABS
20.0000 mg | ORAL_TABLET | Freq: Two times a day (BID) | ORAL | Status: DC
  Administered 2016-02-29 – 2016-03-01 (×2): 20 mg via ORAL
  Filled 2016-02-29 (×2): qty 1

## 2016-02-29 MED ORDER — POTASSIUM CHLORIDE CRYS ER 20 MEQ PO TBCR
40.0000 meq | EXTENDED_RELEASE_TABLET | ORAL | Status: AC
  Administered 2016-02-29 (×2): 40 meq via ORAL
  Filled 2016-02-29 (×2): qty 2

## 2016-02-29 NOTE — Care Management Note (Signed)
Case Management Note  Patient Details  Name: Virginia Price MRN: [id number] Date of Birth: 1925/09/29  Subjective/Objective: 82 y/o f admitted w/Recurrent UTI. From home.PT-recc SNF. CSW following for SNF.                   Action/Plan:d/c plan SNF.   Expected Discharge Date:                 Expected Discharge Plan:  Skilled Nursing Facility  In-House Referral:  Clinical Social Work  Discharge planning Services  CM Consult  Post Acute Care Choice:  NA Choice offered to:  NA  DME Arranged:  N/A DME Agency:  NA  HH Arranged:  NA HH Agency:  NA  Status of Service:  Completed, signed off  If discussed at [place] of Stay Meetings, dates discussed:    Additional Comments:  Dessa Phi, RN 02/29/2016, 12:06 PM

## 2016-02-29 NOTE — Progress Notes (Signed)
ANTICOAGULATION CONSULT NOTE - Follow up [place] for warfarin Indication: atrial fibrillation  Allergies  Allergen Reactions  . Ciprofloxacin Hives  . Penicillins Rash  . Sulfamethoxazole-Trimethoprim Rash  . Sulfonamide Derivatives Rash  . [name] [Ethacrynic Acid] Other (See Comments)    UNKNOWN REACTION  . Hydrocodone-Acetaminophen Nausea Only  . Metronidazole Other (See Comments)    UNKNOWN REACTION  . Prednisone Other (See Comments)    UNKNOWN REACTION  . Tramadol Hcl Other (See Comments)    Sleepy    Patient Measurements: Height: 5\' 7"  (170.2 cm) Weight: 166 lb 10.7 oz (75.6 kg) IBW/kg (Calculated) : 61.6  Vital Signs: Temp: 98 F (36.7 C) (10/03 0537) Temp Source: Oral (10/03 0537) BP: 132/67 (10/03 0537) Pulse Rate: 84 (10/03 0537)  Labs:  Recent Labs  02/27/16 0130 02/28/16 0403 02/29/16 0520  HGB 11.9* 12.2 12.2  HCT 35.9* 36.9 36.5  PLT 222 201 223  LABPROT 18.0* 27.4* 27.9*  INR 1.47 2.49 2.55  CREATININE 0.80 0.92 0.80    Estimated Creatinine Clearance: 52.6 mL/min (by C-G formula based on SCr of 0.8 mg/dL).   Medications:  Scheduled:  . diclofenac sodium  1 application Topical QID  . escitalopram  10 mg Oral Daily  . famotidine (PEPCID) IV  20 mg Intravenous Q12H  . furosemide  20 mg Oral Daily  . mouth rinse  15 mL Mouth Rinse BID  . metoprolol  25 mg Oral BID  . nitrofurantoin (macrocrystal-monohydrate)  100 mg Oral Q12H  . potassium chloride  40 mEq Oral Q4H  . simvastatin  40 mg Oral QHS  . sodium chloride flush  3 mL Intravenous Q12H  . Warfarin - Pharmacist Dosing Inpatient   Does not apply q1800    Assessment: 75 yoF admitted on 9/30 for N/V/D and confusion, UTI.  PMH includes chronic warfarin for afib.  Pharmacy is consulted to resume warfarin dosing. PTA warfarin 2 mg PO daily except 4 mg PO on Fridays.  Admission INR 1.3.  Today,02/29/16  INR 2.55, therapeutic  CBC and plt WNL  No bleeding or  complications reported  Diet: soft  No major drug-drug interactions.  Broad spectrum antibiotics may increase INR.   Goal of Therapy:  INR 2-3 Monitor platelets by anticoagulation protocol: Yes   Plan:   Warfarin 2 mg PO x1 today at 1800  Daily PT/INR.  Monitor for signs and symptoms of bleeding.   Dolly Rias RPh 02/29/2016, 9:59 AM Pager [phone]

## 2016-02-29 NOTE — Progress Notes (Signed)
Patient ID: Virginia Price, female   DOB: Feb 23, 1926, 82 y.o.   MRN: [id number]    PROGRESS NOTE    Virginia Price  [id number] DOB: 1925/11/13 DOA: 02/26/2016  PCP: Kathlene November, MD   Brief Narrative:   82 y.o. female with medical history significant for atrial fibrillation on Coumadin, chronic diastolic CHF, hypertension, GERD, and memory loss who presented to the emergency department with 1 day of chills, nausea, vomiting, and diarrhea. Patient reports that she was in her usual state until one day PTA when she developed a mild generalized abdominal pain, described as crampy and constant. There was associated nausea, nonbloody nonbilious vomiting, and watery diarrhea. Her symptoms have continued to worsen and family has noted more confusion, hallucinations, poor oral intake and they feels as if pt is unable to take care of herself any longer.   Major events since admission: 10/03 - more confusion today but overall better, added low dose ativan prn   Assessment & Plan:   1. Nausea, vomiting, watery diarrhea  - suspect due to acute GI infection, stool panel positive for Campylobacter  - appears to be better this AM, denies vomiting but still with nausea  - better oral intake  - final urine cultures pending  - Symptomatic care with antiemetics, analgesia, antipyretics    2. Recurrent UTI, E. Coli  - Pt takes daily Keflex as suppressive therapy; prior culture grew E coli with intermediate sensitivity to nitrofurantoin - urine culture with E. Coli but resistant to Rocephin, will change to PO Cipro today so today will essential be day #1 of ABX   3. Chronic atrial fibrillation  - CHADS-VASc at least 61 (age x2, gender, CHF, HTN, hx CVA) - Check INR and continue AC with warfarin  - Rate is well-controlled with Lopressor, will continue   - pt agitated with monitor, d/c tele for now   4. Chronic diastolic CHF  - TTE ([id number]) with EF 65-70%, mild MR and LAE, moderate TR  - had more  dyspnea overnight, so will have to hold off on IVF for now and see how pt does  - Follow daily weights and strict I/O's  - Lasix held at time of admission in setting of vomiting and diarrhea; metoprolol continued   - will resume today   5. Acute metabolic encephalopathy secondary to UTI, ? Acute viral gastroenteritis, ? Pyelonephritis  - in pt with known dementia, may be also progressive dementia  - Head CT with no acute intracranial abnormalities - alert this am but oriented to name and place only, more agitated at time per RN - added low dose ativan as needed   6. Hypertension, essential  - currently in 100's, close monitoring   7. GERD - Erosive esophagitis on remote EGD (2003)  - Continue IV Pepcid BID given the current illness with N/V and intolerance of diet   8. Hypokalemia - supplement, repeat BMP in AM  DVT prophylaxis: warfarin  Code Status: DNR Family Communication: daughters at bedside  Disposition Plan: SNF in 1-2 if more alert and less agitated, also would like to see K level stable before discharge  Consultants:   None  Procedures:   None  Antimicrobials:   Rocephin 9/30 --> 10/03  Cipro 10/03 -->  Subjective: More restless this AM, trying to get out of the bed.   Objective: Vitals:   02/28/16 2125 02/29/16 0500 02/29/16 0537 02/29/16 1358  BP:   132/67 (!) 106/55  Pulse:   84 87  Resp:   18 18  Temp:   98 F (36.7 C) 97.8 F (36.6 C)  TempSrc:   Oral Oral  SpO2: 98%  95% 95%  Weight:  75.6 kg (166 lb 10.7 oz)    Height:        Intake/Output Summary (Last 24 hours) at 02/29/16 1605 Last data filed at 02/29/16 1404  Gross per 24 hour  Intake              360 ml  Output              601 ml  Net             -241 ml   Filed Weights   02/27/16 0605 02/28/16 0700 02/29/16 0500  Weight: 77.3 kg (170 lb 8 oz) 74.7 kg (164 lb 11.2 oz) 75.6 kg (166 lb 10.7 oz)    Examination:  General exam: Appears restless, trying to get out of the bed  but able to follow commands appropriately  Respiratory system: diminished breath sounds at bases  Cardiovascular system: S1 & S2 heard, RRR. No JVD, rubs, gallops or clicks. No pedal edema. Gastrointestinal system: Abdomen is nondistended, soft, mild tenderness in left upper quadrant.  Central nervous system: Alert and oriented to name and place, strength equal bilaterally in upper extremities and on dorsiflexion/plantarflexion bilaterally, unable to bend legs at the knees (says it is too painful), CN 2-12 grossly intact   Data Reviewed: I have personally reviewed following labs and imaging studies  CBC:  Recent Labs Lab 02/26/16 1538 02/27/16 0130 02/28/16 0403 02/29/16 0520  WBC 10.6* 10.1 12.5* 6.9  NEUTROABS  --  8.8*  --   --   HGB 13.1 11.9* 12.2 12.2  HCT 38.9 35.9* 36.9 36.5  MCV 85.7 85.7 85.8 84.9  PLT 211 222 201 [id number]   Basic Metabolic Panel:  Recent Labs Lab 02/26/16 1538 02/27/16 0130 02/28/16 0403 02/29/16 0520  NA 136 135 138 138  K 3.6 3.6 3.6 2.8*  CL 105 108 104 104  CO2 24 22 23 26   GLUCOSE 125* 125* 160* 100*  BUN 13 13 13 15   CREATININE 0.84 0.80 0.92 0.80  CALCIUM 8.7* 8.2* 8.6* 8.3*  MG  --  1.7  --   --    Liver Function Tests:  Recent Labs Lab 02/26/16 1538 02/27/16 0130  AST 17 14*  ALT 11* 11*  ALKPHOS 71 63  BILITOT 1.9* 2.0*  PROT 6.9 6.6  ALBUMIN 3.9 3.8    Recent Labs Lab 02/26/16 1538  LIPASE 18   Coagulation Profile:  Recent Labs Lab 02/26/16 1635 02/26/16 1959 02/27/16 0130 02/28/16 0403 02/29/16 0520  INR 1.52 1.33 1.47 2.49 2.55   CBG:  Recent Labs Lab 02/27/16 0816 02/28/16 0757 02/29/16 0728  GLUCAP 97 121* 93   Urine analysis:    Component Value Date/Time   COLORURINE AMBER (A) 02/26/2016 1538   APPEARANCEUR CLOUDY (A) 02/26/2016 1538   LABSPEC 1.021 02/26/2016 1538   PHURINE 5.0 02/26/2016 1538   GLUCOSEU NEGATIVE 02/26/2016 1538   GLUCOSEU NEG mg/dL 01/15/2007 2125   HGBUR SMALL (A)  02/26/2016 1538   HGBUR negative 02/01/2010 1043   BILIRUBINUR NEGATIVE 02/26/2016 1538   BILIRUBINUR small 09/08/2011 1301   KETONESUR NEGATIVE 02/26/2016 1538   PROTEINUR NEGATIVE 02/26/2016 1538   UROBILINOGEN 0.2 09/08/2011 1301   UROBILINOGEN 0.2 03/18/2011 1705   NITRITE POSITIVE (A) 02/26/2016 1538   LEUKOCYTESUR SMALL (A) 02/26/2016 1538   Radiology  Studies: No results found.    Scheduled Meds: . diclofenac sodium  1 application Topical QID  . escitalopram  10 mg Oral Daily  . famotidine  20 mg Oral BID  . furosemide  20 mg Oral Daily  . LORazepam  0.5 mg Oral Once  . mouth rinse  15 mL Mouth Rinse BID  . metoprolol  25 mg Oral BID  . nitrofurantoin (macrocrystal-monohydrate)  100 mg Oral Q12H  . simvastatin  40 mg Oral QHS  . sodium chloride flush  3 mL Intravenous Q12H  . warfarin  2 mg Oral ONCE-1800  . Warfarin - Pharmacist Dosing Inpatient   Does not apply q1800   Continuous Infusions:    LOS: 2 days    Time spent: 20 minutes    Faye Ramsay, MD [facility] Pager [phone]  If 7PM-7AM, please contact night-coverage [email or web address] Password [email or web address] 02/29/2016, 4:05 PM

## 2016-02-29 NOTE — Evaluation (Signed)
Occupational Therapy Evaluation Patient Details Name: Virginia Price MRN: [id number] DOB: 1926-01-11 Today's Date: 02/29/2016    History of Present Illness 82 yo female admitted with recurrent UTI, N/V/D. hx of A fib, CHF, HTN   Clinical Impression   This 82 year old female was admitted for the above. At time of evaluation, she was not oriented but followed commands. Per chart, she lived alone:  It appears she has intermittent assistance. She will benefit from continued OT to increase safety and independence with adls. Goals are for min guard level; she is currently min A.    Follow Up Recommendations  SNF    Equipment Recommendations  None recommended by OT    Recommendations for Other Services       Precautions / Restrictions Precautions Precautions: Fall Precaution Comments: monitor O2 sats Restrictions Weight Bearing Restrictions: No      Mobility Bed Mobility         Supine to sit: Min assist;HOB elevated     General bed mobility comments: assist for trunk  Transfers   Equipment used: Rolling [place] (2 wheeled)   Sit to Stand: Min assist;From elevated surface Stand pivot transfers: Min assist       General transfer comment: assist to rise, steady and control descent    Balance                                            ADL Overall ADL's : Needs assistance/impaired     Grooming: Brushing hair;Wash/dry hands;Supervision/safety;Sitting       Lower Body Bathing: Minimal assistance;Sit to/from stand       Lower Body Dressing: Minimal assistance;Sit to/from stand   Toilet Transfer: Minimal assistance;Regular Toilet;Stand-pivot;RW   Toileting- Water quality scientist and Hygiene: Total assistance;Sit to/from stand         General ADL Comments: performed spt to [facility]; combed hairf     Vision     Perception     Praxis      Pertinent Vitals/Pain Pain Assessment: No/denies pain     Hand Dominance      Extremity/Trunk Assessment Upper Extremity Assessment Upper Extremity Assessment: Overall WFL for tasks assessed           Communication Communication Communication: HOH   Cognition Arousal/Alertness: Awake/alert Behavior During Therapy: WFL for tasks assessed/performed Overall Cognitive Status: Impaired/Different from baseline Area of Impairment: Safety/judgement               General Comments: cues for safety; using wrong words at times.  Not oriented to place or situation  confusion per nursing--pulled lines out   General Comments       Exercises       Shoulder Instructions      Home Living Family/patient expects to be discharged to:: Private residence Living Arrangements: Alone Available Help at Discharge: Family;Available PRN/intermittently Type of Home: House             Bathroom Shower/Tub: Walk-in Psychologist, prison and probation services: Standard     Home Equipment: Environmental consultant - 2 wheels;Bedside commode;Shower seat - built in          Prior Functioning/Environment Level of Independence: Needs assistance        Comments: per PT note, assist for bathing; pt states she is independent        OT Problem List: Decreased strength;Decreased activity tolerance;Impaired balance (sitting  and/or standing);Decreased safety awareness;Decreased cognition   OT Treatment/Interventions: Self-care/ADL training;DME and/or AE instruction;Patient/family education;Balance training;Therapeutic activities    OT Goals(Current goals can be found in the care plan section) Acute Rehab OT Goals OT Goal Formulation: Patient unable to participate in goal setting Time For Goal Achievement: 03/07/16 Potential to Achieve Goals: Good ADL Goals Pt Will Perform Grooming: with min guard assist;standing Pt Will Perform Lower Body Bathing: with min guard assist;sit to/from stand Pt Will Perform Lower Body Dressing: with min guard assist;sit to/from stand Pt Will Transfer to Toilet: with min  guard assist;ambulating;bedside commode Pt Will Perform Toileting - Clothing Manipulation and hygiene: with min guard assist;sit to/from stand  OT Frequency: Min 2X/week   Barriers to D/C:            Co-evaluation              End of Session    Activity Tolerance: Patient tolerated treatment well Patient left: in bed;with call bell/phone within reach;with bed alarm set   Time: [id number] OT Time Calculation (min): 29 min Charges:  OT General Charges $OT Visit: 1 Procedure OT Evaluation $OT Eval Moderate Complexity: 1 Procedure OT Treatments $Self Care/Home Management : 8-22 mins G-Codes:    Atalie Oros 2016-03-25, 1:53 PM

## 2016-02-29 NOTE — Progress Notes (Signed)
CONCERNING: IV to Oral Route Change Policy  RECOMMENDATION: This patient is receiving famotidine by the intravenous route.  Based on criteria approved by the Pharmacy and Therapeutics Committee, the intravenous medication(s) is/are being converted to the equivalent oral dose form(s).   DESCRIPTION: These criteria include:  The patient is eating (either orally or via tube) and/or has been taking other orally administered medications for a least 24 hours  The patient has no evidence of active gastrointestinal bleeding or impaired GI absorption (gastrectomy, short bowel, patient on TNA or NPO).  If you have questions about this conversion, please contact the Pharmacy Department  []   [phone] )  [facility] []   [phone] )  [facility] []   [phone] )  [facility] []   [phone] )  [facility] []   [phone] )  [place] 02/29/2016, 10:05 AM Pager [phone]

## 2016-02-29 NOTE — Consult Note (Signed)
   [facility] CM Inpatient Consult   02/29/2016  VEARL HEDTKE 1926/03/19 [id number]    Patient screened for potential [facility] Care Management services. Chart reviewed. Noted discharge plan is for SNF.  There are no identifiable [facility] Care Management needs at this time. If patient's post hospital needs change, please place a [facility] Care Management consult. For questions please contact:  Marthenia Rolling, [place], RN,BSN [facility] Liaison [phone]

## 2016-02-29 NOTE — NC FL2 (Signed)
[place] LEVEL OF CARE SCREENING TOOL     IDENTIFICATION  Patient Name: Virginia Price Birthdate: 19-Nov-1925 Sex: female Admission Date (Current Location): 02/26/2016  [facility] and [place] Number:  Herbalist and Address:  [facility],  [place] [address], [place]      Provider Number: [id number]  Attending Physician Name and Address:  Theodis Blaze, MD  Relative Name and Phone Number:       Current Level of Care: Hospital Recommended Level of Care: [place] Prior Approval Number:    Date Approved/Denied:   PASRR Number:   [id number]  Discharge Plan: SNF    Current Diagnoses: Patient Active Problem List   Diagnosis Date Noted  . Acute lower UTI (urinary tract infection) 02/27/2016  . UTI (urinary tract infection) 02/26/2016  . Nausea vomiting and diarrhea 02/26/2016  . Confusion with non-focal neuro exam 02/26/2016  . UTI (lower urinary tract infection) 02/26/2016  . Gait disorder 09/18/2015  . Chronic anticoagulation 06/03/2015  . Persistent atrial fibrillation ([place]) 06/03/2015  . Orthostatic hypotension 06/03/2015  . Acute on chronic diastolic CHF (congestive heart failure), NYHA class 3 ([place]) 06/03/2015  . PCP NOTES >>>>> 04/20/2015  . PCP comments --Depression 10/15/2014  . DJD (degenerative joint disease) 06/14/2014  . Lung nodule 10/01/2013  . Aortic insufficiency 09/05/2013  . Aortic valve sclerosis 09/05/2013  . Shortness of breath 08/20/2013  . Encounter for therapeutic drug monitoring 08/06/2013  . Warfarin anticoagulation 02/04/2012  . Dyspepsia 11/03/2011  . Annual physical exam 09/10/2011  . Ejection fraction <   . Recurrent UTI 02/28/2011  . Mitral regurgitation   . Chronic atrial fibrillation ([place])   . Chronic diastolic CHF (congestive heart failure) ([place]) 12/12/2010  . Chest pain   . GERD (gastroesophageal reflux disease)   . Abnormal chest CT   . PCP comments--DIZZINESS  06/02/2010  . IBS 05/06/2009  . BACK PAIN 08/10/2008  . MALIGNANT MELANOMA, SKIN 11/15/2007  . CARCINOMA, BASAL CELL, HX OF 11/15/2007  . PERSONAL HX COLONIC POLYPS 11/15/2007  . OVERACTIVE BLADDER 06/13/2007  . Dementia 06/13/2007  . ALLERGIC RHINITIS 03/16/2007  . GERD 03/16/2007  . RESTLESS LEG SYNDROME 01/30/2007  . Hyperlipidemia 06/02/2006  . Essential hypertension 06/02/2006    Orientation RESPIRATION BLADDER Height & Weight     Self  O2 (4L) Incontinent Weight: 166 lb 10.7 oz (75.6 kg) Height:  5\' 7"  (170.2 cm)  BEHAVIORAL SYMPTOMS/MOOD NEUROLOGICAL BOWEL NUTRITION STATUS      Incontinent Diet (Soft diet)  AMBULATORY STATUS COMMUNICATION OF NEEDS Skin   Extensive Assist Verbally Normal                       Personal Care Assistance Level of Assistance  Bathing, Feeding, Dressing Bathing Assistance: Limited assistance Feeding assistance: Limited assistance Dressing Assistance: Limited assistance     Functional Limitations Info  Sight, Hearing, Speech Sight Info: Adequate Hearing Info: Impaired Speech Info: Adequate    SPECIAL CARE FACTORS FREQUENCY  PT (By licensed PT), OT (By licensed OT)     PT Frequency: 5 OT Frequency: 5            Contractures Contractures Info: Not present    Additional Factors Info  Code Status, Allergies, Isolation Precautions, Psychotropic (DNR) Code Status Info: DNR Allergies Info: Ciprofloxacin, Penicillins, Sulfamethoxazole-trimethoprim, Sulfonamide Derivatives, [name] Ethacrynic Acid, Hydrocodone-acetaminophen, Metronidazole, Prednisone, Tramadol Hcl Psychotropic Info: Lexapro   Isolation Precautions Info: on contact Enteric  Current Medications (02/29/2016):  This is the current hospital active medication list Current Facility-Administered Medications  Medication Dose Route Frequency Provider Last Rate Last Dose  . acetaminophen (TYLENOL) tablet 650 mg  650 mg Oral Q6H PRN Vianne Bulls, MD   650 mg at  02/28/16 0600   Or  . acetaminophen (TYLENOL) suppository 650 mg  650 mg Rectal Q6H PRN Vianne Bulls, MD      . cefTRIAXone (ROCEPHIN) 1 g in dextrose 5 % 50 mL IVPB  1 g Intravenous Q24H Vianne Bulls, MD   1 g at 02/28/16 2104  . diclofenac sodium (VOLTAREN) 1 % transdermal gel 1 application  1 application Topical QID Vianne Bulls, MD   1 application at [id number] 2121  . escitalopram (LEXAPRO) tablet 10 mg  10 mg Oral Daily Vianne Bulls, MD   10 mg at 02/28/16 0948  . famotidine (PEPCID) IVPB 20 mg premix  20 mg Intravenous Q12H Ilene Qua Opyd, MD   20 mg at 02/28/16 2246  . furosemide (LASIX) tablet 20 mg  20 mg Oral Daily Theodis Blaze, MD   20 mg at 02/28/16 2115  . HYDROcodone-acetaminophen (NORCO/VICODIN) 5-325 MG per tablet 1-2 tablet  1-2 tablet Oral Q4H PRN Vianne Bulls, MD   1 tablet at 02/28/16 1028  . ipratropium-albuterol (DUONEB) 0.5-2.5 (3) MG/3ML nebulizer solution 3 mL  3 mL Nebulization Q4H PRN Vianne Bulls, MD      . meclizine (ANTIVERT) tablet 25 mg  25 mg Oral BID PRN Vianne Bulls, MD      . MEDLINE mouth rinse  15 mL Mouth Rinse BID Vianne Bulls, MD   15 mL at 02/28/16 2200  . metoprolol tartrate (LOPRESSOR) tablet 25 mg  25 mg Oral BID Vianne Bulls, MD   25 mg at 02/28/16 2116  . ondansetron (ZOFRAN) tablet 4 mg  4 mg Oral Q6H PRN Vianne Bulls, MD       Or  . ondansetron (ZOFRAN) injection 4 mg  4 mg Intravenous Q6H PRN Vianne Bulls, MD      . simvastatin (ZOCOR) tablet 40 mg  40 mg Oral QHS Vianne Bulls, MD   40 mg at 02/28/16 2116  . sodium chloride flush (NS) 0.9 % injection 3 mL  3 mL Intravenous Q12H Vianne Bulls, MD   3 mL at 02/28/16 0949  . Warfarin - Pharmacist Dosing Inpatient   Does not apply q1800 Royetta Asal, [facility]         Discharge Medications: Please see discharge summary for a list of discharge medications.  Relevant Imaging Results:  Relevant Lab Results:   Additional Information SSn:  [id number]  Standley Brooking,  LCSW

## 2016-03-01 DIAGNOSIS — G9341 Metabolic encephalopathy: Secondary | ICD-10-CM | POA: Diagnosis not present

## 2016-03-01 DIAGNOSIS — R531 Weakness: Secondary | ICD-10-CM | POA: Diagnosis not present

## 2016-03-01 DIAGNOSIS — I10 Essential (primary) hypertension: Secondary | ICD-10-CM | POA: Diagnosis not present

## 2016-03-01 DIAGNOSIS — E876 Hypokalemia: Secondary | ICD-10-CM | POA: Diagnosis not present

## 2016-03-01 DIAGNOSIS — N390 Urinary tract infection, site not specified: Secondary | ICD-10-CM | POA: Diagnosis not present

## 2016-03-01 DIAGNOSIS — R5381 Other malaise: Secondary | ICD-10-CM | POA: Diagnosis not present

## 2016-03-01 DIAGNOSIS — A045 Campylobacter enteritis: Principal | ICD-10-CM

## 2016-03-01 DIAGNOSIS — R279 Unspecified lack of coordination: Secondary | ICD-10-CM | POA: Diagnosis not present

## 2016-03-01 DIAGNOSIS — M6281 Muscle weakness (generalized): Secondary | ICD-10-CM

## 2016-03-01 DIAGNOSIS — K219 Gastro-esophageal reflux disease without esophagitis: Secondary | ICD-10-CM

## 2016-03-01 DIAGNOSIS — I509 Heart failure, unspecified: Secondary | ICD-10-CM | POA: Diagnosis not present

## 2016-03-01 DIAGNOSIS — I5032 Chronic diastolic (congestive) heart failure: Secondary | ICD-10-CM | POA: Diagnosis not present

## 2016-03-01 DIAGNOSIS — R410 Disorientation, unspecified: Secondary | ICD-10-CM | POA: Diagnosis not present

## 2016-03-01 DIAGNOSIS — I4891 Unspecified atrial fibrillation: Secondary | ICD-10-CM | POA: Diagnosis not present

## 2016-03-01 DIAGNOSIS — R278 Other lack of coordination: Secondary | ICD-10-CM | POA: Diagnosis not present

## 2016-03-01 DIAGNOSIS — R2681 Unsteadiness on feet: Secondary | ICD-10-CM | POA: Diagnosis not present

## 2016-03-01 LAB — BASIC METABOLIC PANEL
Anion gap: 9 (ref 5–15)
BUN: 14 mg/dL (ref 6–20)
CALCIUM: 9 mg/dL (ref 8.9–10.3)
CHLORIDE: 103 mmol/L (ref 101–111)
CO2: 29 mmol/L (ref 22–32)
CREATININE: 0.67 mg/dL (ref 0.44–1.00)
GFR calc Af Amer: >60 mL/min (ref >60)
GFR calc non Af Amer: >60 mL/min (ref >60)
GLUCOSE: 91 mg/dL (ref 65–99)
Potassium: 3.6 mmol/L (ref 3.5–5.1)
Sodium: 141 mmol/L (ref 135–145)

## 2016-03-01 LAB — CBC
HEMATOCRIT: 35 % — AB (ref 36.0–46.0)
HEMOGLOBIN: 11.9 g/dL — AB (ref 12.0–15.0)
MCH: 28 pg (ref 26.0–34.0)
MCHC: 34 g/dL (ref 30.0–36.0)
MCV: 82.4 fL (ref 78.0–100.0)
Platelets: 244 10*3/uL (ref 150–400)
RBC: 4.25 MIL/uL (ref 3.87–5.11)
RDW: 14.4 % (ref 11.5–15.5)
WBC: 5.8 10*3/uL (ref 4.0–10.5)

## 2016-03-01 LAB — PROTIME-INR
INR: 3.21
Prothrombin Time: 33.5 seconds — ABNORMAL HIGH (ref 11.4–15.2)

## 2016-03-01 LAB — GLUCOSE, CAPILLARY: Glucose-Capillary: 92 mg/dL (ref 65–99)

## 2016-03-01 MED ORDER — NITROFURANTOIN MONOHYD MACRO 100 MG PO CAPS
100.0000 mg | ORAL_CAPSULE | Freq: Two times a day (BID) | ORAL | Status: AC
Start: 2016-03-01 — End: 2016-03-06

## 2016-03-01 MED ORDER — SACCHAROMYCES BOULARDII 250 MG PO CAPS: 250.0000 mg | ORAL_CAPSULE | Freq: Two times a day (BID) | ORAL | Status: AC

## 2016-03-01 MED ORDER — AZITHROMYCIN 250 MG PO TABS: ORAL_TABLET | ORAL | Status: AC

## 2016-03-01 MED ORDER — AZITHROMYCIN 250 MG PO TABS
500.0000 mg | ORAL_TABLET | Freq: Every day | ORAL | Status: DC
  Administered 2016-03-01: 500 mg via ORAL
  Filled 2016-03-01: qty 2

## 2016-03-01 MED ORDER — VITAMINS A & D EX OINT
TOPICAL_OINTMENT | CUTANEOUS | Status: AC
  Administered 2016-03-01: 5
  Filled 2016-03-01: qty 5

## 2016-03-01 MED ORDER — FAMOTIDINE 20 MG PO TABS: 20.0000 mg | ORAL_TABLET | Freq: Two times a day (BID) | ORAL | Status: AC

## 2016-03-01 NOTE — Clinical Social Work Placement (Signed)
   CLINICAL SOCIAL WORK PLACEMENT  NOTE  Date:  03/01/2016  Patient Details  Name: Virginia Price MRN: [id number] Date of Birth: Feb 12, 1926  Clinical Social Work is seeking post-discharge placement for this patient at the [place] level of care (*CSW will initial, date and re-position this form in  chart as items are completed):  Yes   Patient/family provided with [place] Work Department's list of facilities offering this level of care within the geographic area requested by the patient (or if unable, by the patient's family).  Yes   Patient/family informed of their freedom to choose among providers that offer the needed level of care, that participate in Medicare, Medicaid or managed care program needed by the patient, have an available bed and are willing to accept the patient.  Yes   Patient/family informed of [facility]'s ownership interest in [facility] and [facility], as well as of the fact that they are under no obligation to receive care at these facilities.  PASRR submitted to EDS on 02/28/16     PASRR number received on 02/28/16     Existing PASRR number confirmed on       FL2 transmitted to all facilities in geographic area requested by pt/family on 02/28/16     FL2 transmitted to all facilities within larger geographic area on       Patient informed that his/her managed care company has contracts with or will negotiate with certain facilities, including the following:        Yes   Patient/family informed of bed offers received.  Patient chooses bed at [facility], [place]     Physician recommends and patient chooses bed at      Patient to be transferred to [facility] on 03/01/16.  Patient to be transferred to facility by [facility]     Patient family notified on 03/01/16 of transfer.  Name of family member notified:  DAUGHTER     PHYSICIAN       Additional Comment: Pt / daughter are in agreement with  d/c to [facility] ( [facility] ) today. [facility] transport required. Medical necessity form completed. D/C Summary sent to SNF for review. No scripts printed. # for report provided to nsg.   _______________________________________________ Luretha Rued, LCSW 03/01/2016, 4:20 PM

## 2016-03-01 NOTE — Discharge Summary (Signed)
Physician Discharge Summary  Virginia Price [id number] DOB: 08-Jun-1925 DOA: 02/26/2016  PCP: Kathlene November, MD  Admit date: 02/26/2016 Discharge date: 03/01/2016  Time spent: 35 minutes  Recommendations for Outpatient Follow-up:  Maintain adequate hydration  Take medications as prescribed Follow up INR closely as patient will be on antibiotics  Repeat BMET to follow renal function and electrolytes  Follow up with PCP in 10 days for hospital follow up   Discharge Diagnoses:  Principal Problem:   Recurrent UTI Active Problems:   Essential hypertension   GERD   Chronic diastolic CHF (congestive heart failure) (HCC)   Persistent atrial fibrillation (HCC)   Nausea vomiting and diarrhea   Confusion with non-focal neuro exam   UTI (lower urinary tract infection)   Acute lower UTI (urinary tract infection)   Campylobacter gastrointestinal tract infection   Muscle weakness (generalized)   Discharge Condition: stable and improved. Will discharge to SNF for rehabilitation and further care.  Diet recommendation: heart healthy diet   Filed Weights   02/28/16 0700 02/29/16 0500 03/01/16 0551  Weight: 74.7 kg (164 lb 11.2 oz) 75.6 kg (166 lb 10.7 oz) 74.1 kg (163 lb 5.8 oz)    History of present illness:  As per Dr. Myna Hidalgo H&P written on 02/26/16 82 y.o. female with medical history significant for atrial fibrillation on Coumadin, chronic diastolic CHF, hypertension, GERD, and memory loss who presents to the emergency department with 1 day of chills, nausea, vomiting, and diarrhea. Patient reports that she was in her usual state until yesterday when she developed a mild generalized abdominal pain, described as crampy and constant. There was associated nausea, nonbloody nonbilious vomiting, and watery diarrhea. Her symptoms have continued to worsen today, prompting a presentation here. Her family also raises concern for confusion, noting that the patient has been hearing voices and the home and  has been intermittently disoriented. The confusion and hallucinations have actually been going on for at least a couple of weeks and the patient was evaluated by her PCP for this earlier in the month, I was attributed to her progressing dementia, and she was referred for home health services. Patient endorses chills, but has not taken her temperature. She denies any recent long distance travel or sick contacts. She has had recurrent UTIs and takes daily Keflex as prophylaxis.  Hospital Course:  1. Nausea, vomiting, watery diarrhea  - suspect due to acute GI infection, stool panel positive for Campylobacter  - appears to be better and improving -will discharge on zithromax to complete 3 days of treatment -also started on probiotics and pepcid to help with symptoms management  - better oral intake and no nausea or vomiting  -advise to maintain adequate hydration.  2. Recurrent UTI, E. Coli  - Pt takes daily Keflex as suppressive therapy -following sensitivities will discharge on macrobid -advise to keep herself well hydrated  3. Chronic atrial fibrillation  - CHADS-VASc at least 43 (age x2, gender, CHF, HTN, hx CVA) - Check INR closely as mildly supratherapeutic at discharge -will hold coumadin for 10/4 and resume on 10/5 -given use of abx's needs close follow up of her INR -continue lopressor for rate control   4. Chronic diastolic CHF :  - TTE ([id number]) with EF 65-70%, mild MR and LAE, moderate TR  - Follow daily weights  - will resume diuretics and patient has been advise to follow low sodium diet   5. Acute metabolic encephalopathy secondary to UTI, bacterial gastroenteritis; potential contribution from hospital delirium  -  in pt with known dementia, may be also progressive dementia  - Head CT with no acute intracranial abnormalities - alert and able to follow commands -patient oriented to name and place only  6. Hypertension, essential  -stable and rising -will continue  home medication regimen   7. GERD - Erosive esophagitis on remote EGD (2003)  - Continue Pepcid BID  8. Hypokalemia - repleted and WNL at discharge -BMET to follow electrolytes at follow up visit   9. Physical deconditioning  -will discharge to SNF as per PT recommendations for rehabilitation.  Procedures:  See below for x-ray reports   Consultations:  None   Discharge Exam: Vitals:   03/01/16 1227 03/01/16 1300  BP: (!) 94/59 118/70  Pulse: 82 79  Resp:  18  Temp:  98.1 F (36.7 C)   General exam: Appears stable and in no distress. Patient oriented to person and place. Denies SOB, CP, nausea and vomiting. Per staff no further diarrhea reported.   Respiratory system: diminished breath sounds at bases, no wheezing, no frank crackles.  Cardiovascular system: S1 & S2 heard, RRR. No JVD, rubs, gallops or clicks. No pedal edema. Positive SEM Gastrointestinal system: Abdomen is nondistended, soft, mild diffuse tenderness on deep palpation. Positive BS.  Central nervous system: Alert and oriented to name and place, strength equal bilaterally in upper extremities and on dorsiflexion/plantarflexion bilaterally, unable to bend legs at the knees (says it is too painful), CN 2-12 grossly intact   Discharge Instructions   Discharge Instructions    Diet - low sodium heart healthy    Complete by:  As directed    Discharge instructions    Complete by:  As directed    Maintain adequate hydration  Take medications as prescribed Follow up INR closely as patient will be on antibiotics  Repeat BMET to follow renal function and electrolytes  Follow up with PCP in 10 days for hospital follow up     Current Discharge Medication List    START taking these medications   Details  azithromycin (ZITHROMAX) 250 MG tablet Take 2 tablets by mouth daily for 2 more days.    famotidine (PEPCID) 20 MG tablet Take 1 tablet (20 mg total) by mouth 2 (two) times daily.    nitrofurantoin,  macrocrystal-monohydrate, (MACROBID) 100 MG capsule Take 1 capsule (100 mg total) by mouth every 12 (twelve) hours.    saccharomyces boulardii (FLORASTOR) 250 MG capsule Take 1 capsule (250 mg total) by mouth 2 (two) times daily.      CONTINUE these medications which have NOT CHANGED   Details  dicyclomine (BENTYL) 10 MG capsule Take 1 capsule (10 mg total) by mouth 3 (three) times daily before meals. Qty: 90 capsule, Refills: 11    escitalopram (LEXAPRO) 10 MG tablet Take 1 tablet (10 mg total) by mouth daily. Qty: 90 tablet, Refills: 1    furosemide (LASIX) 20 MG tablet Take 1 tablet (20 mg total) by mouth daily. Qty: 90 tablet, Refills: 6    hydrocortisone 2.5 % cream Apply topically 2 (two) times daily. Qty: 30 g, Refills: 0    meclizine (ANTIVERT) 25 MG tablet Take 1 tablet (25 mg total) by mouth 2 (two) times daily as needed for dizziness. Qty: 60 tablet, Refills: 2   Associated Diagnoses: Encounter for immunization    metoprolol (LOPRESSOR) 50 MG tablet Take 0.5 tablets (25 mg total) by mouth 2 (two) times daily. Qty: 90 tablet, Refills: 1    simvastatin (ZOCOR) 40 MG tablet  Take 1 tablet (40 mg total) by mouth at bedtime. Qty: 90 tablet, Refills: 1    warfarin (COUMADIN) 2 MG tablet Take as directed by [facility]. Qty: 90 tablet, Refills: 0    albuterol (PROVENTIL HFA;VENTOLIN HFA) 108 (90 BASE) MCG/ACT inhaler Inhale 2 puffs into the lungs every 6 (six) hours as needed for wheezing or shortness of breath. Qty: 1 Inhaler, Refills: 2    ezetimibe (ZETIA) 10 MG tablet Take 1 tablet (10 mg total) by mouth daily. Qty: 30 tablet, Refills: 5    fluticasone (FLONASE) 50 MCG/ACT nasal spray Place 2 sprays into the nose as needed for allergies or rhinitis.     nitroGLYCERIN (NITROLINGUAL) 0.4 MG/SPRAY spray Place 1 spray under the tongue every 5 (five) minutes x 3 doses as needed. Reported on 09/17/2015    nystatin ointment (MYCOSTATIN) Apply 1 application topically 2  (two) times daily. Qty: 30 g, Refills: 0    VOLTAREN 1 % GEL Apply 1 application topically 4 (four) times daily.       STOP taking these medications     cephALEXin (KEFLEX) 250 MG capsule        Allergies  Allergen Reactions  . Ciprofloxacin Hives  . Penicillins Rash  . Sulfamethoxazole-Trimethoprim Rash  . Sulfonamide Derivatives Rash  . [name] [Ethacrynic Acid] Other (See Comments)    UNKNOWN REACTION  . Hydrocodone-Acetaminophen Nausea Only  . Metronidazole Other (See Comments)    UNKNOWN REACTION  . Prednisone Other (See Comments)    UNKNOWN REACTION  . Tramadol Hcl Other (See Comments)    [place], MD. Schedule an appointment as soon as possible for a visit in 10 day(s).   Specialty:  Internal Medicine Contact information: [address] [place] [address] [phone]           The results of significant diagnostics from this hospitalization (including imaging, microbiology, ancillary and laboratory) are listed below for reference.    Significant Diagnostic Studies: Dg Chest 2 View  Result Date: 02/26/2016 CLINICAL DATA:  Weakness. EXAM: CHEST  2 VIEW COMPARISON:  Radiographs of March 22, 2015. FINDINGS: Stable cardiomediastinal silhouette. No pneumothorax is noted. Minimal bilateral pleural effusions are noted. Mild bibasilar subsegmental atelectasis is noted which is improved compared to prior exam. Atherosclerosis of thoracic aorta is noted. Bony thorax is unremarkable. IMPRESSION: Probable mild bibasilar subsegmental atelectasis with minimal pleural effusions. Electronically Signed   By: Marijo Conception, M.D.   On: 02/26/2016 18:16   Ct Head Wo Contrast  Result Date: 02/26/2016 CLINICAL DATA:  Altered mental status. EXAM: CT HEAD WITHOUT CONTRAST TECHNIQUE: Contiguous axial images were obtained from the base of the skull through the vertex without intravenous contrast. COMPARISON:  Brain MRI 06/16/2010  FINDINGS: Brain: Remote infarction in the LEFT temporal lobe. No acute intracranial hemorrhage. No focal mass lesion. No CT evidence of acute infarction. No midline shift or mass effect. No hydrocephalus. Basilar cisterns are patent. There are periventricular and subcortical white matter hypodensities. Generalized cortical atrophy. Vascular: No hyperdense vessel or unexpected calcification. Skull: Normal. Negative for fracture or focal lesion. Sinuses/Orbits: Paranasal sinuses and mastoid air cells are clear. Orbits are clear. Other: None. IMPRESSION: 1. No acute intracranial findings. 2. Remote LEFT temporal infarction. 3. Chronic atrophy and white matter microvascular disease. Electronically Signed   By: Suzy Bouchard M.D.   On: 02/26/2016 20:29    Microbiology: Recent Results (from the past 240 hour(s))  Urine culture     Status: Abnormal   Collection Time: 02/26/16  6:30 PM  Result Value Ref Range Status   Specimen Description URINE, CLEAN CATCH  Final   Special Requests NONE  Final   Culture 60,000 COLONIES/mL ESCHERICHIA COLI (A)  Final   Report Status 02/29/2016 FINAL  Final   Organism ID, Bacteria ESCHERICHIA COLI (A)  Final      Susceptibility   Escherichia coli - MIC*    AMPICILLIN >=32 RESISTANT Resistant     CEFAZOLIN >=64 RESISTANT Resistant     CEFTRIAXONE >=64 RESISTANT Resistant     CIPROFLOXACIN <=0.25 SENSITIVE Sensitive     GENTAMICIN <=1 SENSITIVE Sensitive     IMIPENEM <=0.25 SENSITIVE Sensitive     NITROFURANTOIN <=16 SENSITIVE Sensitive     TRIMETH/SULFA <=20 SENSITIVE Sensitive     AMPICILLIN/SULBACTAM >=32 RESISTANT Resistant     PIP/TAZO >=128 RESISTANT Resistant     Extended ESBL NEGATIVE Sensitive     * 60,000 COLONIES/mL ESCHERICHIA COLI  Blood culture (routine x 2)     Status: None (Preliminary result)   Collection Time: 02/26/16  7:22 PM  Result Value Ref Range Status   Specimen Description BLOOD LEFT HAND  Final   Special Requests IN PEDIATRIC BOTTLE  2CC  Final   Culture   Final    NO GROWTH 2 DAYS Performed at [facility]    Report Status PENDING  Incomplete  Blood culture (routine x 2)     Status: None (Preliminary result)   Collection Time: 02/26/16  7:22 PM  Result Value Ref Range Status   Specimen Description BLOOD RIGHT ARM  Final   Special Requests BOTTLES DRAWN AEROBIC AND ANAEROBIC 5CC  Final   Culture   Final    NO GROWTH 2 DAYS Performed at [facility]    Report Status PENDING  Incomplete  Gastrointestinal Panel by PCR , Stool     Status: Abnormal   Collection Time: 02/27/16  3:21 AM  Result Value Ref Range Status   Campylobacter species DETECTED (A) NOT DETECTED Final    Comment: CRITICAL RESULT CALLED TO, READ BACK BY AND VERIFIED WITH: NICOLE MCCOY MLT 02/28/16 0845 SGD CRITICAL RESULT CALLED TO, READ BACK BY AND VERIFIED WITH: GARMEN,P. RN @0923  ON 10.2.17 BY MCCOY,N.    Plesimonas shigelloides NOT DETECTED NOT DETECTED Final   Salmonella species NOT DETECTED NOT DETECTED Final   Yersinia enterocolitica NOT DETECTED NOT DETECTED Final   Vibrio species NOT DETECTED NOT DETECTED Final   Vibrio cholerae NOT DETECTED NOT DETECTED Final   Enteroaggregative E coli (EAEC) NOT DETECTED NOT DETECTED Final   Enteropathogenic E coli (EPEC) NOT DETECTED NOT DETECTED Final   Enterotoxigenic E coli (ETEC) NOT DETECTED NOT DETECTED Final   Shiga like toxin producing E coli (STEC) NOT DETECTED NOT DETECTED Final   Shigella/Enteroinvasive E coli (EIEC) NOT DETECTED NOT DETECTED Final   Cryptosporidium NOT DETECTED NOT DETECTED Final   Cyclospora cayetanensis NOT DETECTED NOT DETECTED Final   Entamoeba histolytica NOT DETECTED NOT DETECTED Final   Giardia lamblia NOT DETECTED NOT DETECTED Final   Adenovirus F40/41 NOT DETECTED NOT DETECTED Final   Astrovirus NOT DETECTED NOT DETECTED Final   Norovirus GI/GII NOT DETECTED NOT DETECTED Final   Rotavirus A NOT DETECTED NOT DETECTED Final   Sapovirus (I, II, IV,  and V) NOT DETECTED NOT DETECTED Final  C difficile quick scan w PCR reflex     Status: None  Collection Time: 02/27/16  3:21 AM  Result Value Ref Range Status   C Diff antigen NEGATIVE NEGATIVE Final   C Diff toxin NEGATIVE NEGATIVE Final   C Diff interpretation No C. difficile detected.  Final     Labs: Basic Metabolic Panel:  Recent Labs Lab 02/26/16 1538 02/27/16 0130 02/28/16 0403 02/29/16 0520 03/01/16 0544  NA 136 135 138 138 141  K 3.6 3.6 3.6 2.8* 3.6  CL 105 108 104 104 103  CO2 24 22 23 26 29   GLUCOSE 125* 125* 160* 100* 91  BUN 13 13 13 15 14   CREATININE 0.84 0.80 0.92 0.80 0.67  CALCIUM 8.7* 8.2* 8.6* 8.3* 9.0  MG  --  1.7  --   --   --    Liver Function Tests:  Recent Labs Lab 02/26/16 1538 02/27/16 0130  AST 17 14*  ALT 11* 11*  ALKPHOS 71 63  BILITOT 1.9* 2.0*  PROT 6.9 6.6  ALBUMIN 3.9 3.8    Recent Labs Lab 02/26/16 1538  LIPASE 18   CBC:  Recent Labs Lab 02/26/16 1538 02/27/16 0130 02/28/16 0403 02/29/16 0520 03/01/16 0544  WBC 10.6* 10.1 12.5* 6.9 5.8  NEUTROABS  --  8.8*  --   --   --   HGB 13.1 11.9* 12.2 12.2 11.9*  HCT 38.9 35.9* 36.9 36.5 35.0*  MCV 85.7 85.7 85.8 84.9 82.4  PLT 211 222 201 223 244   BNP (last 3 results)  Recent Labs  02/26/16 1538  BNP 257.9*   CBG:  Recent Labs Lab 02/27/16 0816 02/28/16 0757 02/29/16 0728 03/01/16 0829  GLUCAP 97 121* 93 92    Signed:  Barton Dubois MD.  [facility] 03/01/2016, 2:54 PM

## 2016-03-01 NOTE — Progress Notes (Signed)
ANTICOAGULATION CONSULT NOTE - Follow up [facility] for warfarin Indication: atrial fibrillation  Allergies  Allergen Reactions  . Ciprofloxacin Hives  . Penicillins Rash  . Sulfamethoxazole-Trimethoprim Rash  . Sulfonamide Derivatives Rash  . [name] [Ethacrynic Acid] Other (See Comments)    UNKNOWN REACTION  . Hydrocodone-Acetaminophen Nausea Only  . Metronidazole Other (See Comments)    UNKNOWN REACTION  . Prednisone Other (See Comments)    UNKNOWN REACTION  . Tramadol Hcl Other (See Comments)    Sleepy    Patient Measurements: Height: 5\' 7"  (170.2 cm) Weight: 163 lb 5.8 oz (74.1 kg) IBW/kg (Calculated) : 61.6  Vital Signs: Temp: 98 F (36.7 C) (10/04 0551) Temp Source: Oral (10/04 0551) BP: 113/69 (10/04 0551) Pulse Rate: 83 (10/04 0551)  Labs:  Recent Labs  02/28/16 0403 02/29/16 0520 03/01/16 0544  HGB 12.2 12.2 11.9*  HCT 36.9 36.5 35.0*  PLT 201 223 244  LABPROT 27.4* 27.9* 33.5*  INR 2.49 2.55 3.21  CREATININE 0.92 0.80 0.67    Estimated Creatinine Clearance: 52.1 mL/min (by C-G formula based on SCr of 0.67 mg/dL).   Medications:  Scheduled:  . azithromycin  500 mg Oral Daily  . diclofenac sodium  1 application Topical QID  . escitalopram  10 mg Oral Daily  . famotidine  20 mg Oral BID  . furosemide  20 mg Oral Daily  . mouth rinse  15 mL Mouth Rinse BID  . metoprolol  25 mg Oral BID  . nitrofurantoin (macrocrystal-monohydrate)  100 mg Oral Q12H  . simvastatin  40 mg Oral QHS  . sodium chloride flush  3 mL Intravenous Q12H  . Warfarin - Pharmacist Dosing Inpatient   Does not apply q1800    Assessment: 52 yoF admitted on 9/30 for N/V/D and confusion, UTI.  PMH includes chronic warfarin for afib.  Pharmacy is consulted to resume warfarin dosing. PTA warfarin 2 mg PO daily except 4 mg PO on Fridays.  Admission INR 1.3.  Today,03/01/16  INR 3.21, supra-therapeutic  CBC and plt WNL  No bleeding or complications  reported  Diet: soft  No major drug-drug interactions.  Broad spectrum antibiotics may increase INR.   Goal of Therapy:  INR 2-3 Monitor platelets by anticoagulation protocol: Yes   Plan:   Hold warfarin today, INR supratherapeutic and azithromycin has been initiated which can increase bleeding risks  Daily PT/INR.  Monitor for signs and symptoms of bleeding.   Dolly Rias RPh 03/01/2016, 11:53 AM Pager [phone]

## 2016-03-01 NOTE — Clinical Social Work Placement (Signed)
Patient has a bed at [facility]. CSW has completed FL2 & will continue to follow and assist with discharge when ready.    Raynaldo Opitz, [facility] Clinical Social Worker cell #: [phone]     CLINICAL SOCIAL WORK PLACEMENT  NOTE  Date:  03/01/2016  Patient Details  Name: Virginia Price MRN: [id number] Date of Birth: Jan 20, 1926  Clinical Social Work is seeking post-discharge placement for this patient at the [place] level of care (*CSW will initial, date and re-position this form in  chart as items are completed):  Yes   Patient/family provided with [facility] Work Department's list of facilities offering this level of care within the geographic area requested by the patient (or if unable, by the patient's family).  Yes   Patient/family informed of their freedom to choose among providers that offer the needed level of care, that participate in Medicare, Medicaid or managed care program needed by the patient, have an available bed and are willing to accept the patient.  Yes   Patient/family informed of [facility]'s ownership interest in [facility] and [facility], as well as of the fact that they are under no obligation to receive care at these facilities.  PASRR submitted to [employer] on 02/28/16     PASRR number received on 02/28/16     Existing PASRR number confirmed on       FL2 transmitted to all facilities in geographic area requested by pt/family on 02/28/16     FL2 transmitted to all facilities within larger geographic area on       Patient informed that his/her managed care company has contracts with or will negotiate with certain facilities, including the following:        Yes   Patient/family informed of bed offers received.  Patient chooses bed at [place], [place]     Physician recommends and patient chooses bed at      Patient to be transferred to [place], [place] on   .  Patient to be transferred to facility by       Patient family notified on   of transfer.  Name of family member notified:        PHYSICIAN       Additional Comment:    _______________________________________________ Standley Brooking, LCSW 03/01/2016, 9:32 AM

## 2016-03-03 DIAGNOSIS — A045 Campylobacter enteritis: Secondary | ICD-10-CM | POA: Diagnosis not present

## 2016-03-03 DIAGNOSIS — N390 Urinary tract infection, site not specified: Secondary | ICD-10-CM | POA: Diagnosis not present

## 2016-03-03 DIAGNOSIS — I509 Heart failure, unspecified: Secondary | ICD-10-CM | POA: Diagnosis not present

## 2016-03-03 DIAGNOSIS — I4891 Unspecified atrial fibrillation: Secondary | ICD-10-CM | POA: Diagnosis not present

## 2016-03-03 DIAGNOSIS — I10 Essential (primary) hypertension: Secondary | ICD-10-CM | POA: Diagnosis not present

## 2016-03-03 LAB — CULTURE, BLOOD (ROUTINE X 2)
CULTURE: NO GROWTH
CULTURE: NO GROWTH

## 2016-03-10 ENCOUNTER — Ambulatory Visit: Payer: Medicare Other

## 2016-03-15 DIAGNOSIS — N390 Urinary tract infection, site not specified: Secondary | ICD-10-CM | POA: Diagnosis not present

## 2016-03-15 DIAGNOSIS — A045 Campylobacter enteritis: Secondary | ICD-10-CM | POA: Diagnosis not present

## 2016-03-15 DIAGNOSIS — I509 Heart failure, unspecified: Secondary | ICD-10-CM | POA: Diagnosis not present

## 2016-03-15 DIAGNOSIS — I4891 Unspecified atrial fibrillation: Secondary | ICD-10-CM | POA: Diagnosis not present

## 2016-03-15 DIAGNOSIS — I10 Essential (primary) hypertension: Secondary | ICD-10-CM | POA: Diagnosis not present

## 2016-03-20 DIAGNOSIS — K219 Gastro-esophageal reflux disease without esophagitis: Secondary | ICD-10-CM | POA: Diagnosis not present

## 2016-03-20 DIAGNOSIS — I5032 Chronic diastolic (congestive) heart failure: Secondary | ICD-10-CM | POA: Diagnosis not present

## 2016-03-20 DIAGNOSIS — I482 Chronic atrial fibrillation: Secondary | ICD-10-CM | POA: Diagnosis not present

## 2016-03-20 DIAGNOSIS — I10 Essential (primary) hypertension: Secondary | ICD-10-CM | POA: Diagnosis not present

## 2016-03-22 DIAGNOSIS — Z7901 Long term (current) use of anticoagulants: Secondary | ICD-10-CM | POA: Diagnosis not present

## 2016-03-23 DIAGNOSIS — R260 Ataxic gait: Secondary | ICD-10-CM | POA: Diagnosis not present

## 2016-03-23 DIAGNOSIS — I10 Essential (primary) hypertension: Secondary | ICD-10-CM | POA: Diagnosis not present

## 2016-03-28 DIAGNOSIS — R260 Ataxic gait: Secondary | ICD-10-CM | POA: Diagnosis not present

## 2016-03-28 DIAGNOSIS — I10 Essential (primary) hypertension: Secondary | ICD-10-CM | POA: Diagnosis not present

## 2016-03-29 DIAGNOSIS — Z79899 Other long term (current) drug therapy: Secondary | ICD-10-CM | POA: Diagnosis not present

## 2016-03-29 DIAGNOSIS — E038 Other specified hypothyroidism: Secondary | ICD-10-CM | POA: Diagnosis not present

## 2016-03-29 DIAGNOSIS — Z7901 Long term (current) use of anticoagulants: Secondary | ICD-10-CM | POA: Diagnosis not present

## 2016-03-29 DIAGNOSIS — D518 Other vitamin B12 deficiency anemias: Secondary | ICD-10-CM | POA: Diagnosis not present

## 2016-03-29 DIAGNOSIS — E559 Vitamin D deficiency, unspecified: Secondary | ICD-10-CM | POA: Diagnosis not present

## 2016-03-29 DIAGNOSIS — E782 Mixed hyperlipidemia: Secondary | ICD-10-CM | POA: Diagnosis not present

## 2016-03-29 DIAGNOSIS — E119 Type 2 diabetes mellitus without complications: Secondary | ICD-10-CM | POA: Diagnosis not present

## 2016-03-30 DIAGNOSIS — Z87440 Personal history of urinary (tract) infections: Secondary | ICD-10-CM | POA: Diagnosis not present

## 2016-03-30 DIAGNOSIS — I110 Hypertensive heart disease with heart failure: Secondary | ICD-10-CM | POA: Diagnosis not present

## 2016-03-30 DIAGNOSIS — Z792 Long term (current) use of antibiotics: Secondary | ICD-10-CM | POA: Diagnosis not present

## 2016-03-30 DIAGNOSIS — Z7901 Long term (current) use of anticoagulants: Secondary | ICD-10-CM | POA: Diagnosis not present

## 2016-03-30 DIAGNOSIS — I481 Persistent atrial fibrillation: Secondary | ICD-10-CM | POA: Diagnosis not present

## 2016-03-30 DIAGNOSIS — R260 Ataxic gait: Secondary | ICD-10-CM | POA: Diagnosis not present

## 2016-03-30 DIAGNOSIS — I5032 Chronic diastolic (congestive) heart failure: Secondary | ICD-10-CM | POA: Diagnosis not present

## 2016-03-30 DIAGNOSIS — M6281 Muscle weakness (generalized): Secondary | ICD-10-CM | POA: Diagnosis not present

## 2016-03-30 DIAGNOSIS — Z9181 History of falling: Secondary | ICD-10-CM | POA: Diagnosis not present

## 2016-03-30 DIAGNOSIS — R911 Solitary pulmonary nodule: Secondary | ICD-10-CM | POA: Diagnosis not present

## 2016-04-01 ENCOUNTER — Other Ambulatory Visit: Payer: Self-pay | Admitting: Internal Medicine

## 2016-04-03 ENCOUNTER — Other Ambulatory Visit: Payer: Self-pay | Admitting: General Practice

## 2016-04-03 MED ORDER — WARFARIN SODIUM 2 MG PO TABS: ORAL_TABLET | ORAL | 0 refills | Status: AC

## 2016-04-03 NOTE — Telephone Encounter (Signed)
Pt overdue for INR check?

## 2016-04-06 DIAGNOSIS — Z7901 Long term (current) use of anticoagulants: Secondary | ICD-10-CM | POA: Diagnosis not present

## 2016-04-07 DIAGNOSIS — I481 Persistent atrial fibrillation: Secondary | ICD-10-CM | POA: Diagnosis not present

## 2016-04-07 DIAGNOSIS — Z7901 Long term (current) use of anticoagulants: Secondary | ICD-10-CM | POA: Diagnosis not present

## 2016-04-07 DIAGNOSIS — M6281 Muscle weakness (generalized): Secondary | ICD-10-CM | POA: Diagnosis not present

## 2016-04-07 DIAGNOSIS — I5032 Chronic diastolic (congestive) heart failure: Secondary | ICD-10-CM | POA: Diagnosis not present

## 2016-04-07 DIAGNOSIS — Z87440 Personal history of urinary (tract) infections: Secondary | ICD-10-CM | POA: Diagnosis not present

## 2016-04-07 DIAGNOSIS — Z792 Long term (current) use of antibiotics: Secondary | ICD-10-CM | POA: Diagnosis not present

## 2016-04-07 DIAGNOSIS — Z9181 History of falling: Secondary | ICD-10-CM | POA: Diagnosis not present

## 2016-04-07 DIAGNOSIS — R260 Ataxic gait: Secondary | ICD-10-CM | POA: Diagnosis not present

## 2016-04-07 DIAGNOSIS — R911 Solitary pulmonary nodule: Secondary | ICD-10-CM | POA: Diagnosis not present

## 2016-04-07 DIAGNOSIS — I110 Hypertensive heart disease with heart failure: Secondary | ICD-10-CM | POA: Diagnosis not present

## 2016-04-11 DIAGNOSIS — R911 Solitary pulmonary nodule: Secondary | ICD-10-CM | POA: Diagnosis not present

## 2016-04-11 DIAGNOSIS — Z792 Long term (current) use of antibiotics: Secondary | ICD-10-CM | POA: Diagnosis not present

## 2016-04-11 DIAGNOSIS — I5032 Chronic diastolic (congestive) heart failure: Secondary | ICD-10-CM | POA: Diagnosis not present

## 2016-04-11 DIAGNOSIS — I481 Persistent atrial fibrillation: Secondary | ICD-10-CM | POA: Diagnosis not present

## 2016-04-11 DIAGNOSIS — Z9181 History of falling: Secondary | ICD-10-CM | POA: Diagnosis not present

## 2016-04-11 DIAGNOSIS — I110 Hypertensive heart disease with heart failure: Secondary | ICD-10-CM | POA: Diagnosis not present

## 2016-04-11 DIAGNOSIS — Z7901 Long term (current) use of anticoagulants: Secondary | ICD-10-CM | POA: Diagnosis not present

## 2016-04-11 DIAGNOSIS — R260 Ataxic gait: Secondary | ICD-10-CM | POA: Diagnosis not present

## 2016-04-11 DIAGNOSIS — M6281 Muscle weakness (generalized): Secondary | ICD-10-CM | POA: Diagnosis not present

## 2016-04-11 DIAGNOSIS — Z87440 Personal history of urinary (tract) infections: Secondary | ICD-10-CM | POA: Diagnosis not present

## 2016-04-12 DIAGNOSIS — M6281 Muscle weakness (generalized): Secondary | ICD-10-CM | POA: Diagnosis not present

## 2016-04-12 DIAGNOSIS — I110 Hypertensive heart disease with heart failure: Secondary | ICD-10-CM | POA: Diagnosis not present

## 2016-04-12 DIAGNOSIS — Z7901 Long term (current) use of anticoagulants: Secondary | ICD-10-CM | POA: Diagnosis not present

## 2016-04-12 DIAGNOSIS — Z87440 Personal history of urinary (tract) infections: Secondary | ICD-10-CM | POA: Diagnosis not present

## 2016-04-12 DIAGNOSIS — R260 Ataxic gait: Secondary | ICD-10-CM | POA: Diagnosis not present

## 2016-04-14 ENCOUNTER — Ambulatory Visit: Payer: Medicare Other | Admitting: Cardiology

## 2016-04-17 DIAGNOSIS — I482 Chronic atrial fibrillation: Secondary | ICD-10-CM | POA: Diagnosis not present

## 2016-04-17 DIAGNOSIS — I5032 Chronic diastolic (congestive) heart failure: Secondary | ICD-10-CM | POA: Diagnosis not present

## 2016-04-17 DIAGNOSIS — E784 Other hyperlipidemia: Secondary | ICD-10-CM | POA: Diagnosis not present

## 2016-04-17 DIAGNOSIS — J40 Bronchitis, not specified as acute or chronic: Secondary | ICD-10-CM | POA: Diagnosis not present

## 2016-04-18 DIAGNOSIS — R05 Cough: Secondary | ICD-10-CM | POA: Diagnosis not present

## 2016-04-19 DIAGNOSIS — Z7901 Long term (current) use of anticoagulants: Secondary | ICD-10-CM | POA: Diagnosis not present

## 2016-04-21 DIAGNOSIS — R0981 Nasal congestion: Secondary | ICD-10-CM | POA: Diagnosis not present

## 2016-04-21 DIAGNOSIS — Z7901 Long term (current) use of anticoagulants: Secondary | ICD-10-CM | POA: Diagnosis not present

## 2016-04-21 DIAGNOSIS — E876 Hypokalemia: Secondary | ICD-10-CM | POA: Diagnosis not present

## 2016-04-21 DIAGNOSIS — R0902 Hypoxemia: Secondary | ICD-10-CM | POA: Diagnosis not present

## 2016-04-21 DIAGNOSIS — I4891 Unspecified atrial fibrillation: Secondary | ICD-10-CM | POA: Diagnosis not present

## 2016-04-21 DIAGNOSIS — J9601 Acute respiratory failure with hypoxia: Secondary | ICD-10-CM | POA: Diagnosis not present

## 2016-04-21 DIAGNOSIS — Z8673 Personal history of transient ischemic attack (TIA), and cerebral infarction without residual deficits: Secondary | ICD-10-CM | POA: Diagnosis not present

## 2016-04-21 DIAGNOSIS — R05 Cough: Secondary | ICD-10-CM | POA: Diagnosis not present

## 2016-04-21 DIAGNOSIS — J181 Lobar pneumonia, unspecified organism: Secondary | ICD-10-CM | POA: Diagnosis not present

## 2016-04-21 DIAGNOSIS — R509 Fever, unspecified: Secondary | ICD-10-CM | POA: Diagnosis not present

## 2016-04-21 DIAGNOSIS — R0602 Shortness of breath: Secondary | ICD-10-CM | POA: Diagnosis not present

## 2016-04-21 DIAGNOSIS — A419 Sepsis, unspecified organism: Secondary | ICD-10-CM | POA: Diagnosis not present

## 2016-04-21 DIAGNOSIS — I679 Cerebrovascular disease, unspecified: Secondary | ICD-10-CM | POA: Diagnosis not present

## 2016-04-21 DIAGNOSIS — R066 Hiccough: Secondary | ICD-10-CM | POA: Diagnosis not present

## 2016-04-21 DIAGNOSIS — Z01810 Encounter for preprocedural cardiovascular examination: Secondary | ICD-10-CM | POA: Diagnosis not present

## 2016-04-21 DIAGNOSIS — Z79899 Other long term (current) drug therapy: Secondary | ICD-10-CM | POA: Diagnosis not present

## 2016-04-21 DIAGNOSIS — Z2821 Immunization not carried out because of patient refusal: Secondary | ICD-10-CM | POA: Diagnosis not present

## 2016-04-21 DIAGNOSIS — R918 Other nonspecific abnormal finding of lung field: Secondary | ICD-10-CM | POA: Diagnosis not present

## 2016-04-21 DIAGNOSIS — R279 Unspecified lack of coordination: Secondary | ICD-10-CM | POA: Diagnosis not present

## 2016-04-21 DIAGNOSIS — J189 Pneumonia, unspecified organism: Secondary | ICD-10-CM | POA: Diagnosis not present

## 2016-04-21 DIAGNOSIS — I110 Hypertensive heart disease with heart failure: Secondary | ICD-10-CM | POA: Diagnosis not present

## 2016-04-21 DIAGNOSIS — Z7401 Bed confinement status: Secondary | ICD-10-CM | POA: Diagnosis not present

## 2016-04-21 DIAGNOSIS — R2681 Unsteadiness on feet: Secondary | ICD-10-CM | POA: Diagnosis not present

## 2016-04-21 DIAGNOSIS — I5032 Chronic diastolic (congestive) heart failure: Secondary | ICD-10-CM | POA: Diagnosis not present

## 2016-04-27 DIAGNOSIS — Z7901 Long term (current) use of anticoagulants: Secondary | ICD-10-CM | POA: Diagnosis not present

## 2016-04-29 DIAGNOSIS — Z9181 History of falling: Secondary | ICD-10-CM | POA: Diagnosis not present

## 2016-04-29 DIAGNOSIS — I481 Persistent atrial fibrillation: Secondary | ICD-10-CM | POA: Diagnosis not present

## 2016-04-29 DIAGNOSIS — N390 Urinary tract infection, site not specified: Secondary | ICD-10-CM | POA: Diagnosis not present

## 2016-04-29 DIAGNOSIS — Z87440 Personal history of urinary (tract) infections: Secondary | ICD-10-CM | POA: Diagnosis not present

## 2016-04-29 DIAGNOSIS — Z792 Long term (current) use of antibiotics: Secondary | ICD-10-CM | POA: Diagnosis not present

## 2016-04-29 DIAGNOSIS — I110 Hypertensive heart disease with heart failure: Secondary | ICD-10-CM | POA: Diagnosis not present

## 2016-04-29 DIAGNOSIS — Z9981 Dependence on supplemental oxygen: Secondary | ICD-10-CM | POA: Diagnosis not present

## 2016-04-29 DIAGNOSIS — G309 Alzheimer's disease, unspecified: Secondary | ICD-10-CM | POA: Diagnosis not present

## 2016-04-29 DIAGNOSIS — Z5181 Encounter for therapeutic drug level monitoring: Secondary | ICD-10-CM | POA: Diagnosis not present

## 2016-04-29 DIAGNOSIS — Z7901 Long term (current) use of anticoagulants: Secondary | ICD-10-CM | POA: Diagnosis not present

## 2016-04-29 DIAGNOSIS — Z452 Encounter for adjustment and management of vascular access device: Secondary | ICD-10-CM | POA: Diagnosis not present

## 2016-04-29 DIAGNOSIS — R911 Solitary pulmonary nodule: Secondary | ICD-10-CM | POA: Diagnosis not present

## 2016-04-29 DIAGNOSIS — I5032 Chronic diastolic (congestive) heart failure: Secondary | ICD-10-CM | POA: Diagnosis not present

## 2016-05-01 DIAGNOSIS — I10 Essential (primary) hypertension: Secondary | ICD-10-CM | POA: Diagnosis not present

## 2016-05-01 DIAGNOSIS — Z7901 Long term (current) use of anticoagulants: Secondary | ICD-10-CM | POA: Diagnosis not present

## 2016-05-01 DIAGNOSIS — I5032 Chronic diastolic (congestive) heart failure: Secondary | ICD-10-CM | POA: Diagnosis not present

## 2016-05-01 DIAGNOSIS — I482 Chronic atrial fibrillation: Secondary | ICD-10-CM | POA: Diagnosis not present

## 2016-05-01 DIAGNOSIS — J181 Lobar pneumonia, unspecified organism: Secondary | ICD-10-CM | POA: Diagnosis not present

## 2016-05-02 DIAGNOSIS — Z7901 Long term (current) use of anticoagulants: Secondary | ICD-10-CM | POA: Diagnosis not present

## 2016-05-02 DIAGNOSIS — I110 Hypertensive heart disease with heart failure: Secondary | ICD-10-CM | POA: Diagnosis not present

## 2016-05-02 DIAGNOSIS — R911 Solitary pulmonary nodule: Secondary | ICD-10-CM | POA: Diagnosis not present

## 2016-05-02 DIAGNOSIS — Z452 Encounter for adjustment and management of vascular access device: Secondary | ICD-10-CM | POA: Diagnosis not present

## 2016-05-02 DIAGNOSIS — I5032 Chronic diastolic (congestive) heart failure: Secondary | ICD-10-CM | POA: Diagnosis not present

## 2016-05-02 DIAGNOSIS — I481 Persistent atrial fibrillation: Secondary | ICD-10-CM | POA: Diagnosis not present

## 2016-05-02 DIAGNOSIS — Z792 Long term (current) use of antibiotics: Secondary | ICD-10-CM | POA: Diagnosis not present

## 2016-05-02 DIAGNOSIS — Z9181 History of falling: Secondary | ICD-10-CM | POA: Diagnosis not present

## 2016-05-02 DIAGNOSIS — Z9981 Dependence on supplemental oxygen: Secondary | ICD-10-CM | POA: Diagnosis not present

## 2016-05-02 DIAGNOSIS — Z5181 Encounter for therapeutic drug level monitoring: Secondary | ICD-10-CM | POA: Diagnosis not present

## 2016-05-02 DIAGNOSIS — Z87440 Personal history of urinary (tract) infections: Secondary | ICD-10-CM | POA: Diagnosis not present

## 2016-05-02 DIAGNOSIS — G309 Alzheimer's disease, unspecified: Secondary | ICD-10-CM | POA: Diagnosis not present

## 2016-05-02 DIAGNOSIS — N390 Urinary tract infection, site not specified: Secondary | ICD-10-CM | POA: Diagnosis not present

## 2016-05-03 DIAGNOSIS — R911 Solitary pulmonary nodule: Secondary | ICD-10-CM | POA: Diagnosis not present

## 2016-05-03 DIAGNOSIS — Z792 Long term (current) use of antibiotics: Secondary | ICD-10-CM | POA: Diagnosis not present

## 2016-05-03 DIAGNOSIS — Z452 Encounter for adjustment and management of vascular access device: Secondary | ICD-10-CM | POA: Diagnosis not present

## 2016-05-03 DIAGNOSIS — I481 Persistent atrial fibrillation: Secondary | ICD-10-CM | POA: Diagnosis not present

## 2016-05-03 DIAGNOSIS — Z9181 History of falling: Secondary | ICD-10-CM | POA: Diagnosis not present

## 2016-05-03 DIAGNOSIS — Z7901 Long term (current) use of anticoagulants: Secondary | ICD-10-CM | POA: Diagnosis not present

## 2016-05-03 DIAGNOSIS — G309 Alzheimer's disease, unspecified: Secondary | ICD-10-CM | POA: Diagnosis not present

## 2016-05-03 DIAGNOSIS — I5032 Chronic diastolic (congestive) heart failure: Secondary | ICD-10-CM | POA: Diagnosis not present

## 2016-05-03 DIAGNOSIS — Z5181 Encounter for therapeutic drug level monitoring: Secondary | ICD-10-CM | POA: Diagnosis not present

## 2016-05-03 DIAGNOSIS — Z87440 Personal history of urinary (tract) infections: Secondary | ICD-10-CM | POA: Diagnosis not present

## 2016-05-03 DIAGNOSIS — Z9981 Dependence on supplemental oxygen: Secondary | ICD-10-CM | POA: Diagnosis not present

## 2016-05-03 DIAGNOSIS — N390 Urinary tract infection, site not specified: Secondary | ICD-10-CM | POA: Diagnosis not present

## 2016-05-03 DIAGNOSIS — I110 Hypertensive heart disease with heart failure: Secondary | ICD-10-CM | POA: Diagnosis not present

## 2016-05-05 DIAGNOSIS — Z9981 Dependence on supplemental oxygen: Secondary | ICD-10-CM | POA: Diagnosis not present

## 2016-05-05 DIAGNOSIS — Z5181 Encounter for therapeutic drug level monitoring: Secondary | ICD-10-CM | POA: Diagnosis not present

## 2016-05-05 DIAGNOSIS — Z9181 History of falling: Secondary | ICD-10-CM | POA: Diagnosis not present

## 2016-05-05 DIAGNOSIS — I5032 Chronic diastolic (congestive) heart failure: Secondary | ICD-10-CM | POA: Diagnosis not present

## 2016-05-05 DIAGNOSIS — I110 Hypertensive heart disease with heart failure: Secondary | ICD-10-CM | POA: Diagnosis not present

## 2016-05-05 DIAGNOSIS — R911 Solitary pulmonary nodule: Secondary | ICD-10-CM | POA: Diagnosis not present

## 2016-05-05 DIAGNOSIS — I481 Persistent atrial fibrillation: Secondary | ICD-10-CM | POA: Diagnosis not present

## 2016-05-05 DIAGNOSIS — Z7901 Long term (current) use of anticoagulants: Secondary | ICD-10-CM | POA: Diagnosis not present

## 2016-05-05 DIAGNOSIS — Z452 Encounter for adjustment and management of vascular access device: Secondary | ICD-10-CM | POA: Diagnosis not present

## 2016-05-05 DIAGNOSIS — N390 Urinary tract infection, site not specified: Secondary | ICD-10-CM | POA: Diagnosis not present

## 2016-05-05 DIAGNOSIS — Z792 Long term (current) use of antibiotics: Secondary | ICD-10-CM | POA: Diagnosis not present

## 2016-05-05 DIAGNOSIS — Z87440 Personal history of urinary (tract) infections: Secondary | ICD-10-CM | POA: Diagnosis not present

## 2016-05-05 DIAGNOSIS — G309 Alzheimer's disease, unspecified: Secondary | ICD-10-CM | POA: Diagnosis not present

## 2016-05-08 DIAGNOSIS — I110 Hypertensive heart disease with heart failure: Secondary | ICD-10-CM | POA: Diagnosis not present

## 2016-05-08 DIAGNOSIS — Z87440 Personal history of urinary (tract) infections: Secondary | ICD-10-CM | POA: Diagnosis not present

## 2016-05-08 DIAGNOSIS — Z792 Long term (current) use of antibiotics: Secondary | ICD-10-CM | POA: Diagnosis not present

## 2016-05-08 DIAGNOSIS — G309 Alzheimer's disease, unspecified: Secondary | ICD-10-CM | POA: Diagnosis not present

## 2016-05-08 DIAGNOSIS — I481 Persistent atrial fibrillation: Secondary | ICD-10-CM | POA: Diagnosis not present

## 2016-05-08 DIAGNOSIS — N390 Urinary tract infection, site not specified: Secondary | ICD-10-CM | POA: Diagnosis not present

## 2016-05-08 DIAGNOSIS — Z452 Encounter for adjustment and management of vascular access device: Secondary | ICD-10-CM | POA: Diagnosis not present

## 2016-05-08 DIAGNOSIS — I5032 Chronic diastolic (congestive) heart failure: Secondary | ICD-10-CM | POA: Diagnosis not present

## 2016-05-08 DIAGNOSIS — Z7901 Long term (current) use of anticoagulants: Secondary | ICD-10-CM | POA: Diagnosis not present

## 2016-05-08 DIAGNOSIS — R911 Solitary pulmonary nodule: Secondary | ICD-10-CM | POA: Diagnosis not present

## 2016-05-08 DIAGNOSIS — Z5181 Encounter for therapeutic drug level monitoring: Secondary | ICD-10-CM | POA: Diagnosis not present

## 2016-05-08 DIAGNOSIS — Z9181 History of falling: Secondary | ICD-10-CM | POA: Diagnosis not present

## 2016-05-08 DIAGNOSIS — Z9981 Dependence on supplemental oxygen: Secondary | ICD-10-CM | POA: Diagnosis not present

## 2016-05-10 DIAGNOSIS — Z7901 Long term (current) use of anticoagulants: Secondary | ICD-10-CM | POA: Diagnosis not present

## 2016-05-11 DIAGNOSIS — R911 Solitary pulmonary nodule: Secondary | ICD-10-CM | POA: Diagnosis not present

## 2016-05-11 DIAGNOSIS — Z5181 Encounter for therapeutic drug level monitoring: Secondary | ICD-10-CM | POA: Diagnosis not present

## 2016-05-11 DIAGNOSIS — Z9981 Dependence on supplemental oxygen: Secondary | ICD-10-CM | POA: Diagnosis not present

## 2016-05-11 DIAGNOSIS — G309 Alzheimer's disease, unspecified: Secondary | ICD-10-CM | POA: Diagnosis not present

## 2016-05-11 DIAGNOSIS — Z7901 Long term (current) use of anticoagulants: Secondary | ICD-10-CM | POA: Diagnosis not present

## 2016-05-11 DIAGNOSIS — I5032 Chronic diastolic (congestive) heart failure: Secondary | ICD-10-CM | POA: Diagnosis not present

## 2016-05-11 DIAGNOSIS — Z87440 Personal history of urinary (tract) infections: Secondary | ICD-10-CM | POA: Diagnosis not present

## 2016-05-11 DIAGNOSIS — I481 Persistent atrial fibrillation: Secondary | ICD-10-CM | POA: Diagnosis not present

## 2016-05-11 DIAGNOSIS — Z792 Long term (current) use of antibiotics: Secondary | ICD-10-CM | POA: Diagnosis not present

## 2016-05-11 DIAGNOSIS — I110 Hypertensive heart disease with heart failure: Secondary | ICD-10-CM | POA: Diagnosis not present

## 2016-05-11 DIAGNOSIS — N390 Urinary tract infection, site not specified: Secondary | ICD-10-CM | POA: Diagnosis not present

## 2016-05-11 DIAGNOSIS — Z9181 History of falling: Secondary | ICD-10-CM | POA: Diagnosis not present

## 2016-05-11 DIAGNOSIS — Z452 Encounter for adjustment and management of vascular access device: Secondary | ICD-10-CM | POA: Diagnosis not present

## 2016-05-12 DIAGNOSIS — M25562 Pain in left knee: Secondary | ICD-10-CM | POA: Diagnosis not present

## 2016-05-12 DIAGNOSIS — M25551 Pain in right hip: Secondary | ICD-10-CM | POA: Diagnosis not present

## 2016-05-12 DIAGNOSIS — S3992XA Unspecified injury of lower back, initial encounter: Secondary | ICD-10-CM | POA: Diagnosis not present

## 2016-05-12 DIAGNOSIS — R51 Headache: Secondary | ICD-10-CM | POA: Diagnosis not present

## 2016-05-12 DIAGNOSIS — S199XXA Unspecified injury of neck, initial encounter: Secondary | ICD-10-CM | POA: Diagnosis not present

## 2016-05-12 DIAGNOSIS — M25569 Pain in unspecified knee: Secondary | ICD-10-CM | POA: Diagnosis not present

## 2016-05-12 DIAGNOSIS — S79911A Unspecified injury of right hip, initial encounter: Secondary | ICD-10-CM | POA: Diagnosis not present

## 2016-05-12 DIAGNOSIS — M25552 Pain in left hip: Secondary | ICD-10-CM | POA: Diagnosis not present

## 2016-05-12 DIAGNOSIS — M549 Dorsalgia, unspecified: Secondary | ICD-10-CM | POA: Diagnosis not present

## 2016-05-12 DIAGNOSIS — M25561 Pain in right knee: Secondary | ICD-10-CM | POA: Diagnosis not present

## 2016-05-12 DIAGNOSIS — S79912A Unspecified injury of left hip, initial encounter: Secondary | ICD-10-CM | POA: Diagnosis not present

## 2016-05-12 DIAGNOSIS — N390 Urinary tract infection, site not specified: Secondary | ICD-10-CM | POA: Diagnosis not present

## 2016-05-12 DIAGNOSIS — S0990XA Unspecified injury of head, initial encounter: Secondary | ICD-10-CM | POA: Diagnosis not present

## 2016-05-12 DIAGNOSIS — R319 Hematuria, unspecified: Secondary | ICD-10-CM | POA: Diagnosis not present

## 2016-05-15 DIAGNOSIS — Z9181 History of falling: Secondary | ICD-10-CM | POA: Diagnosis not present

## 2016-05-15 DIAGNOSIS — Z79899 Other long term (current) drug therapy: Secondary | ICD-10-CM | POA: Diagnosis not present

## 2016-05-15 DIAGNOSIS — R911 Solitary pulmonary nodule: Secondary | ICD-10-CM | POA: Diagnosis not present

## 2016-05-15 DIAGNOSIS — Z9981 Dependence on supplemental oxygen: Secondary | ICD-10-CM | POA: Diagnosis not present

## 2016-05-15 DIAGNOSIS — Z792 Long term (current) use of antibiotics: Secondary | ICD-10-CM | POA: Diagnosis not present

## 2016-05-15 DIAGNOSIS — Z7901 Long term (current) use of anticoagulants: Secondary | ICD-10-CM | POA: Diagnosis not present

## 2016-05-15 DIAGNOSIS — I482 Chronic atrial fibrillation: Secondary | ICD-10-CM | POA: Diagnosis not present

## 2016-05-15 DIAGNOSIS — I481 Persistent atrial fibrillation: Secondary | ICD-10-CM | POA: Diagnosis not present

## 2016-05-15 DIAGNOSIS — I10 Essential (primary) hypertension: Secondary | ICD-10-CM | POA: Diagnosis not present

## 2016-05-15 DIAGNOSIS — Z5181 Encounter for therapeutic drug level monitoring: Secondary | ICD-10-CM | POA: Diagnosis not present

## 2016-05-15 DIAGNOSIS — Z87440 Personal history of urinary (tract) infections: Secondary | ICD-10-CM | POA: Diagnosis not present

## 2016-05-15 DIAGNOSIS — G309 Alzheimer's disease, unspecified: Secondary | ICD-10-CM | POA: Diagnosis not present

## 2016-05-15 DIAGNOSIS — I5032 Chronic diastolic (congestive) heart failure: Secondary | ICD-10-CM | POA: Diagnosis not present

## 2016-05-15 DIAGNOSIS — A4901 Methicillin susceptible Staphylococcus aureus infection, unspecified site: Secondary | ICD-10-CM | POA: Diagnosis not present

## 2016-05-15 DIAGNOSIS — I110 Hypertensive heart disease with heart failure: Secondary | ICD-10-CM | POA: Diagnosis not present

## 2016-05-15 DIAGNOSIS — Z452 Encounter for adjustment and management of vascular access device: Secondary | ICD-10-CM | POA: Diagnosis not present

## 2016-05-15 DIAGNOSIS — N390 Urinary tract infection, site not specified: Secondary | ICD-10-CM | POA: Diagnosis not present

## 2016-05-16 DIAGNOSIS — N390 Urinary tract infection, site not specified: Secondary | ICD-10-CM | POA: Diagnosis not present

## 2016-05-17 ENCOUNTER — Ambulatory Visit: Payer: Medicare Other | Admitting: Internal Medicine

## 2016-05-17 DIAGNOSIS — R911 Solitary pulmonary nodule: Secondary | ICD-10-CM | POA: Diagnosis not present

## 2016-05-17 DIAGNOSIS — Z792 Long term (current) use of antibiotics: Secondary | ICD-10-CM | POA: Diagnosis not present

## 2016-05-17 DIAGNOSIS — I5032 Chronic diastolic (congestive) heart failure: Secondary | ICD-10-CM | POA: Diagnosis not present

## 2016-05-17 DIAGNOSIS — I110 Hypertensive heart disease with heart failure: Secondary | ICD-10-CM | POA: Diagnosis not present

## 2016-05-17 DIAGNOSIS — Z9181 History of falling: Secondary | ICD-10-CM | POA: Diagnosis not present

## 2016-05-17 DIAGNOSIS — I481 Persistent atrial fibrillation: Secondary | ICD-10-CM | POA: Diagnosis not present

## 2016-05-17 DIAGNOSIS — Z7901 Long term (current) use of anticoagulants: Secondary | ICD-10-CM | POA: Diagnosis not present

## 2016-05-17 DIAGNOSIS — N390 Urinary tract infection, site not specified: Secondary | ICD-10-CM | POA: Diagnosis not present

## 2016-05-17 DIAGNOSIS — Z5181 Encounter for therapeutic drug level monitoring: Secondary | ICD-10-CM | POA: Diagnosis not present

## 2016-05-17 DIAGNOSIS — Z87440 Personal history of urinary (tract) infections: Secondary | ICD-10-CM | POA: Diagnosis not present

## 2016-05-17 DIAGNOSIS — G309 Alzheimer's disease, unspecified: Secondary | ICD-10-CM | POA: Diagnosis not present

## 2016-05-17 DIAGNOSIS — Z9981 Dependence on supplemental oxygen: Secondary | ICD-10-CM | POA: Diagnosis not present

## 2016-05-17 DIAGNOSIS — Z452 Encounter for adjustment and management of vascular access device: Secondary | ICD-10-CM | POA: Diagnosis not present

## 2016-05-18 DIAGNOSIS — G309 Alzheimer's disease, unspecified: Secondary | ICD-10-CM | POA: Diagnosis not present

## 2016-05-18 DIAGNOSIS — N390 Urinary tract infection, site not specified: Secondary | ICD-10-CM | POA: Diagnosis not present

## 2016-05-18 DIAGNOSIS — I5032 Chronic diastolic (congestive) heart failure: Secondary | ICD-10-CM | POA: Diagnosis not present

## 2016-05-18 DIAGNOSIS — Z7901 Long term (current) use of anticoagulants: Secondary | ICD-10-CM | POA: Diagnosis not present

## 2016-05-18 DIAGNOSIS — Z9181 History of falling: Secondary | ICD-10-CM | POA: Diagnosis not present

## 2016-05-18 DIAGNOSIS — I110 Hypertensive heart disease with heart failure: Secondary | ICD-10-CM | POA: Diagnosis not present

## 2016-05-18 DIAGNOSIS — Z79899 Other long term (current) drug therapy: Secondary | ICD-10-CM | POA: Diagnosis not present

## 2016-05-18 DIAGNOSIS — Z87440 Personal history of urinary (tract) infections: Secondary | ICD-10-CM | POA: Diagnosis not present

## 2016-05-18 DIAGNOSIS — Z452 Encounter for adjustment and management of vascular access device: Secondary | ICD-10-CM | POA: Diagnosis not present

## 2016-05-18 DIAGNOSIS — Z792 Long term (current) use of antibiotics: Secondary | ICD-10-CM | POA: Diagnosis not present

## 2016-05-18 DIAGNOSIS — Z9981 Dependence on supplemental oxygen: Secondary | ICD-10-CM | POA: Diagnosis not present

## 2016-05-18 DIAGNOSIS — R911 Solitary pulmonary nodule: Secondary | ICD-10-CM | POA: Diagnosis not present

## 2016-05-18 DIAGNOSIS — Z5181 Encounter for therapeutic drug level monitoring: Secondary | ICD-10-CM | POA: Diagnosis not present

## 2016-05-18 DIAGNOSIS — I481 Persistent atrial fibrillation: Secondary | ICD-10-CM | POA: Diagnosis not present

## 2016-05-19 DIAGNOSIS — Z9181 History of falling: Secondary | ICD-10-CM | POA: Diagnosis not present

## 2016-05-19 DIAGNOSIS — Z452 Encounter for adjustment and management of vascular access device: Secondary | ICD-10-CM | POA: Diagnosis not present

## 2016-05-19 DIAGNOSIS — I5032 Chronic diastolic (congestive) heart failure: Secondary | ICD-10-CM | POA: Diagnosis not present

## 2016-05-19 DIAGNOSIS — I110 Hypertensive heart disease with heart failure: Secondary | ICD-10-CM | POA: Diagnosis not present

## 2016-05-19 DIAGNOSIS — Z9981 Dependence on supplemental oxygen: Secondary | ICD-10-CM | POA: Diagnosis not present

## 2016-05-19 DIAGNOSIS — G309 Alzheimer's disease, unspecified: Secondary | ICD-10-CM | POA: Diagnosis not present

## 2016-05-19 DIAGNOSIS — N390 Urinary tract infection, site not specified: Secondary | ICD-10-CM | POA: Diagnosis not present

## 2016-05-19 DIAGNOSIS — Z7901 Long term (current) use of anticoagulants: Secondary | ICD-10-CM | POA: Diagnosis not present

## 2016-05-19 DIAGNOSIS — Z792 Long term (current) use of antibiotics: Secondary | ICD-10-CM | POA: Diagnosis not present

## 2016-05-19 DIAGNOSIS — Z5181 Encounter for therapeutic drug level monitoring: Secondary | ICD-10-CM | POA: Diagnosis not present

## 2016-05-19 DIAGNOSIS — I481 Persistent atrial fibrillation: Secondary | ICD-10-CM | POA: Diagnosis not present

## 2016-05-19 DIAGNOSIS — R911 Solitary pulmonary nodule: Secondary | ICD-10-CM | POA: Diagnosis not present

## 2016-05-19 DIAGNOSIS — Z87440 Personal history of urinary (tract) infections: Secondary | ICD-10-CM | POA: Diagnosis not present

## 2016-05-20 DIAGNOSIS — Z452 Encounter for adjustment and management of vascular access device: Secondary | ICD-10-CM | POA: Diagnosis not present

## 2016-05-20 DIAGNOSIS — Z792 Long term (current) use of antibiotics: Secondary | ICD-10-CM | POA: Diagnosis not present

## 2016-05-20 DIAGNOSIS — N390 Urinary tract infection, site not specified: Secondary | ICD-10-CM | POA: Diagnosis not present

## 2016-05-20 DIAGNOSIS — I110 Hypertensive heart disease with heart failure: Secondary | ICD-10-CM | POA: Diagnosis not present

## 2016-05-20 DIAGNOSIS — J189 Pneumonia, unspecified organism: Secondary | ICD-10-CM | POA: Diagnosis not present

## 2016-05-20 DIAGNOSIS — Z9181 History of falling: Secondary | ICD-10-CM | POA: Diagnosis not present

## 2016-05-20 DIAGNOSIS — Z5181 Encounter for therapeutic drug level monitoring: Secondary | ICD-10-CM | POA: Diagnosis not present

## 2016-05-20 DIAGNOSIS — Z9981 Dependence on supplemental oxygen: Secondary | ICD-10-CM | POA: Diagnosis not present

## 2016-05-20 DIAGNOSIS — G309 Alzheimer's disease, unspecified: Secondary | ICD-10-CM | POA: Diagnosis not present

## 2016-05-20 DIAGNOSIS — Z7901 Long term (current) use of anticoagulants: Secondary | ICD-10-CM | POA: Diagnosis not present

## 2016-05-20 DIAGNOSIS — I5032 Chronic diastolic (congestive) heart failure: Secondary | ICD-10-CM | POA: Diagnosis not present

## 2016-05-20 DIAGNOSIS — R911 Solitary pulmonary nodule: Secondary | ICD-10-CM | POA: Diagnosis not present

## 2016-05-20 DIAGNOSIS — I481 Persistent atrial fibrillation: Secondary | ICD-10-CM | POA: Diagnosis not present

## 2016-05-20 DIAGNOSIS — Z87440 Personal history of urinary (tract) infections: Secondary | ICD-10-CM | POA: Diagnosis not present

## 2016-05-21 DIAGNOSIS — I110 Hypertensive heart disease with heart failure: Secondary | ICD-10-CM | POA: Diagnosis not present

## 2016-05-21 DIAGNOSIS — G309 Alzheimer's disease, unspecified: Secondary | ICD-10-CM | POA: Diagnosis not present

## 2016-05-21 DIAGNOSIS — Z7901 Long term (current) use of anticoagulants: Secondary | ICD-10-CM | POA: Diagnosis not present

## 2016-05-21 DIAGNOSIS — Z792 Long term (current) use of antibiotics: Secondary | ICD-10-CM | POA: Diagnosis not present

## 2016-05-21 DIAGNOSIS — Z5181 Encounter for therapeutic drug level monitoring: Secondary | ICD-10-CM | POA: Diagnosis not present

## 2016-05-21 DIAGNOSIS — I481 Persistent atrial fibrillation: Secondary | ICD-10-CM | POA: Diagnosis not present

## 2016-05-21 DIAGNOSIS — Z452 Encounter for adjustment and management of vascular access device: Secondary | ICD-10-CM | POA: Diagnosis not present

## 2016-05-21 DIAGNOSIS — N390 Urinary tract infection, site not specified: Secondary | ICD-10-CM | POA: Diagnosis not present

## 2016-05-21 DIAGNOSIS — Z9981 Dependence on supplemental oxygen: Secondary | ICD-10-CM | POA: Diagnosis not present

## 2016-05-21 DIAGNOSIS — Z9181 History of falling: Secondary | ICD-10-CM | POA: Diagnosis not present

## 2016-05-21 DIAGNOSIS — I5032 Chronic diastolic (congestive) heart failure: Secondary | ICD-10-CM | POA: Diagnosis not present

## 2016-05-21 DIAGNOSIS — R911 Solitary pulmonary nodule: Secondary | ICD-10-CM | POA: Diagnosis not present

## 2016-05-21 DIAGNOSIS — Z87440 Personal history of urinary (tract) infections: Secondary | ICD-10-CM | POA: Diagnosis not present

## 2016-05-22 DIAGNOSIS — Z452 Encounter for adjustment and management of vascular access device: Secondary | ICD-10-CM | POA: Diagnosis not present

## 2016-05-22 DIAGNOSIS — R911 Solitary pulmonary nodule: Secondary | ICD-10-CM | POA: Diagnosis not present

## 2016-05-22 DIAGNOSIS — Z87440 Personal history of urinary (tract) infections: Secondary | ICD-10-CM | POA: Diagnosis not present

## 2016-05-22 DIAGNOSIS — Z7901 Long term (current) use of anticoagulants: Secondary | ICD-10-CM | POA: Diagnosis not present

## 2016-05-22 DIAGNOSIS — N390 Urinary tract infection, site not specified: Secondary | ICD-10-CM | POA: Diagnosis not present

## 2016-05-22 DIAGNOSIS — I5032 Chronic diastolic (congestive) heart failure: Secondary | ICD-10-CM | POA: Diagnosis not present

## 2016-05-22 DIAGNOSIS — Z5181 Encounter for therapeutic drug level monitoring: Secondary | ICD-10-CM | POA: Diagnosis not present

## 2016-05-22 DIAGNOSIS — I110 Hypertensive heart disease with heart failure: Secondary | ICD-10-CM | POA: Diagnosis not present

## 2016-05-22 DIAGNOSIS — Z9981 Dependence on supplemental oxygen: Secondary | ICD-10-CM | POA: Diagnosis not present

## 2016-05-22 DIAGNOSIS — I481 Persistent atrial fibrillation: Secondary | ICD-10-CM | POA: Diagnosis not present

## 2016-05-22 DIAGNOSIS — G309 Alzheimer's disease, unspecified: Secondary | ICD-10-CM | POA: Diagnosis not present

## 2016-05-22 DIAGNOSIS — Z9181 History of falling: Secondary | ICD-10-CM | POA: Diagnosis not present

## 2016-05-22 DIAGNOSIS — Z792 Long term (current) use of antibiotics: Secondary | ICD-10-CM | POA: Diagnosis not present

## 2016-05-23 DIAGNOSIS — N390 Urinary tract infection, site not specified: Secondary | ICD-10-CM | POA: Diagnosis not present

## 2016-05-23 DIAGNOSIS — Z452 Encounter for adjustment and management of vascular access device: Secondary | ICD-10-CM | POA: Diagnosis not present

## 2016-05-23 DIAGNOSIS — I481 Persistent atrial fibrillation: Secondary | ICD-10-CM | POA: Diagnosis not present

## 2016-05-23 DIAGNOSIS — Z792 Long term (current) use of antibiotics: Secondary | ICD-10-CM | POA: Diagnosis not present

## 2016-05-24 DIAGNOSIS — Z7901 Long term (current) use of anticoagulants: Secondary | ICD-10-CM | POA: Diagnosis not present

## 2016-05-25 DIAGNOSIS — I110 Hypertensive heart disease with heart failure: Secondary | ICD-10-CM | POA: Diagnosis not present

## 2016-05-25 DIAGNOSIS — Z5181 Encounter for therapeutic drug level monitoring: Secondary | ICD-10-CM | POA: Diagnosis not present

## 2016-05-25 DIAGNOSIS — Z9981 Dependence on supplemental oxygen: Secondary | ICD-10-CM | POA: Diagnosis not present

## 2016-05-25 DIAGNOSIS — Z9181 History of falling: Secondary | ICD-10-CM | POA: Diagnosis not present

## 2016-05-25 DIAGNOSIS — R911 Solitary pulmonary nodule: Secondary | ICD-10-CM | POA: Diagnosis not present

## 2016-05-25 DIAGNOSIS — I5032 Chronic diastolic (congestive) heart failure: Secondary | ICD-10-CM | POA: Diagnosis not present

## 2016-05-25 DIAGNOSIS — N390 Urinary tract infection, site not specified: Secondary | ICD-10-CM | POA: Diagnosis not present

## 2016-05-25 DIAGNOSIS — I481 Persistent atrial fibrillation: Secondary | ICD-10-CM | POA: Diagnosis not present

## 2016-05-25 DIAGNOSIS — Z792 Long term (current) use of antibiotics: Secondary | ICD-10-CM | POA: Diagnosis not present

## 2016-05-25 DIAGNOSIS — Z452 Encounter for adjustment and management of vascular access device: Secondary | ICD-10-CM | POA: Diagnosis not present

## 2016-05-25 DIAGNOSIS — Z87440 Personal history of urinary (tract) infections: Secondary | ICD-10-CM | POA: Diagnosis not present

## 2016-05-25 DIAGNOSIS — Z7901 Long term (current) use of anticoagulants: Secondary | ICD-10-CM | POA: Diagnosis not present

## 2016-05-25 DIAGNOSIS — G309 Alzheimer's disease, unspecified: Secondary | ICD-10-CM | POA: Diagnosis not present

## 2016-05-26 DIAGNOSIS — J189 Pneumonia, unspecified organism: Secondary | ICD-10-CM | POA: Diagnosis not present

## 2016-05-26 DIAGNOSIS — S299XXA Unspecified injury of thorax, initial encounter: Secondary | ICD-10-CM | POA: Diagnosis not present

## 2016-05-26 DIAGNOSIS — S3992XA Unspecified injury of lower back, initial encounter: Secondary | ICD-10-CM | POA: Diagnosis not present

## 2016-05-26 DIAGNOSIS — R0781 Pleurodynia: Secondary | ICD-10-CM | POA: Diagnosis not present

## 2016-05-26 DIAGNOSIS — M549 Dorsalgia, unspecified: Secondary | ICD-10-CM | POA: Diagnosis not present

## 2016-05-26 DIAGNOSIS — Z743 Need for continuous supervision: Secondary | ICD-10-CM | POA: Diagnosis not present

## 2016-05-26 DIAGNOSIS — R0902 Hypoxemia: Secondary | ICD-10-CM | POA: Diagnosis not present

## 2016-05-26 DIAGNOSIS — R2681 Unsteadiness on feet: Secondary | ICD-10-CM | POA: Diagnosis not present

## 2016-05-26 DIAGNOSIS — M5489 Other dorsalgia: Secondary | ICD-10-CM | POA: Diagnosis not present

## 2016-05-26 DIAGNOSIS — R279 Unspecified lack of coordination: Secondary | ICD-10-CM | POA: Diagnosis not present

## 2016-05-26 DIAGNOSIS — M545 Low back pain: Secondary | ICD-10-CM | POA: Diagnosis not present

## 2016-05-26 DIAGNOSIS — M546 Pain in thoracic spine: Secondary | ICD-10-CM | POA: Diagnosis not present

## 2016-05-31 DIAGNOSIS — Z7901 Long term (current) use of anticoagulants: Secondary | ICD-10-CM | POA: Diagnosis not present

## 2016-05-31 DIAGNOSIS — Z5181 Encounter for therapeutic drug level monitoring: Secondary | ICD-10-CM | POA: Diagnosis not present

## 2016-05-31 DIAGNOSIS — R911 Solitary pulmonary nodule: Secondary | ICD-10-CM | POA: Diagnosis not present

## 2016-05-31 DIAGNOSIS — Z792 Long term (current) use of antibiotics: Secondary | ICD-10-CM | POA: Diagnosis not present

## 2016-05-31 DIAGNOSIS — N390 Urinary tract infection, site not specified: Secondary | ICD-10-CM | POA: Diagnosis not present

## 2016-05-31 DIAGNOSIS — G309 Alzheimer's disease, unspecified: Secondary | ICD-10-CM | POA: Diagnosis not present

## 2016-05-31 DIAGNOSIS — Z452 Encounter for adjustment and management of vascular access device: Secondary | ICD-10-CM | POA: Diagnosis not present

## 2016-05-31 DIAGNOSIS — I481 Persistent atrial fibrillation: Secondary | ICD-10-CM | POA: Diagnosis not present

## 2016-05-31 DIAGNOSIS — I5032 Chronic diastolic (congestive) heart failure: Secondary | ICD-10-CM | POA: Diagnosis not present

## 2016-05-31 DIAGNOSIS — Z9181 History of falling: Secondary | ICD-10-CM | POA: Diagnosis not present

## 2016-05-31 DIAGNOSIS — Z9981 Dependence on supplemental oxygen: Secondary | ICD-10-CM | POA: Diagnosis not present

## 2016-05-31 DIAGNOSIS — Z87440 Personal history of urinary (tract) infections: Secondary | ICD-10-CM | POA: Diagnosis not present

## 2016-05-31 DIAGNOSIS — I110 Hypertensive heart disease with heart failure: Secondary | ICD-10-CM | POA: Diagnosis not present

## 2016-06-02 DIAGNOSIS — R930 Abnormal findings on diagnostic imaging of skull and head, not elsewhere classified: Secondary | ICD-10-CM | POA: Diagnosis not present

## 2016-06-02 DIAGNOSIS — S0990XA Unspecified injury of head, initial encounter: Secondary | ICD-10-CM | POA: Diagnosis not present

## 2016-06-02 DIAGNOSIS — Z792 Long term (current) use of antibiotics: Secondary | ICD-10-CM | POA: Diagnosis not present

## 2016-06-02 DIAGNOSIS — R911 Solitary pulmonary nodule: Secondary | ICD-10-CM | POA: Diagnosis not present

## 2016-06-02 DIAGNOSIS — S32592A Other specified fracture of left pubis, initial encounter for closed fracture: Secondary | ICD-10-CM | POA: Diagnosis not present

## 2016-06-02 DIAGNOSIS — Z452 Encounter for adjustment and management of vascular access device: Secondary | ICD-10-CM | POA: Diagnosis not present

## 2016-06-02 DIAGNOSIS — S3993XA Unspecified injury of pelvis, initial encounter: Secondary | ICD-10-CM | POA: Diagnosis not present

## 2016-06-02 DIAGNOSIS — Z8673 Personal history of transient ischemic attack (TIA), and cerebral infarction without residual deficits: Secondary | ICD-10-CM | POA: Diagnosis not present

## 2016-06-02 DIAGNOSIS — I481 Persistent atrial fibrillation: Secondary | ICD-10-CM | POA: Diagnosis not present

## 2016-06-02 DIAGNOSIS — Z87440 Personal history of urinary (tract) infections: Secondary | ICD-10-CM | POA: Diagnosis not present

## 2016-06-02 DIAGNOSIS — Z5181 Encounter for therapeutic drug level monitoring: Secondary | ICD-10-CM | POA: Diagnosis not present

## 2016-06-02 DIAGNOSIS — Z9181 History of falling: Secondary | ICD-10-CM | POA: Diagnosis not present

## 2016-06-02 DIAGNOSIS — S299XXA Unspecified injury of thorax, initial encounter: Secondary | ICD-10-CM | POA: Diagnosis not present

## 2016-06-02 DIAGNOSIS — I5032 Chronic diastolic (congestive) heart failure: Secondary | ICD-10-CM | POA: Diagnosis not present

## 2016-06-02 DIAGNOSIS — S3992XA Unspecified injury of lower back, initial encounter: Secondary | ICD-10-CM | POA: Diagnosis not present

## 2016-06-02 DIAGNOSIS — Z9981 Dependence on supplemental oxygen: Secondary | ICD-10-CM | POA: Diagnosis not present

## 2016-06-02 DIAGNOSIS — M545 Low back pain: Secondary | ICD-10-CM | POA: Diagnosis not present

## 2016-06-02 DIAGNOSIS — Z7901 Long term (current) use of anticoagulants: Secondary | ICD-10-CM | POA: Diagnosis not present

## 2016-06-02 DIAGNOSIS — I480 Paroxysmal atrial fibrillation: Secondary | ICD-10-CM | POA: Diagnosis not present

## 2016-06-02 DIAGNOSIS — S329XXA Fracture of unspecified parts of lumbosacral spine and pelvis, initial encounter for closed fracture: Secondary | ICD-10-CM | POA: Diagnosis not present

## 2016-06-02 DIAGNOSIS — G309 Alzheimer's disease, unspecified: Secondary | ICD-10-CM | POA: Diagnosis not present

## 2016-06-02 DIAGNOSIS — R531 Weakness: Secondary | ICD-10-CM | POA: Diagnosis not present

## 2016-06-02 DIAGNOSIS — N390 Urinary tract infection, site not specified: Secondary | ICD-10-CM | POA: Diagnosis not present

## 2016-06-02 DIAGNOSIS — I110 Hypertensive heart disease with heart failure: Secondary | ICD-10-CM | POA: Diagnosis not present

## 2016-06-02 DIAGNOSIS — I4891 Unspecified atrial fibrillation: Secondary | ICD-10-CM | POA: Diagnosis not present

## 2016-06-02 DIAGNOSIS — R51 Headache: Secondary | ICD-10-CM | POA: Diagnosis not present

## 2016-06-05 ENCOUNTER — Ambulatory Visit: Payer: Medicare Other | Admitting: Internal Medicine

## 2016-06-05 DIAGNOSIS — S329XXS Fracture of unspecified parts of lumbosacral spine and pelvis, sequela: Secondary | ICD-10-CM | POA: Diagnosis not present

## 2016-06-07 DIAGNOSIS — Z7901 Long term (current) use of anticoagulants: Secondary | ICD-10-CM | POA: Diagnosis not present

## 2016-06-09 DIAGNOSIS — Z5181 Encounter for therapeutic drug level monitoring: Secondary | ICD-10-CM | POA: Diagnosis not present

## 2016-06-09 DIAGNOSIS — Z87440 Personal history of urinary (tract) infections: Secondary | ICD-10-CM | POA: Diagnosis not present

## 2016-06-09 DIAGNOSIS — I481 Persistent atrial fibrillation: Secondary | ICD-10-CM | POA: Diagnosis not present

## 2016-06-09 DIAGNOSIS — I5032 Chronic diastolic (congestive) heart failure: Secondary | ICD-10-CM | POA: Diagnosis not present

## 2016-06-09 DIAGNOSIS — Z9981 Dependence on supplemental oxygen: Secondary | ICD-10-CM | POA: Diagnosis not present

## 2016-06-09 DIAGNOSIS — N390 Urinary tract infection, site not specified: Secondary | ICD-10-CM | POA: Diagnosis not present

## 2016-06-09 DIAGNOSIS — Z792 Long term (current) use of antibiotics: Secondary | ICD-10-CM | POA: Diagnosis not present

## 2016-06-09 DIAGNOSIS — Z452 Encounter for adjustment and management of vascular access device: Secondary | ICD-10-CM | POA: Diagnosis not present

## 2016-06-09 DIAGNOSIS — R911 Solitary pulmonary nodule: Secondary | ICD-10-CM | POA: Diagnosis not present

## 2016-06-09 DIAGNOSIS — Z7901 Long term (current) use of anticoagulants: Secondary | ICD-10-CM | POA: Diagnosis not present

## 2016-06-09 DIAGNOSIS — Z9181 History of falling: Secondary | ICD-10-CM | POA: Diagnosis not present

## 2016-06-09 DIAGNOSIS — G309 Alzheimer's disease, unspecified: Secondary | ICD-10-CM | POA: Diagnosis not present

## 2016-06-09 DIAGNOSIS — I110 Hypertensive heart disease with heart failure: Secondary | ICD-10-CM | POA: Diagnosis not present

## 2016-06-13 DIAGNOSIS — Z7901 Long term (current) use of anticoagulants: Secondary | ICD-10-CM | POA: Diagnosis not present

## 2016-06-16 ENCOUNTER — Ambulatory Visit: Payer: Self-pay | Admitting: General Practice

## 2016-06-16 DIAGNOSIS — Z7901 Long term (current) use of anticoagulants: Secondary | ICD-10-CM | POA: Diagnosis not present

## 2016-06-17 DIAGNOSIS — N390 Urinary tract infection, site not specified: Secondary | ICD-10-CM | POA: Diagnosis not present

## 2016-06-17 DIAGNOSIS — R911 Solitary pulmonary nodule: Secondary | ICD-10-CM | POA: Diagnosis not present

## 2016-06-17 DIAGNOSIS — I481 Persistent atrial fibrillation: Secondary | ICD-10-CM | POA: Diagnosis not present

## 2016-06-17 DIAGNOSIS — I110 Hypertensive heart disease with heart failure: Secondary | ICD-10-CM | POA: Diagnosis not present

## 2016-06-17 DIAGNOSIS — Z9181 History of falling: Secondary | ICD-10-CM | POA: Diagnosis not present

## 2016-06-17 DIAGNOSIS — I5032 Chronic diastolic (congestive) heart failure: Secondary | ICD-10-CM | POA: Diagnosis not present

## 2016-06-17 DIAGNOSIS — G309 Alzheimer's disease, unspecified: Secondary | ICD-10-CM | POA: Diagnosis not present

## 2016-06-17 DIAGNOSIS — Z5181 Encounter for therapeutic drug level monitoring: Secondary | ICD-10-CM | POA: Diagnosis not present

## 2016-06-17 DIAGNOSIS — Z9981 Dependence on supplemental oxygen: Secondary | ICD-10-CM | POA: Diagnosis not present

## 2016-06-17 DIAGNOSIS — Z7901 Long term (current) use of anticoagulants: Secondary | ICD-10-CM | POA: Diagnosis not present

## 2016-06-17 DIAGNOSIS — Z792 Long term (current) use of antibiotics: Secondary | ICD-10-CM | POA: Diagnosis not present

## 2016-06-17 DIAGNOSIS — Z87440 Personal history of urinary (tract) infections: Secondary | ICD-10-CM | POA: Diagnosis not present

## 2016-06-17 DIAGNOSIS — Z452 Encounter for adjustment and management of vascular access device: Secondary | ICD-10-CM | POA: Diagnosis not present

## 2016-06-19 DIAGNOSIS — E785 Hyperlipidemia, unspecified: Secondary | ICD-10-CM | POA: Diagnosis not present

## 2016-06-19 DIAGNOSIS — Z7901 Long term (current) use of anticoagulants: Secondary | ICD-10-CM | POA: Diagnosis not present

## 2016-06-19 DIAGNOSIS — K219 Gastro-esophageal reflux disease without esophagitis: Secondary | ICD-10-CM | POA: Diagnosis not present

## 2016-06-19 DIAGNOSIS — R5381 Other malaise: Secondary | ICD-10-CM | POA: Diagnosis not present

## 2016-06-21 DIAGNOSIS — Z79899 Other long term (current) drug therapy: Secondary | ICD-10-CM | POA: Diagnosis not present

## 2016-06-21 DIAGNOSIS — E782 Mixed hyperlipidemia: Secondary | ICD-10-CM | POA: Diagnosis not present

## 2016-06-21 DIAGNOSIS — D518 Other vitamin B12 deficiency anemias: Secondary | ICD-10-CM | POA: Diagnosis not present

## 2016-06-21 DIAGNOSIS — E119 Type 2 diabetes mellitus without complications: Secondary | ICD-10-CM | POA: Diagnosis not present

## 2016-06-21 DIAGNOSIS — E038 Other specified hypothyroidism: Secondary | ICD-10-CM | POA: Diagnosis not present

## 2016-06-21 DIAGNOSIS — E559 Vitamin D deficiency, unspecified: Secondary | ICD-10-CM | POA: Diagnosis not present

## 2016-06-22 DIAGNOSIS — Z7901 Long term (current) use of anticoagulants: Secondary | ICD-10-CM | POA: Diagnosis not present

## 2016-06-26 DIAGNOSIS — Z5181 Encounter for therapeutic drug level monitoring: Secondary | ICD-10-CM | POA: Diagnosis not present

## 2016-06-26 DIAGNOSIS — Z9981 Dependence on supplemental oxygen: Secondary | ICD-10-CM | POA: Diagnosis not present

## 2016-06-26 DIAGNOSIS — N390 Urinary tract infection, site not specified: Secondary | ICD-10-CM | POA: Diagnosis not present

## 2016-06-26 DIAGNOSIS — I5032 Chronic diastolic (congestive) heart failure: Secondary | ICD-10-CM | POA: Diagnosis not present

## 2016-06-26 DIAGNOSIS — Z452 Encounter for adjustment and management of vascular access device: Secondary | ICD-10-CM | POA: Diagnosis not present

## 2016-06-26 DIAGNOSIS — I110 Hypertensive heart disease with heart failure: Secondary | ICD-10-CM | POA: Diagnosis not present

## 2016-06-26 DIAGNOSIS — Z792 Long term (current) use of antibiotics: Secondary | ICD-10-CM | POA: Diagnosis not present

## 2016-06-26 DIAGNOSIS — R911 Solitary pulmonary nodule: Secondary | ICD-10-CM | POA: Diagnosis not present

## 2016-06-26 DIAGNOSIS — Z7901 Long term (current) use of anticoagulants: Secondary | ICD-10-CM | POA: Diagnosis not present

## 2016-06-26 DIAGNOSIS — Z9181 History of falling: Secondary | ICD-10-CM | POA: Diagnosis not present

## 2016-06-26 DIAGNOSIS — Z87440 Personal history of urinary (tract) infections: Secondary | ICD-10-CM | POA: Diagnosis not present

## 2016-06-26 DIAGNOSIS — G309 Alzheimer's disease, unspecified: Secondary | ICD-10-CM | POA: Diagnosis not present

## 2016-06-26 DIAGNOSIS — R2681 Unsteadiness on feet: Secondary | ICD-10-CM | POA: Diagnosis not present

## 2016-06-26 DIAGNOSIS — I481 Persistent atrial fibrillation: Secondary | ICD-10-CM | POA: Diagnosis not present

## 2016-06-26 DIAGNOSIS — J189 Pneumonia, unspecified organism: Secondary | ICD-10-CM | POA: Diagnosis not present

## 2016-06-28 DIAGNOSIS — Z7901 Long term (current) use of anticoagulants: Secondary | ICD-10-CM | POA: Diagnosis not present

## 2016-07-01 DIAGNOSIS — R112 Nausea with vomiting, unspecified: Secondary | ICD-10-CM | POA: Diagnosis not present

## 2016-07-01 DIAGNOSIS — N390 Urinary tract infection, site not specified: Secondary | ICD-10-CM | POA: Diagnosis not present

## 2016-07-01 DIAGNOSIS — E860 Dehydration: Secondary | ICD-10-CM | POA: Diagnosis not present

## 2016-07-01 DIAGNOSIS — R197 Diarrhea, unspecified: Secondary | ICD-10-CM | POA: Diagnosis not present

## 2016-07-01 DIAGNOSIS — Z9981 Dependence on supplemental oxygen: Secondary | ICD-10-CM | POA: Diagnosis not present

## 2016-07-01 DIAGNOSIS — Z743 Need for continuous supervision: Secondary | ICD-10-CM | POA: Diagnosis not present

## 2016-07-01 DIAGNOSIS — R0602 Shortness of breath: Secondary | ICD-10-CM | POA: Diagnosis not present

## 2016-07-03 DIAGNOSIS — I5032 Chronic diastolic (congestive) heart failure: Secondary | ICD-10-CM | POA: Diagnosis not present

## 2016-07-03 DIAGNOSIS — I482 Chronic atrial fibrillation: Secondary | ICD-10-CM | POA: Diagnosis not present

## 2016-07-03 DIAGNOSIS — N390 Urinary tract infection, site not specified: Secondary | ICD-10-CM | POA: Diagnosis not present

## 2016-07-03 DIAGNOSIS — I10 Essential (primary) hypertension: Secondary | ICD-10-CM | POA: Diagnosis not present

## 2016-07-05 DIAGNOSIS — Z7901 Long term (current) use of anticoagulants: Secondary | ICD-10-CM | POA: Diagnosis not present

## 2016-07-07 DIAGNOSIS — R079 Chest pain, unspecified: Secondary | ICD-10-CM | POA: Diagnosis not present

## 2016-07-07 DIAGNOSIS — R531 Weakness: Secondary | ICD-10-CM | POA: Diagnosis not present

## 2016-07-07 DIAGNOSIS — R112 Nausea with vomiting, unspecified: Secondary | ICD-10-CM | POA: Diagnosis not present

## 2016-07-12 DIAGNOSIS — Z7901 Long term (current) use of anticoagulants: Secondary | ICD-10-CM | POA: Diagnosis not present

## 2016-07-17 DIAGNOSIS — I5032 Chronic diastolic (congestive) heart failure: Secondary | ICD-10-CM | POA: Diagnosis not present

## 2016-07-17 DIAGNOSIS — I10 Essential (primary) hypertension: Secondary | ICD-10-CM | POA: Diagnosis not present

## 2016-07-17 DIAGNOSIS — I482 Chronic atrial fibrillation: Secondary | ICD-10-CM | POA: Diagnosis not present

## 2016-07-17 DIAGNOSIS — K589 Irritable bowel syndrome without diarrhea: Secondary | ICD-10-CM | POA: Diagnosis not present

## 2016-07-18 DIAGNOSIS — I5032 Chronic diastolic (congestive) heart failure: Secondary | ICD-10-CM | POA: Diagnosis not present

## 2016-07-19 DIAGNOSIS — Z7901 Long term (current) use of anticoagulants: Secondary | ICD-10-CM | POA: Diagnosis not present

## 2016-07-20 DIAGNOSIS — L602 Onychogryphosis: Secondary | ICD-10-CM | POA: Diagnosis not present

## 2016-07-20 DIAGNOSIS — L84 Corns and callosities: Secondary | ICD-10-CM | POA: Diagnosis not present

## 2016-07-25 DIAGNOSIS — S8002XA Contusion of left knee, initial encounter: Secondary | ICD-10-CM | POA: Diagnosis not present

## 2016-07-25 DIAGNOSIS — R259 Unspecified abnormal involuntary movements: Secondary | ICD-10-CM | POA: Diagnosis not present

## 2016-07-25 DIAGNOSIS — S8992XA Unspecified injury of left lower leg, initial encounter: Secondary | ICD-10-CM | POA: Diagnosis not present

## 2016-07-25 DIAGNOSIS — M25569 Pain in unspecified knee: Secondary | ICD-10-CM | POA: Diagnosis not present

## 2016-07-25 DIAGNOSIS — M25562 Pain in left knee: Secondary | ICD-10-CM | POA: Diagnosis not present

## 2016-07-26 DIAGNOSIS — Z7901 Long term (current) use of anticoagulants: Secondary | ICD-10-CM | POA: Diagnosis not present

## 2016-07-26 DIAGNOSIS — J189 Pneumonia, unspecified organism: Secondary | ICD-10-CM | POA: Diagnosis not present

## 2016-07-26 DIAGNOSIS — R2681 Unsteadiness on feet: Secondary | ICD-10-CM | POA: Diagnosis not present

## 2016-07-31 DIAGNOSIS — R296 Repeated falls: Secondary | ICD-10-CM | POA: Diagnosis not present

## 2016-07-31 DIAGNOSIS — S80212S Abrasion, left knee, sequela: Secondary | ICD-10-CM | POA: Diagnosis not present

## 2016-08-02 DIAGNOSIS — Z7901 Long term (current) use of anticoagulants: Secondary | ICD-10-CM | POA: Diagnosis not present

## 2016-08-05 DIAGNOSIS — I482 Chronic atrial fibrillation: Secondary | ICD-10-CM | POA: Diagnosis not present

## 2016-08-05 DIAGNOSIS — E785 Hyperlipidemia, unspecified: Secondary | ICD-10-CM | POA: Diagnosis not present

## 2016-08-05 DIAGNOSIS — K219 Gastro-esophageal reflux disease without esophagitis: Secondary | ICD-10-CM | POA: Diagnosis not present

## 2016-08-05 DIAGNOSIS — I10 Essential (primary) hypertension: Secondary | ICD-10-CM | POA: Diagnosis not present

## 2016-08-09 DIAGNOSIS — Z7901 Long term (current) use of anticoagulants: Secondary | ICD-10-CM | POA: Diagnosis not present

## 2016-08-15 DIAGNOSIS — I5032 Chronic diastolic (congestive) heart failure: Secondary | ICD-10-CM | POA: Diagnosis not present

## 2016-08-15 DIAGNOSIS — R5381 Other malaise: Secondary | ICD-10-CM | POA: Diagnosis not present

## 2016-08-15 DIAGNOSIS — I482 Chronic atrial fibrillation: Secondary | ICD-10-CM | POA: Diagnosis not present

## 2016-08-15 DIAGNOSIS — K219 Gastro-esophageal reflux disease without esophagitis: Secondary | ICD-10-CM | POA: Diagnosis not present

## 2016-08-16 DIAGNOSIS — Z7901 Long term (current) use of anticoagulants: Secondary | ICD-10-CM | POA: Diagnosis not present

## 2016-08-21 DIAGNOSIS — Z7901 Long term (current) use of anticoagulants: Secondary | ICD-10-CM | POA: Diagnosis not present

## 2016-08-24 DIAGNOSIS — J189 Pneumonia, unspecified organism: Secondary | ICD-10-CM | POA: Diagnosis not present

## 2016-08-24 DIAGNOSIS — R2681 Unsteadiness on feet: Secondary | ICD-10-CM | POA: Diagnosis not present

## 2016-08-28 DIAGNOSIS — Z7901 Long term (current) use of anticoagulants: Secondary | ICD-10-CM | POA: Diagnosis not present

## 2016-08-31 DIAGNOSIS — Z7901 Long term (current) use of anticoagulants: Secondary | ICD-10-CM | POA: Diagnosis not present

## 2016-09-06 DIAGNOSIS — Z7901 Long term (current) use of anticoagulants: Secondary | ICD-10-CM | POA: Diagnosis not present

## 2016-09-11 DIAGNOSIS — S39012A Strain of muscle, fascia and tendon of lower back, initial encounter: Secondary | ICD-10-CM | POA: Diagnosis not present

## 2016-09-11 DIAGNOSIS — I5032 Chronic diastolic (congestive) heart failure: Secondary | ICD-10-CM | POA: Diagnosis not present

## 2016-09-11 DIAGNOSIS — R531 Weakness: Secondary | ICD-10-CM | POA: Diagnosis not present

## 2016-09-11 DIAGNOSIS — I482 Chronic atrial fibrillation: Secondary | ICD-10-CM | POA: Diagnosis not present

## 2016-09-11 DIAGNOSIS — Z7901 Long term (current) use of anticoagulants: Secondary | ICD-10-CM | POA: Diagnosis not present

## 2016-09-11 DIAGNOSIS — S199XXA Unspecified injury of neck, initial encounter: Secondary | ICD-10-CM | POA: Diagnosis not present

## 2016-09-11 DIAGNOSIS — S0990XA Unspecified injury of head, initial encounter: Secondary | ICD-10-CM | POA: Diagnosis not present

## 2016-09-11 DIAGNOSIS — M545 Low back pain: Secondary | ICD-10-CM | POA: Diagnosis not present

## 2016-09-11 DIAGNOSIS — Z743 Need for continuous supervision: Secondary | ICD-10-CM | POA: Diagnosis not present

## 2016-09-11 DIAGNOSIS — Z9981 Dependence on supplemental oxygen: Secondary | ICD-10-CM | POA: Diagnosis not present

## 2016-09-11 DIAGNOSIS — K219 Gastro-esophageal reflux disease without esophagitis: Secondary | ICD-10-CM | POA: Diagnosis not present

## 2016-09-11 DIAGNOSIS — R5381 Other malaise: Secondary | ICD-10-CM | POA: Diagnosis not present

## 2016-09-13 DIAGNOSIS — Z7901 Long term (current) use of anticoagulants: Secondary | ICD-10-CM | POA: Diagnosis not present

## 2016-09-15 DIAGNOSIS — I5032 Chronic diastolic (congestive) heart failure: Secondary | ICD-10-CM | POA: Diagnosis not present

## 2016-09-18 DIAGNOSIS — R296 Repeated falls: Secondary | ICD-10-CM | POA: Diagnosis not present

## 2016-09-18 DIAGNOSIS — S0990XA Unspecified injury of head, initial encounter: Secondary | ICD-10-CM | POA: Diagnosis not present

## 2016-09-18 DIAGNOSIS — S0003XA Contusion of scalp, initial encounter: Secondary | ICD-10-CM | POA: Diagnosis not present

## 2016-09-18 DIAGNOSIS — R5381 Other malaise: Secondary | ICD-10-CM | POA: Diagnosis not present

## 2016-09-20 DIAGNOSIS — E559 Vitamin D deficiency, unspecified: Secondary | ICD-10-CM | POA: Diagnosis not present

## 2016-09-20 DIAGNOSIS — E038 Other specified hypothyroidism: Secondary | ICD-10-CM | POA: Diagnosis not present

## 2016-09-20 DIAGNOSIS — E119 Type 2 diabetes mellitus without complications: Secondary | ICD-10-CM | POA: Diagnosis not present

## 2016-09-20 DIAGNOSIS — Z79899 Other long term (current) drug therapy: Secondary | ICD-10-CM | POA: Diagnosis not present

## 2016-09-20 DIAGNOSIS — D518 Other vitamin B12 deficiency anemias: Secondary | ICD-10-CM | POA: Diagnosis not present

## 2016-09-20 DIAGNOSIS — E782 Mixed hyperlipidemia: Secondary | ICD-10-CM | POA: Diagnosis not present

## 2016-09-24 DIAGNOSIS — R404 Transient alteration of awareness: Secondary | ICD-10-CM | POA: Diagnosis not present

## 2016-09-24 DIAGNOSIS — J189 Pneumonia, unspecified organism: Secondary | ICD-10-CM | POA: Diagnosis not present

## 2016-09-24 DIAGNOSIS — R55 Syncope and collapse: Secondary | ICD-10-CM | POA: Diagnosis not present

## 2016-09-24 DIAGNOSIS — N3000 Acute cystitis without hematuria: Secondary | ICD-10-CM | POA: Diagnosis not present

## 2016-09-24 DIAGNOSIS — R2681 Unsteadiness on feet: Secondary | ICD-10-CM | POA: Diagnosis not present

## 2016-09-25 DIAGNOSIS — E559 Vitamin D deficiency, unspecified: Secondary | ICD-10-CM | POA: Diagnosis not present

## 2016-09-25 DIAGNOSIS — N390 Urinary tract infection, site not specified: Secondary | ICD-10-CM | POA: Diagnosis not present

## 2016-09-27 DIAGNOSIS — Z7901 Long term (current) use of anticoagulants: Secondary | ICD-10-CM | POA: Diagnosis not present

## 2016-09-28 DIAGNOSIS — L84 Corns and callosities: Secondary | ICD-10-CM | POA: Diagnosis not present

## 2016-09-28 DIAGNOSIS — B351 Tinea unguium: Secondary | ICD-10-CM | POA: Diagnosis not present

## 2016-09-28 DIAGNOSIS — I739 Peripheral vascular disease, unspecified: Secondary | ICD-10-CM | POA: Diagnosis not present

## 2016-10-02 DIAGNOSIS — N390 Urinary tract infection, site not specified: Secondary | ICD-10-CM | POA: Diagnosis not present

## 2016-10-04 DIAGNOSIS — Z7901 Long term (current) use of anticoagulants: Secondary | ICD-10-CM | POA: Diagnosis not present

## 2016-10-08 DIAGNOSIS — Z880 Allergy status to penicillin: Secondary | ICD-10-CM | POA: Diagnosis not present

## 2016-10-08 DIAGNOSIS — M79604 Pain in right leg: Secondary | ICD-10-CM | POA: Diagnosis not present

## 2016-10-08 DIAGNOSIS — Z66 Do not resuscitate: Secondary | ICD-10-CM | POA: Diagnosis not present

## 2016-10-08 DIAGNOSIS — S0003XA Contusion of scalp, initial encounter: Secondary | ICD-10-CM | POA: Diagnosis not present

## 2016-10-08 DIAGNOSIS — M25462 Effusion, left knee: Secondary | ICD-10-CM | POA: Diagnosis not present

## 2016-10-08 DIAGNOSIS — M25552 Pain in left hip: Secondary | ICD-10-CM | POA: Diagnosis not present

## 2016-10-08 DIAGNOSIS — I509 Heart failure, unspecified: Secondary | ICD-10-CM | POA: Diagnosis not present

## 2016-10-08 DIAGNOSIS — S3993XA Unspecified injury of pelvis, initial encounter: Secondary | ICD-10-CM | POA: Diagnosis not present

## 2016-10-08 DIAGNOSIS — S199XXA Unspecified injury of neck, initial encounter: Secondary | ICD-10-CM | POA: Diagnosis not present

## 2016-10-08 DIAGNOSIS — S0990XA Unspecified injury of head, initial encounter: Secondary | ICD-10-CM | POA: Diagnosis not present

## 2016-10-08 DIAGNOSIS — Z9981 Dependence on supplemental oxygen: Secondary | ICD-10-CM | POA: Diagnosis not present

## 2016-10-08 DIAGNOSIS — G319 Degenerative disease of nervous system, unspecified: Secondary | ICD-10-CM | POA: Diagnosis not present

## 2016-10-08 DIAGNOSIS — M79605 Pain in left leg: Secondary | ICD-10-CM | POA: Diagnosis not present

## 2016-10-08 DIAGNOSIS — Z882 Allergy status to sulfonamides status: Secondary | ICD-10-CM | POA: Diagnosis not present

## 2016-10-08 DIAGNOSIS — S0191XA Laceration without foreign body of unspecified part of head, initial encounter: Secondary | ICD-10-CM | POA: Diagnosis not present

## 2016-10-08 DIAGNOSIS — S0232XA Fracture of orbital floor, left side, initial encounter for closed fracture: Secondary | ICD-10-CM | POA: Diagnosis not present

## 2016-10-08 DIAGNOSIS — S8002XA Contusion of left knee, initial encounter: Secondary | ICD-10-CM | POA: Diagnosis not present

## 2016-10-08 DIAGNOSIS — T148XXA Other injury of unspecified body region, initial encounter: Secondary | ICD-10-CM | POA: Diagnosis not present

## 2016-10-09 DIAGNOSIS — M25562 Pain in left knee: Secondary | ICD-10-CM | POA: Diagnosis not present

## 2016-10-09 DIAGNOSIS — M25561 Pain in right knee: Secondary | ICD-10-CM | POA: Diagnosis not present

## 2016-10-09 DIAGNOSIS — I482 Chronic atrial fibrillation: Secondary | ICD-10-CM | POA: Diagnosis not present

## 2016-10-09 DIAGNOSIS — I10 Essential (primary) hypertension: Secondary | ICD-10-CM | POA: Diagnosis not present

## 2016-10-15 DIAGNOSIS — I5032 Chronic diastolic (congestive) heart failure: Secondary | ICD-10-CM | POA: Diagnosis not present

## 2016-10-19 DIAGNOSIS — Z7901 Long term (current) use of anticoagulants: Secondary | ICD-10-CM | POA: Diagnosis not present

## 2016-10-20 DIAGNOSIS — Z7901 Long term (current) use of anticoagulants: Secondary | ICD-10-CM | POA: Diagnosis not present

## 2016-10-24 DIAGNOSIS — J189 Pneumonia, unspecified organism: Secondary | ICD-10-CM | POA: Diagnosis not present

## 2016-10-24 DIAGNOSIS — R2681 Unsteadiness on feet: Secondary | ICD-10-CM | POA: Diagnosis not present

## 2016-10-25 DIAGNOSIS — Z7901 Long term (current) use of anticoagulants: Secondary | ICD-10-CM | POA: Diagnosis not present

## 2016-11-06 DIAGNOSIS — I482 Chronic atrial fibrillation: Secondary | ICD-10-CM | POA: Diagnosis not present

## 2016-11-06 DIAGNOSIS — K219 Gastro-esophageal reflux disease without esophagitis: Secondary | ICD-10-CM | POA: Diagnosis not present

## 2016-11-06 DIAGNOSIS — I10 Essential (primary) hypertension: Secondary | ICD-10-CM | POA: Diagnosis not present

## 2016-11-06 DIAGNOSIS — I5032 Chronic diastolic (congestive) heart failure: Secondary | ICD-10-CM | POA: Diagnosis not present

## 2016-11-15 DIAGNOSIS — I5032 Chronic diastolic (congestive) heart failure: Secondary | ICD-10-CM | POA: Diagnosis not present

## 2016-11-24 DIAGNOSIS — Z79899 Other long term (current) drug therapy: Secondary | ICD-10-CM | POA: Diagnosis not present

## 2016-11-24 DIAGNOSIS — D518 Other vitamin B12 deficiency anemias: Secondary | ICD-10-CM | POA: Diagnosis not present

## 2016-11-24 DIAGNOSIS — J189 Pneumonia, unspecified organism: Secondary | ICD-10-CM | POA: Diagnosis not present

## 2016-11-24 DIAGNOSIS — R2681 Unsteadiness on feet: Secondary | ICD-10-CM | POA: Diagnosis not present

## 2016-11-24 DIAGNOSIS — Z7901 Long term (current) use of anticoagulants: Secondary | ICD-10-CM | POA: Diagnosis not present

## 2016-11-24 DIAGNOSIS — E119 Type 2 diabetes mellitus without complications: Secondary | ICD-10-CM | POA: Diagnosis not present

## 2016-11-24 DIAGNOSIS — E782 Mixed hyperlipidemia: Secondary | ICD-10-CM | POA: Diagnosis not present

## 2016-11-27 DIAGNOSIS — I482 Chronic atrial fibrillation: Secondary | ICD-10-CM | POA: Diagnosis not present

## 2016-11-27 DIAGNOSIS — I10 Essential (primary) hypertension: Secondary | ICD-10-CM | POA: Diagnosis not present

## 2016-11-27 DIAGNOSIS — I5032 Chronic diastolic (congestive) heart failure: Secondary | ICD-10-CM | POA: Diagnosis not present

## 2016-11-30 DIAGNOSIS — Z79899 Other long term (current) drug therapy: Secondary | ICD-10-CM | POA: Diagnosis not present

## 2016-12-15 DIAGNOSIS — I5032 Chronic diastolic (congestive) heart failure: Secondary | ICD-10-CM | POA: Diagnosis not present

## 2016-12-21 DIAGNOSIS — E559 Vitamin D deficiency, unspecified: Secondary | ICD-10-CM | POA: Diagnosis not present

## 2016-12-21 DIAGNOSIS — E119 Type 2 diabetes mellitus without complications: Secondary | ICD-10-CM | POA: Diagnosis not present

## 2016-12-21 DIAGNOSIS — E038 Other specified hypothyroidism: Secondary | ICD-10-CM | POA: Diagnosis not present

## 2016-12-21 DIAGNOSIS — E782 Mixed hyperlipidemia: Secondary | ICD-10-CM | POA: Diagnosis not present

## 2016-12-21 DIAGNOSIS — Z79899 Other long term (current) drug therapy: Secondary | ICD-10-CM | POA: Diagnosis not present

## 2016-12-21 DIAGNOSIS — D518 Other vitamin B12 deficiency anemias: Secondary | ICD-10-CM | POA: Diagnosis not present

## 2016-12-24 DIAGNOSIS — J189 Pneumonia, unspecified organism: Secondary | ICD-10-CM | POA: Diagnosis not present

## 2016-12-24 DIAGNOSIS — R2681 Unsteadiness on feet: Secondary | ICD-10-CM | POA: Diagnosis not present

## 2017-01-01 DIAGNOSIS — I5032 Chronic diastolic (congestive) heart failure: Secondary | ICD-10-CM | POA: Diagnosis not present

## 2017-01-01 DIAGNOSIS — I10 Essential (primary) hypertension: Secondary | ICD-10-CM | POA: Diagnosis not present

## 2017-01-01 DIAGNOSIS — K219 Gastro-esophageal reflux disease without esophagitis: Secondary | ICD-10-CM | POA: Diagnosis not present

## 2017-01-01 DIAGNOSIS — I482 Chronic atrial fibrillation: Secondary | ICD-10-CM | POA: Diagnosis not present

## 2017-01-08 DIAGNOSIS — S0093XA Contusion of unspecified part of head, initial encounter: Secondary | ICD-10-CM | POA: Diagnosis not present

## 2017-01-08 DIAGNOSIS — G8911 Acute pain due to trauma: Secondary | ICD-10-CM | POA: Diagnosis not present

## 2017-01-08 DIAGNOSIS — Z880 Allergy status to penicillin: Secondary | ICD-10-CM | POA: Diagnosis not present

## 2017-01-08 DIAGNOSIS — Z780 Asymptomatic menopausal state: Secondary | ICD-10-CM | POA: Diagnosis not present

## 2017-01-08 DIAGNOSIS — S199XXA Unspecified injury of neck, initial encounter: Secondary | ICD-10-CM | POA: Diagnosis not present

## 2017-01-08 DIAGNOSIS — G319 Degenerative disease of nervous system, unspecified: Secondary | ICD-10-CM | POA: Diagnosis not present

## 2017-01-08 DIAGNOSIS — S8002XA Contusion of left knee, initial encounter: Secondary | ICD-10-CM | POA: Diagnosis not present

## 2017-01-08 DIAGNOSIS — S0990XA Unspecified injury of head, initial encounter: Secondary | ICD-10-CM | POA: Diagnosis not present

## 2017-01-08 DIAGNOSIS — I509 Heart failure, unspecified: Secondary | ICD-10-CM | POA: Diagnosis not present

## 2017-01-08 DIAGNOSIS — M25562 Pain in left knee: Secondary | ICD-10-CM | POA: Diagnosis not present

## 2017-01-08 DIAGNOSIS — M25569 Pain in unspecified knee: Secondary | ICD-10-CM | POA: Diagnosis not present

## 2017-01-08 DIAGNOSIS — M25462 Effusion, left knee: Secondary | ICD-10-CM | POA: Diagnosis not present

## 2017-01-08 DIAGNOSIS — Z882 Allergy status to sulfonamides status: Secondary | ICD-10-CM | POA: Diagnosis not present

## 2017-01-08 DIAGNOSIS — Z8673 Personal history of transient ischemic attack (TIA), and cerebral infarction without residual deficits: Secondary | ICD-10-CM | POA: Diagnosis not present

## 2017-01-08 DIAGNOSIS — T148XXA Other injury of unspecified body region, initial encounter: Secondary | ICD-10-CM | POA: Diagnosis not present

## 2017-01-16 DIAGNOSIS — N390 Urinary tract infection, site not specified: Secondary | ICD-10-CM | POA: Diagnosis not present

## 2017-01-24 DIAGNOSIS — R2681 Unsteadiness on feet: Secondary | ICD-10-CM | POA: Diagnosis not present

## 2017-01-24 DIAGNOSIS — J189 Pneumonia, unspecified organism: Secondary | ICD-10-CM | POA: Diagnosis not present

## 2017-02-01 DIAGNOSIS — Z7901 Long term (current) use of anticoagulants: Secondary | ICD-10-CM | POA: Diagnosis not present

## 2017-02-05 DIAGNOSIS — N390 Urinary tract infection, site not specified: Secondary | ICD-10-CM | POA: Diagnosis not present

## 2017-02-12 DIAGNOSIS — I5032 Chronic diastolic (congestive) heart failure: Secondary | ICD-10-CM | POA: Diagnosis not present

## 2017-02-12 DIAGNOSIS — I10 Essential (primary) hypertension: Secondary | ICD-10-CM | POA: Diagnosis not present

## 2017-02-12 DIAGNOSIS — I482 Chronic atrial fibrillation: Secondary | ICD-10-CM | POA: Diagnosis not present

## 2017-02-19 DIAGNOSIS — I739 Peripheral vascular disease, unspecified: Secondary | ICD-10-CM | POA: Diagnosis not present

## 2017-02-19 DIAGNOSIS — R0989 Other specified symptoms and signs involving the circulatory and respiratory systems: Secondary | ICD-10-CM | POA: Diagnosis not present

## 2017-02-20 DIAGNOSIS — R011 Cardiac murmur, unspecified: Secondary | ICD-10-CM | POA: Diagnosis not present

## 2017-02-21 DIAGNOSIS — R221 Localized swelling, mass and lump, neck: Secondary | ICD-10-CM | POA: Diagnosis not present

## 2017-02-21 DIAGNOSIS — M79609 Pain in unspecified limb: Secondary | ICD-10-CM | POA: Diagnosis not present

## 2017-02-24 DIAGNOSIS — J189 Pneumonia, unspecified organism: Secondary | ICD-10-CM | POA: Diagnosis not present

## 2017-02-24 DIAGNOSIS — R2681 Unsteadiness on feet: Secondary | ICD-10-CM | POA: Diagnosis not present

## 2017-03-06 DIAGNOSIS — N390 Urinary tract infection, site not specified: Secondary | ICD-10-CM | POA: Diagnosis not present

## 2017-03-07 DIAGNOSIS — Z7901 Long term (current) use of anticoagulants: Secondary | ICD-10-CM | POA: Diagnosis not present

## 2017-03-08 DIAGNOSIS — Z7901 Long term (current) use of anticoagulants: Secondary | ICD-10-CM | POA: Diagnosis not present

## 2017-03-09 DIAGNOSIS — Z7901 Long term (current) use of anticoagulants: Secondary | ICD-10-CM | POA: Diagnosis not present

## 2017-03-12 DIAGNOSIS — I5032 Chronic diastolic (congestive) heart failure: Secondary | ICD-10-CM | POA: Diagnosis not present

## 2017-03-12 DIAGNOSIS — I482 Chronic atrial fibrillation: Secondary | ICD-10-CM | POA: Diagnosis not present

## 2017-03-12 DIAGNOSIS — I739 Peripheral vascular disease, unspecified: Secondary | ICD-10-CM | POA: Diagnosis not present

## 2017-03-12 DIAGNOSIS — I10 Essential (primary) hypertension: Secondary | ICD-10-CM | POA: Diagnosis not present

## 2017-03-19 DIAGNOSIS — Z7901 Long term (current) use of anticoagulants: Secondary | ICD-10-CM | POA: Diagnosis not present

## 2017-03-22 DIAGNOSIS — E119 Type 2 diabetes mellitus without complications: Secondary | ICD-10-CM | POA: Diagnosis not present

## 2017-03-22 DIAGNOSIS — D518 Other vitamin B12 deficiency anemias: Secondary | ICD-10-CM | POA: Diagnosis not present

## 2017-03-22 DIAGNOSIS — Z79899 Other long term (current) drug therapy: Secondary | ICD-10-CM | POA: Diagnosis not present

## 2017-03-22 DIAGNOSIS — E7849 Other hyperlipidemia: Secondary | ICD-10-CM | POA: Diagnosis not present

## 2017-03-22 DIAGNOSIS — E559 Vitamin D deficiency, unspecified: Secondary | ICD-10-CM | POA: Diagnosis not present

## 2017-03-22 DIAGNOSIS — E038 Other specified hypothyroidism: Secondary | ICD-10-CM | POA: Diagnosis not present

## 2017-03-26 DIAGNOSIS — I482 Chronic atrial fibrillation: Secondary | ICD-10-CM | POA: Diagnosis not present

## 2017-03-26 DIAGNOSIS — Z7901 Long term (current) use of anticoagulants: Secondary | ICD-10-CM | POA: Diagnosis not present

## 2017-03-26 DIAGNOSIS — B029 Zoster without complications: Secondary | ICD-10-CM | POA: Diagnosis not present

## 2017-04-14 DIAGNOSIS — Z0000 Encounter for general adult medical examination without abnormal findings: Secondary | ICD-10-CM | POA: Diagnosis not present

## 2017-04-14 DIAGNOSIS — K219 Gastro-esophageal reflux disease without esophagitis: Secondary | ICD-10-CM | POA: Diagnosis not present

## 2017-04-14 DIAGNOSIS — E785 Hyperlipidemia, unspecified: Secondary | ICD-10-CM | POA: Diagnosis not present

## 2017-04-14 DIAGNOSIS — E559 Vitamin D deficiency, unspecified: Secondary | ICD-10-CM | POA: Diagnosis not present

## 2017-04-14 DIAGNOSIS — I10 Essential (primary) hypertension: Secondary | ICD-10-CM | POA: Diagnosis not present

## 2017-05-10 DIAGNOSIS — Z7901 Long term (current) use of anticoagulants: Secondary | ICD-10-CM | POA: Diagnosis not present

## 2017-05-11 DIAGNOSIS — I10 Essential (primary) hypertension: Secondary | ICD-10-CM | POA: Diagnosis not present

## 2017-05-11 DIAGNOSIS — I5032 Chronic diastolic (congestive) heart failure: Secondary | ICD-10-CM | POA: Diagnosis not present

## 2017-05-11 DIAGNOSIS — I482 Chronic atrial fibrillation: Secondary | ICD-10-CM | POA: Diagnosis not present

## 2017-05-24 DIAGNOSIS — Z7901 Long term (current) use of anticoagulants: Secondary | ICD-10-CM | POA: Diagnosis not present

## 2017-05-25 DIAGNOSIS — Z7901 Long term (current) use of anticoagulants: Secondary | ICD-10-CM | POA: Diagnosis not present

## 2017-05-28 DIAGNOSIS — B351 Tinea unguium: Secondary | ICD-10-CM | POA: Diagnosis not present

## 2017-05-28 DIAGNOSIS — I739 Peripheral vascular disease, unspecified: Secondary | ICD-10-CM | POA: Diagnosis not present

## 2017-05-28 DIAGNOSIS — L84 Corns and callosities: Secondary | ICD-10-CM | POA: Diagnosis not present

## 2017-06-01 DIAGNOSIS — I10 Essential (primary) hypertension: Secondary | ICD-10-CM | POA: Diagnosis not present

## 2017-06-01 DIAGNOSIS — Z7901 Long term (current) use of anticoagulants: Secondary | ICD-10-CM | POA: Diagnosis not present

## 2017-06-01 DIAGNOSIS — R296 Repeated falls: Secondary | ICD-10-CM | POA: Diagnosis not present

## 2017-06-01 DIAGNOSIS — I482 Chronic atrial fibrillation: Secondary | ICD-10-CM | POA: Diagnosis not present

## 2017-06-01 DIAGNOSIS — I5032 Chronic diastolic (congestive) heart failure: Secondary | ICD-10-CM | POA: Diagnosis not present

## 2017-06-04 DIAGNOSIS — R05 Cough: Secondary | ICD-10-CM | POA: Diagnosis not present

## 2017-06-07 DIAGNOSIS — Z880 Allergy status to penicillin: Secondary | ICD-10-CM | POA: Diagnosis not present

## 2017-06-07 DIAGNOSIS — Z881 Allergy status to other antibiotic agents status: Secondary | ICD-10-CM | POA: Diagnosis not present

## 2017-06-07 DIAGNOSIS — R1110 Vomiting, unspecified: Secondary | ICD-10-CM | POA: Diagnosis not present

## 2017-06-07 DIAGNOSIS — Z8673 Personal history of transient ischemic attack (TIA), and cerebral infarction without residual deficits: Secondary | ICD-10-CM | POA: Diagnosis not present

## 2017-06-07 DIAGNOSIS — A419 Sepsis, unspecified organism: Secondary | ICD-10-CM | POA: Diagnosis not present

## 2017-06-07 DIAGNOSIS — I110 Hypertensive heart disease with heart failure: Secondary | ICD-10-CM | POA: Diagnosis not present

## 2017-06-07 DIAGNOSIS — R000 Tachycardia, unspecified: Secondary | ICD-10-CM | POA: Diagnosis not present

## 2017-06-07 DIAGNOSIS — R5081 Fever presenting with conditions classified elsewhere: Secondary | ICD-10-CM | POA: Diagnosis not present

## 2017-06-07 DIAGNOSIS — I5040 Unspecified combined systolic (congestive) and diastolic (congestive) heart failure: Secondary | ICD-10-CM | POA: Diagnosis not present

## 2017-06-07 DIAGNOSIS — E785 Hyperlipidemia, unspecified: Secondary | ICD-10-CM | POA: Diagnosis not present

## 2017-06-07 DIAGNOSIS — J40 Bronchitis, not specified as acute or chronic: Secondary | ICD-10-CM | POA: Diagnosis not present

## 2017-06-07 DIAGNOSIS — Z882 Allergy status to sulfonamides status: Secondary | ICD-10-CM | POA: Diagnosis not present

## 2017-06-07 DIAGNOSIS — R791 Abnormal coagulation profile: Secondary | ICD-10-CM | POA: Diagnosis not present

## 2017-06-07 DIAGNOSIS — M1990 Unspecified osteoarthritis, unspecified site: Secondary | ICD-10-CM | POA: Diagnosis not present

## 2017-06-07 DIAGNOSIS — J189 Pneumonia, unspecified organism: Secondary | ICD-10-CM | POA: Diagnosis not present

## 2017-06-07 DIAGNOSIS — E7800 Pure hypercholesterolemia, unspecified: Secondary | ICD-10-CM | POA: Diagnosis not present

## 2017-06-07 DIAGNOSIS — I5032 Chronic diastolic (congestive) heart failure: Secondary | ICD-10-CM | POA: Diagnosis not present

## 2017-06-07 DIAGNOSIS — I4891 Unspecified atrial fibrillation: Secondary | ICD-10-CM | POA: Diagnosis not present

## 2017-06-07 DIAGNOSIS — I10 Essential (primary) hypertension: Secondary | ICD-10-CM | POA: Diagnosis not present

## 2017-06-07 DIAGNOSIS — R062 Wheezing: Secondary | ICD-10-CM | POA: Diagnosis not present

## 2017-06-07 DIAGNOSIS — Z66 Do not resuscitate: Secondary | ICD-10-CM | POA: Diagnosis not present

## 2017-06-07 DIAGNOSIS — R0989 Other specified symptoms and signs involving the circulatory and respiratory systems: Secondary | ICD-10-CM | POA: Diagnosis not present

## 2017-06-07 DIAGNOSIS — I482 Chronic atrial fibrillation: Secondary | ICD-10-CM | POA: Diagnosis not present

## 2017-06-07 DIAGNOSIS — R7989 Other specified abnormal findings of blood chemistry: Secondary | ICD-10-CM | POA: Diagnosis not present

## 2017-06-07 DIAGNOSIS — K219 Gastro-esophageal reflux disease without esophagitis: Secondary | ICD-10-CM | POA: Diagnosis not present

## 2017-06-07 DIAGNOSIS — I351 Nonrheumatic aortic (valve) insufficiency: Secondary | ICD-10-CM | POA: Diagnosis not present

## 2017-06-07 DIAGNOSIS — R0602 Shortness of breath: Secondary | ICD-10-CM | POA: Diagnosis not present

## 2017-06-07 DIAGNOSIS — R509 Fever, unspecified: Secondary | ICD-10-CM | POA: Diagnosis not present

## 2017-06-07 DIAGNOSIS — R05 Cough: Secondary | ICD-10-CM | POA: Diagnosis not present

## 2017-06-07 DIAGNOSIS — I517 Cardiomegaly: Secondary | ICD-10-CM | POA: Diagnosis not present

## 2017-06-07 DIAGNOSIS — I361 Nonrheumatic tricuspid (valve) insufficiency: Secondary | ICD-10-CM | POA: Diagnosis not present

## 2017-06-07 DIAGNOSIS — J9601 Acute respiratory failure with hypoxia: Secondary | ICD-10-CM | POA: Diagnosis not present

## 2017-06-07 DIAGNOSIS — Z789 Other specified health status: Secondary | ICD-10-CM | POA: Diagnosis not present

## 2017-06-07 DIAGNOSIS — R197 Diarrhea, unspecified: Secondary | ICD-10-CM | POA: Diagnosis not present

## 2017-06-07 DIAGNOSIS — A0839 Other viral enteritis: Secondary | ICD-10-CM | POA: Diagnosis not present

## 2017-06-07 DIAGNOSIS — Z7901 Long term (current) use of anticoagulants: Secondary | ICD-10-CM | POA: Diagnosis not present

## 2017-06-07 DIAGNOSIS — R0902 Hypoxemia: Secondary | ICD-10-CM | POA: Diagnosis not present

## 2017-06-07 DIAGNOSIS — I340 Nonrheumatic mitral (valve) insufficiency: Secondary | ICD-10-CM | POA: Diagnosis not present

## 2017-06-07 DIAGNOSIS — I480 Paroxysmal atrial fibrillation: Secondary | ICD-10-CM | POA: Diagnosis not present

## 2017-06-07 DIAGNOSIS — R079 Chest pain, unspecified: Secondary | ICD-10-CM | POA: Diagnosis not present

## 2017-06-07 DIAGNOSIS — I700 Atherosclerosis of aorta: Secondary | ICD-10-CM | POA: Diagnosis not present

## 2017-06-21 DIAGNOSIS — Z7901 Long term (current) use of anticoagulants: Secondary | ICD-10-CM | POA: Diagnosis not present

## 2017-06-22 DIAGNOSIS — J129 Viral pneumonia, unspecified: Secondary | ICD-10-CM | POA: Diagnosis not present

## 2017-06-22 DIAGNOSIS — I482 Chronic atrial fibrillation: Secondary | ICD-10-CM | POA: Diagnosis not present

## 2017-06-22 DIAGNOSIS — I5032 Chronic diastolic (congestive) heart failure: Secondary | ICD-10-CM | POA: Diagnosis not present

## 2017-06-25 DIAGNOSIS — M6281 Muscle weakness (generalized): Secondary | ICD-10-CM | POA: Diagnosis not present

## 2017-06-25 DIAGNOSIS — Z9181 History of falling: Secondary | ICD-10-CM | POA: Diagnosis not present

## 2017-06-25 DIAGNOSIS — Z8673 Personal history of transient ischemic attack (TIA), and cerebral infarction without residual deficits: Secondary | ICD-10-CM | POA: Diagnosis not present

## 2017-06-25 DIAGNOSIS — Z7901 Long term (current) use of anticoagulants: Secondary | ICD-10-CM | POA: Diagnosis not present

## 2017-06-25 DIAGNOSIS — I509 Heart failure, unspecified: Secondary | ICD-10-CM | POA: Diagnosis not present

## 2017-06-25 DIAGNOSIS — E785 Hyperlipidemia, unspecified: Secondary | ICD-10-CM | POA: Diagnosis not present

## 2017-06-25 DIAGNOSIS — I110 Hypertensive heart disease with heart failure: Secondary | ICD-10-CM | POA: Diagnosis not present

## 2017-06-25 DIAGNOSIS — I4891 Unspecified atrial fibrillation: Secondary | ICD-10-CM | POA: Diagnosis not present

## 2017-06-25 DIAGNOSIS — I061 Rheumatic aortic insufficiency: Secondary | ICD-10-CM | POA: Diagnosis not present

## 2017-06-27 DIAGNOSIS — I110 Hypertensive heart disease with heart failure: Secondary | ICD-10-CM | POA: Diagnosis not present

## 2017-06-27 DIAGNOSIS — Z7901 Long term (current) use of anticoagulants: Secondary | ICD-10-CM | POA: Diagnosis not present

## 2017-06-27 DIAGNOSIS — Z8673 Personal history of transient ischemic attack (TIA), and cerebral infarction without residual deficits: Secondary | ICD-10-CM | POA: Diagnosis not present

## 2017-06-27 DIAGNOSIS — M6281 Muscle weakness (generalized): Secondary | ICD-10-CM | POA: Diagnosis not present

## 2017-06-27 DIAGNOSIS — E785 Hyperlipidemia, unspecified: Secondary | ICD-10-CM | POA: Diagnosis not present

## 2017-06-27 DIAGNOSIS — I061 Rheumatic aortic insufficiency: Secondary | ICD-10-CM | POA: Diagnosis not present

## 2017-06-27 DIAGNOSIS — I4891 Unspecified atrial fibrillation: Secondary | ICD-10-CM | POA: Diagnosis not present

## 2017-06-27 DIAGNOSIS — I509 Heart failure, unspecified: Secondary | ICD-10-CM | POA: Diagnosis not present

## 2017-06-27 DIAGNOSIS — Z9181 History of falling: Secondary | ICD-10-CM | POA: Diagnosis not present

## 2017-06-28 DIAGNOSIS — E119 Type 2 diabetes mellitus without complications: Secondary | ICD-10-CM | POA: Diagnosis not present

## 2017-06-28 DIAGNOSIS — E559 Vitamin D deficiency, unspecified: Secondary | ICD-10-CM | POA: Diagnosis not present

## 2017-06-28 DIAGNOSIS — D518 Other vitamin B12 deficiency anemias: Secondary | ICD-10-CM | POA: Diagnosis not present

## 2017-06-28 DIAGNOSIS — Z7901 Long term (current) use of anticoagulants: Secondary | ICD-10-CM | POA: Diagnosis not present

## 2017-06-28 DIAGNOSIS — Z79899 Other long term (current) drug therapy: Secondary | ICD-10-CM | POA: Diagnosis not present

## 2017-06-28 DIAGNOSIS — E7849 Other hyperlipidemia: Secondary | ICD-10-CM | POA: Diagnosis not present

## 2017-06-28 DIAGNOSIS — E038 Other specified hypothyroidism: Secondary | ICD-10-CM | POA: Diagnosis not present

## 2017-06-29 DIAGNOSIS — Z9181 History of falling: Secondary | ICD-10-CM | POA: Diagnosis not present

## 2017-06-29 DIAGNOSIS — I4891 Unspecified atrial fibrillation: Secondary | ICD-10-CM | POA: Diagnosis not present

## 2017-06-29 DIAGNOSIS — I509 Heart failure, unspecified: Secondary | ICD-10-CM | POA: Diagnosis not present

## 2017-06-29 DIAGNOSIS — Z7901 Long term (current) use of anticoagulants: Secondary | ICD-10-CM | POA: Diagnosis not present

## 2017-06-29 DIAGNOSIS — I110 Hypertensive heart disease with heart failure: Secondary | ICD-10-CM | POA: Diagnosis not present

## 2017-06-29 DIAGNOSIS — E785 Hyperlipidemia, unspecified: Secondary | ICD-10-CM | POA: Diagnosis not present

## 2017-06-29 DIAGNOSIS — Z8673 Personal history of transient ischemic attack (TIA), and cerebral infarction without residual deficits: Secondary | ICD-10-CM | POA: Diagnosis not present

## 2017-06-29 DIAGNOSIS — M6281 Muscle weakness (generalized): Secondary | ICD-10-CM | POA: Diagnosis not present

## 2017-06-29 DIAGNOSIS — I061 Rheumatic aortic insufficiency: Secondary | ICD-10-CM | POA: Diagnosis not present

## 2017-07-03 DIAGNOSIS — M6281 Muscle weakness (generalized): Secondary | ICD-10-CM | POA: Diagnosis not present

## 2017-07-03 DIAGNOSIS — I509 Heart failure, unspecified: Secondary | ICD-10-CM | POA: Diagnosis not present

## 2017-07-03 DIAGNOSIS — I110 Hypertensive heart disease with heart failure: Secondary | ICD-10-CM | POA: Diagnosis not present

## 2017-07-03 DIAGNOSIS — I061 Rheumatic aortic insufficiency: Secondary | ICD-10-CM | POA: Diagnosis not present

## 2017-07-03 DIAGNOSIS — E785 Hyperlipidemia, unspecified: Secondary | ICD-10-CM | POA: Diagnosis not present

## 2017-07-03 DIAGNOSIS — Z9181 History of falling: Secondary | ICD-10-CM | POA: Diagnosis not present

## 2017-07-03 DIAGNOSIS — Z7901 Long term (current) use of anticoagulants: Secondary | ICD-10-CM | POA: Diagnosis not present

## 2017-07-03 DIAGNOSIS — I4891 Unspecified atrial fibrillation: Secondary | ICD-10-CM | POA: Diagnosis not present

## 2017-07-03 DIAGNOSIS — Z8673 Personal history of transient ischemic attack (TIA), and cerebral infarction without residual deficits: Secondary | ICD-10-CM | POA: Diagnosis not present

## 2017-07-04 DIAGNOSIS — I110 Hypertensive heart disease with heart failure: Secondary | ICD-10-CM | POA: Diagnosis not present

## 2017-07-04 DIAGNOSIS — Z7901 Long term (current) use of anticoagulants: Secondary | ICD-10-CM | POA: Diagnosis not present

## 2017-07-04 DIAGNOSIS — I4891 Unspecified atrial fibrillation: Secondary | ICD-10-CM | POA: Diagnosis not present

## 2017-07-04 DIAGNOSIS — Z9181 History of falling: Secondary | ICD-10-CM | POA: Diagnosis not present

## 2017-07-04 DIAGNOSIS — Z8673 Personal history of transient ischemic attack (TIA), and cerebral infarction without residual deficits: Secondary | ICD-10-CM | POA: Diagnosis not present

## 2017-07-04 DIAGNOSIS — I509 Heart failure, unspecified: Secondary | ICD-10-CM | POA: Diagnosis not present

## 2017-07-04 DIAGNOSIS — I061 Rheumatic aortic insufficiency: Secondary | ICD-10-CM | POA: Diagnosis not present

## 2017-07-04 DIAGNOSIS — E785 Hyperlipidemia, unspecified: Secondary | ICD-10-CM | POA: Diagnosis not present

## 2017-07-04 DIAGNOSIS — M6281 Muscle weakness (generalized): Secondary | ICD-10-CM | POA: Diagnosis not present

## 2017-07-06 DIAGNOSIS — M6281 Muscle weakness (generalized): Secondary | ICD-10-CM | POA: Diagnosis not present

## 2017-07-06 DIAGNOSIS — I061 Rheumatic aortic insufficiency: Secondary | ICD-10-CM | POA: Diagnosis not present

## 2017-07-06 DIAGNOSIS — Z9181 History of falling: Secondary | ICD-10-CM | POA: Diagnosis not present

## 2017-07-06 DIAGNOSIS — I110 Hypertensive heart disease with heart failure: Secondary | ICD-10-CM | POA: Diagnosis not present

## 2017-07-06 DIAGNOSIS — Z8673 Personal history of transient ischemic attack (TIA), and cerebral infarction without residual deficits: Secondary | ICD-10-CM | POA: Diagnosis not present

## 2017-07-06 DIAGNOSIS — E785 Hyperlipidemia, unspecified: Secondary | ICD-10-CM | POA: Diagnosis not present

## 2017-07-06 DIAGNOSIS — I4891 Unspecified atrial fibrillation: Secondary | ICD-10-CM | POA: Diagnosis not present

## 2017-07-06 DIAGNOSIS — I509 Heart failure, unspecified: Secondary | ICD-10-CM | POA: Diagnosis not present

## 2017-07-06 DIAGNOSIS — Z7901 Long term (current) use of anticoagulants: Secondary | ICD-10-CM | POA: Diagnosis not present

## 2017-07-09 DIAGNOSIS — I509 Heart failure, unspecified: Secondary | ICD-10-CM | POA: Diagnosis not present

## 2017-07-09 DIAGNOSIS — Z7901 Long term (current) use of anticoagulants: Secondary | ICD-10-CM | POA: Diagnosis not present

## 2017-07-09 DIAGNOSIS — Z8673 Personal history of transient ischemic attack (TIA), and cerebral infarction without residual deficits: Secondary | ICD-10-CM | POA: Diagnosis not present

## 2017-07-09 DIAGNOSIS — E785 Hyperlipidemia, unspecified: Secondary | ICD-10-CM | POA: Diagnosis not present

## 2017-07-09 DIAGNOSIS — I110 Hypertensive heart disease with heart failure: Secondary | ICD-10-CM | POA: Diagnosis not present

## 2017-07-09 DIAGNOSIS — M6281 Muscle weakness (generalized): Secondary | ICD-10-CM | POA: Diagnosis not present

## 2017-07-09 DIAGNOSIS — I061 Rheumatic aortic insufficiency: Secondary | ICD-10-CM | POA: Diagnosis not present

## 2017-07-09 DIAGNOSIS — I4891 Unspecified atrial fibrillation: Secondary | ICD-10-CM | POA: Diagnosis not present

## 2017-07-09 DIAGNOSIS — Z9181 History of falling: Secondary | ICD-10-CM | POA: Diagnosis not present

## 2017-07-10 DIAGNOSIS — I061 Rheumatic aortic insufficiency: Secondary | ICD-10-CM | POA: Diagnosis not present

## 2017-07-10 DIAGNOSIS — Z8673 Personal history of transient ischemic attack (TIA), and cerebral infarction without residual deficits: Secondary | ICD-10-CM | POA: Diagnosis not present

## 2017-07-10 DIAGNOSIS — I4891 Unspecified atrial fibrillation: Secondary | ICD-10-CM | POA: Diagnosis not present

## 2017-07-10 DIAGNOSIS — Z9181 History of falling: Secondary | ICD-10-CM | POA: Diagnosis not present

## 2017-07-10 DIAGNOSIS — I509 Heart failure, unspecified: Secondary | ICD-10-CM | POA: Diagnosis not present

## 2017-07-10 DIAGNOSIS — Z7901 Long term (current) use of anticoagulants: Secondary | ICD-10-CM | POA: Diagnosis not present

## 2017-07-10 DIAGNOSIS — I110 Hypertensive heart disease with heart failure: Secondary | ICD-10-CM | POA: Diagnosis not present

## 2017-07-10 DIAGNOSIS — E785 Hyperlipidemia, unspecified: Secondary | ICD-10-CM | POA: Diagnosis not present

## 2017-07-10 DIAGNOSIS — M6281 Muscle weakness (generalized): Secondary | ICD-10-CM | POA: Diagnosis not present

## 2017-07-11 DIAGNOSIS — I509 Heart failure, unspecified: Secondary | ICD-10-CM | POA: Diagnosis not present

## 2017-07-11 DIAGNOSIS — Z7901 Long term (current) use of anticoagulants: Secondary | ICD-10-CM | POA: Diagnosis not present

## 2017-07-11 DIAGNOSIS — I061 Rheumatic aortic insufficiency: Secondary | ICD-10-CM | POA: Diagnosis not present

## 2017-07-11 DIAGNOSIS — Z8673 Personal history of transient ischemic attack (TIA), and cerebral infarction without residual deficits: Secondary | ICD-10-CM | POA: Diagnosis not present

## 2017-07-11 DIAGNOSIS — Z9181 History of falling: Secondary | ICD-10-CM | POA: Diagnosis not present

## 2017-07-11 DIAGNOSIS — I110 Hypertensive heart disease with heart failure: Secondary | ICD-10-CM | POA: Diagnosis not present

## 2017-07-11 DIAGNOSIS — M6281 Muscle weakness (generalized): Secondary | ICD-10-CM | POA: Diagnosis not present

## 2017-07-11 DIAGNOSIS — E785 Hyperlipidemia, unspecified: Secondary | ICD-10-CM | POA: Diagnosis not present

## 2017-07-11 DIAGNOSIS — I4891 Unspecified atrial fibrillation: Secondary | ICD-10-CM | POA: Diagnosis not present

## 2017-07-12 DIAGNOSIS — Z7901 Long term (current) use of anticoagulants: Secondary | ICD-10-CM | POA: Diagnosis not present

## 2017-07-16 DIAGNOSIS — Z8673 Personal history of transient ischemic attack (TIA), and cerebral infarction without residual deficits: Secondary | ICD-10-CM | POA: Diagnosis not present

## 2017-07-16 DIAGNOSIS — Z7901 Long term (current) use of anticoagulants: Secondary | ICD-10-CM | POA: Diagnosis not present

## 2017-07-16 DIAGNOSIS — I061 Rheumatic aortic insufficiency: Secondary | ICD-10-CM | POA: Diagnosis not present

## 2017-07-16 DIAGNOSIS — Z9181 History of falling: Secondary | ICD-10-CM | POA: Diagnosis not present

## 2017-07-16 DIAGNOSIS — E785 Hyperlipidemia, unspecified: Secondary | ICD-10-CM | POA: Diagnosis not present

## 2017-07-16 DIAGNOSIS — I509 Heart failure, unspecified: Secondary | ICD-10-CM | POA: Diagnosis not present

## 2017-07-16 DIAGNOSIS — I4891 Unspecified atrial fibrillation: Secondary | ICD-10-CM | POA: Diagnosis not present

## 2017-07-16 DIAGNOSIS — I110 Hypertensive heart disease with heart failure: Secondary | ICD-10-CM | POA: Diagnosis not present

## 2017-07-16 DIAGNOSIS — M6281 Muscle weakness (generalized): Secondary | ICD-10-CM | POA: Diagnosis not present

## 2017-07-17 DIAGNOSIS — E785 Hyperlipidemia, unspecified: Secondary | ICD-10-CM | POA: Diagnosis not present

## 2017-07-17 DIAGNOSIS — Z9181 History of falling: Secondary | ICD-10-CM | POA: Diagnosis not present

## 2017-07-17 DIAGNOSIS — Z7901 Long term (current) use of anticoagulants: Secondary | ICD-10-CM | POA: Diagnosis not present

## 2017-07-17 DIAGNOSIS — Z8673 Personal history of transient ischemic attack (TIA), and cerebral infarction without residual deficits: Secondary | ICD-10-CM | POA: Diagnosis not present

## 2017-07-17 DIAGNOSIS — I061 Rheumatic aortic insufficiency: Secondary | ICD-10-CM | POA: Diagnosis not present

## 2017-07-17 DIAGNOSIS — I509 Heart failure, unspecified: Secondary | ICD-10-CM | POA: Diagnosis not present

## 2017-07-17 DIAGNOSIS — I4891 Unspecified atrial fibrillation: Secondary | ICD-10-CM | POA: Diagnosis not present

## 2017-07-17 DIAGNOSIS — M6281 Muscle weakness (generalized): Secondary | ICD-10-CM | POA: Diagnosis not present

## 2017-07-17 DIAGNOSIS — I110 Hypertensive heart disease with heart failure: Secondary | ICD-10-CM | POA: Diagnosis not present

## 2017-07-18 DIAGNOSIS — Z7901 Long term (current) use of anticoagulants: Secondary | ICD-10-CM | POA: Diagnosis not present

## 2017-07-18 DIAGNOSIS — I061 Rheumatic aortic insufficiency: Secondary | ICD-10-CM | POA: Diagnosis not present

## 2017-07-18 DIAGNOSIS — M6281 Muscle weakness (generalized): Secondary | ICD-10-CM | POA: Diagnosis not present

## 2017-07-18 DIAGNOSIS — Z9181 History of falling: Secondary | ICD-10-CM | POA: Diagnosis not present

## 2017-07-18 DIAGNOSIS — I509 Heart failure, unspecified: Secondary | ICD-10-CM | POA: Diagnosis not present

## 2017-07-18 DIAGNOSIS — I4891 Unspecified atrial fibrillation: Secondary | ICD-10-CM | POA: Diagnosis not present

## 2017-07-18 DIAGNOSIS — Z8673 Personal history of transient ischemic attack (TIA), and cerebral infarction without residual deficits: Secondary | ICD-10-CM | POA: Diagnosis not present

## 2017-07-18 DIAGNOSIS — I110 Hypertensive heart disease with heart failure: Secondary | ICD-10-CM | POA: Diagnosis not present

## 2017-07-18 DIAGNOSIS — E785 Hyperlipidemia, unspecified: Secondary | ICD-10-CM | POA: Diagnosis not present

## 2017-07-20 DIAGNOSIS — I10 Essential (primary) hypertension: Secondary | ICD-10-CM | POA: Diagnosis not present

## 2017-07-20 DIAGNOSIS — I5032 Chronic diastolic (congestive) heart failure: Secondary | ICD-10-CM | POA: Diagnosis not present

## 2017-07-20 DIAGNOSIS — R296 Repeated falls: Secondary | ICD-10-CM | POA: Diagnosis not present

## 2017-07-20 DIAGNOSIS — I482 Chronic atrial fibrillation: Secondary | ICD-10-CM | POA: Diagnosis not present

## 2017-07-22 DIAGNOSIS — Z881 Allergy status to other antibiotic agents status: Secondary | ICD-10-CM | POA: Diagnosis not present

## 2017-07-22 DIAGNOSIS — Z880 Allergy status to penicillin: Secondary | ICD-10-CM | POA: Diagnosis not present

## 2017-07-22 DIAGNOSIS — Z886 Allergy status to analgesic agent status: Secondary | ICD-10-CM | POA: Diagnosis not present

## 2017-07-22 DIAGNOSIS — Z882 Allergy status to sulfonamides status: Secondary | ICD-10-CM | POA: Diagnosis not present

## 2017-07-22 DIAGNOSIS — I110 Hypertensive heart disease with heart failure: Secondary | ICD-10-CM | POA: Diagnosis not present

## 2017-07-22 DIAGNOSIS — I4891 Unspecified atrial fibrillation: Secondary | ICD-10-CM | POA: Diagnosis not present

## 2017-07-22 DIAGNOSIS — Z888 Allergy status to other drugs, medicaments and biological substances status: Secondary | ICD-10-CM | POA: Diagnosis not present

## 2017-07-22 DIAGNOSIS — Z885 Allergy status to narcotic agent status: Secondary | ICD-10-CM | POA: Diagnosis not present

## 2017-07-22 DIAGNOSIS — R05 Cough: Secondary | ICD-10-CM | POA: Diagnosis not present

## 2017-07-22 DIAGNOSIS — R918 Other nonspecific abnormal finding of lung field: Secondary | ICD-10-CM | POA: Diagnosis not present

## 2017-07-22 DIAGNOSIS — R509 Fever, unspecified: Secondary | ICD-10-CM | POA: Diagnosis not present

## 2017-07-22 DIAGNOSIS — I509 Heart failure, unspecified: Secondary | ICD-10-CM | POA: Diagnosis not present

## 2017-07-23 DIAGNOSIS — B999 Unspecified infectious disease: Secondary | ICD-10-CM | POA: Diagnosis not present

## 2017-07-23 DIAGNOSIS — R066 Hiccough: Secondary | ICD-10-CM | POA: Diagnosis not present

## 2017-07-24 DIAGNOSIS — Z7901 Long term (current) use of anticoagulants: Secondary | ICD-10-CM | POA: Diagnosis not present

## 2017-07-24 DIAGNOSIS — E785 Hyperlipidemia, unspecified: Secondary | ICD-10-CM | POA: Diagnosis not present

## 2017-07-24 DIAGNOSIS — I509 Heart failure, unspecified: Secondary | ICD-10-CM | POA: Diagnosis not present

## 2017-07-24 DIAGNOSIS — Z8673 Personal history of transient ischemic attack (TIA), and cerebral infarction without residual deficits: Secondary | ICD-10-CM | POA: Diagnosis not present

## 2017-07-24 DIAGNOSIS — Z9181 History of falling: Secondary | ICD-10-CM | POA: Diagnosis not present

## 2017-07-24 DIAGNOSIS — I110 Hypertensive heart disease with heart failure: Secondary | ICD-10-CM | POA: Diagnosis not present

## 2017-07-24 DIAGNOSIS — M6281 Muscle weakness (generalized): Secondary | ICD-10-CM | POA: Diagnosis not present

## 2017-07-24 DIAGNOSIS — I4891 Unspecified atrial fibrillation: Secondary | ICD-10-CM | POA: Diagnosis not present

## 2017-07-24 DIAGNOSIS — I061 Rheumatic aortic insufficiency: Secondary | ICD-10-CM | POA: Diagnosis not present

## 2017-07-25 DIAGNOSIS — Z8673 Personal history of transient ischemic attack (TIA), and cerebral infarction without residual deficits: Secondary | ICD-10-CM | POA: Diagnosis not present

## 2017-07-25 DIAGNOSIS — M6281 Muscle weakness (generalized): Secondary | ICD-10-CM | POA: Diagnosis not present

## 2017-07-25 DIAGNOSIS — I061 Rheumatic aortic insufficiency: Secondary | ICD-10-CM | POA: Diagnosis not present

## 2017-07-25 DIAGNOSIS — Z7901 Long term (current) use of anticoagulants: Secondary | ICD-10-CM | POA: Diagnosis not present

## 2017-07-25 DIAGNOSIS — E785 Hyperlipidemia, unspecified: Secondary | ICD-10-CM | POA: Diagnosis not present

## 2017-07-25 DIAGNOSIS — I509 Heart failure, unspecified: Secondary | ICD-10-CM | POA: Diagnosis not present

## 2017-07-25 DIAGNOSIS — Z9181 History of falling: Secondary | ICD-10-CM | POA: Diagnosis not present

## 2017-07-25 DIAGNOSIS — I110 Hypertensive heart disease with heart failure: Secondary | ICD-10-CM | POA: Diagnosis not present

## 2017-07-25 DIAGNOSIS — I4891 Unspecified atrial fibrillation: Secondary | ICD-10-CM | POA: Diagnosis not present

## 2017-07-27 DIAGNOSIS — M6281 Muscle weakness (generalized): Secondary | ICD-10-CM | POA: Diagnosis not present

## 2017-07-27 DIAGNOSIS — E785 Hyperlipidemia, unspecified: Secondary | ICD-10-CM | POA: Diagnosis not present

## 2017-07-27 DIAGNOSIS — I061 Rheumatic aortic insufficiency: Secondary | ICD-10-CM | POA: Diagnosis not present

## 2017-07-27 DIAGNOSIS — Z7901 Long term (current) use of anticoagulants: Secondary | ICD-10-CM | POA: Diagnosis not present

## 2017-07-27 DIAGNOSIS — Z9181 History of falling: Secondary | ICD-10-CM | POA: Diagnosis not present

## 2017-07-27 DIAGNOSIS — J40 Bronchitis, not specified as acute or chronic: Secondary | ICD-10-CM | POA: Diagnosis not present

## 2017-07-27 DIAGNOSIS — I4891 Unspecified atrial fibrillation: Secondary | ICD-10-CM | POA: Diagnosis not present

## 2017-07-27 DIAGNOSIS — I110 Hypertensive heart disease with heart failure: Secondary | ICD-10-CM | POA: Diagnosis not present

## 2017-07-27 DIAGNOSIS — Z8673 Personal history of transient ischemic attack (TIA), and cerebral infarction without residual deficits: Secondary | ICD-10-CM | POA: Diagnosis not present

## 2017-07-27 DIAGNOSIS — I509 Heart failure, unspecified: Secondary | ICD-10-CM | POA: Diagnosis not present

## 2017-07-30 DIAGNOSIS — R0989 Other specified symptoms and signs involving the circulatory and respiratory systems: Secondary | ICD-10-CM | POA: Diagnosis not present

## 2017-07-30 DIAGNOSIS — R05 Cough: Secondary | ICD-10-CM | POA: Diagnosis not present

## 2017-07-30 DIAGNOSIS — T17200A Unspecified foreign body in pharynx causing asphyxiation, initial encounter: Secondary | ICD-10-CM | POA: Diagnosis not present

## 2017-07-30 DIAGNOSIS — J80 Acute respiratory distress syndrome: Secondary | ICD-10-CM | POA: Diagnosis not present

## 2017-07-30 DIAGNOSIS — T17320A Food in larynx causing asphyxiation, initial encounter: Secondary | ICD-10-CM | POA: Diagnosis not present

## 2017-07-31 DIAGNOSIS — Z9181 History of falling: Secondary | ICD-10-CM | POA: Diagnosis not present

## 2017-07-31 DIAGNOSIS — I110 Hypertensive heart disease with heart failure: Secondary | ICD-10-CM | POA: Diagnosis not present

## 2017-07-31 DIAGNOSIS — M6281 Muscle weakness (generalized): Secondary | ICD-10-CM | POA: Diagnosis not present

## 2017-07-31 DIAGNOSIS — Z8673 Personal history of transient ischemic attack (TIA), and cerebral infarction without residual deficits: Secondary | ICD-10-CM | POA: Diagnosis not present

## 2017-07-31 DIAGNOSIS — I061 Rheumatic aortic insufficiency: Secondary | ICD-10-CM | POA: Diagnosis not present

## 2017-07-31 DIAGNOSIS — E785 Hyperlipidemia, unspecified: Secondary | ICD-10-CM | POA: Diagnosis not present

## 2017-07-31 DIAGNOSIS — I4891 Unspecified atrial fibrillation: Secondary | ICD-10-CM | POA: Diagnosis not present

## 2017-07-31 DIAGNOSIS — I509 Heart failure, unspecified: Secondary | ICD-10-CM | POA: Diagnosis not present

## 2017-07-31 DIAGNOSIS — Z7901 Long term (current) use of anticoagulants: Secondary | ICD-10-CM | POA: Diagnosis not present

## 2017-08-01 DIAGNOSIS — Z8673 Personal history of transient ischemic attack (TIA), and cerebral infarction without residual deficits: Secondary | ICD-10-CM | POA: Diagnosis not present

## 2017-08-01 DIAGNOSIS — M6281 Muscle weakness (generalized): Secondary | ICD-10-CM | POA: Diagnosis not present

## 2017-08-01 DIAGNOSIS — E785 Hyperlipidemia, unspecified: Secondary | ICD-10-CM | POA: Diagnosis not present

## 2017-08-01 DIAGNOSIS — I110 Hypertensive heart disease with heart failure: Secondary | ICD-10-CM | POA: Diagnosis not present

## 2017-08-01 DIAGNOSIS — I4891 Unspecified atrial fibrillation: Secondary | ICD-10-CM | POA: Diagnosis not present

## 2017-08-01 DIAGNOSIS — Z7901 Long term (current) use of anticoagulants: Secondary | ICD-10-CM | POA: Diagnosis not present

## 2017-08-01 DIAGNOSIS — I509 Heart failure, unspecified: Secondary | ICD-10-CM | POA: Diagnosis not present

## 2017-08-01 DIAGNOSIS — Z9181 History of falling: Secondary | ICD-10-CM | POA: Diagnosis not present

## 2017-08-01 DIAGNOSIS — I061 Rheumatic aortic insufficiency: Secondary | ICD-10-CM | POA: Diagnosis not present

## 2017-08-03 DIAGNOSIS — I482 Chronic atrial fibrillation: Secondary | ICD-10-CM | POA: Diagnosis not present

## 2017-08-03 DIAGNOSIS — R1319 Other dysphagia: Secondary | ICD-10-CM | POA: Diagnosis not present

## 2017-08-06 DIAGNOSIS — Z7901 Long term (current) use of anticoagulants: Secondary | ICD-10-CM | POA: Diagnosis not present

## 2017-08-08 DIAGNOSIS — Z7901 Long term (current) use of anticoagulants: Secondary | ICD-10-CM | POA: Diagnosis not present

## 2017-08-13 DIAGNOSIS — Z7901 Long term (current) use of anticoagulants: Secondary | ICD-10-CM | POA: Diagnosis not present

## 2017-08-17 DIAGNOSIS — I10 Essential (primary) hypertension: Secondary | ICD-10-CM | POA: Diagnosis not present

## 2017-08-17 DIAGNOSIS — R1319 Other dysphagia: Secondary | ICD-10-CM | POA: Diagnosis not present

## 2017-08-17 DIAGNOSIS — I5032 Chronic diastolic (congestive) heart failure: Secondary | ICD-10-CM | POA: Diagnosis not present

## 2017-08-17 DIAGNOSIS — I482 Chronic atrial fibrillation: Secondary | ICD-10-CM | POA: Diagnosis not present

## 2017-09-11 DIAGNOSIS — D518 Other vitamin B12 deficiency anemias: Secondary | ICD-10-CM | POA: Diagnosis not present

## 2017-09-11 DIAGNOSIS — E119 Type 2 diabetes mellitus without complications: Secondary | ICD-10-CM | POA: Diagnosis not present

## 2017-09-11 DIAGNOSIS — E038 Other specified hypothyroidism: Secondary | ICD-10-CM | POA: Diagnosis not present

## 2017-09-18 DIAGNOSIS — Z87440 Personal history of urinary (tract) infections: Secondary | ICD-10-CM | POA: Diagnosis not present

## 2017-09-18 DIAGNOSIS — I5032 Chronic diastolic (congestive) heart failure: Secondary | ICD-10-CM | POA: Diagnosis not present

## 2017-09-18 DIAGNOSIS — Z9181 History of falling: Secondary | ICD-10-CM | POA: Diagnosis not present

## 2017-09-18 DIAGNOSIS — R1311 Dysphagia, oral phase: Secondary | ICD-10-CM | POA: Diagnosis not present

## 2017-09-18 DIAGNOSIS — Z7901 Long term (current) use of anticoagulants: Secondary | ICD-10-CM | POA: Diagnosis not present

## 2017-09-18 DIAGNOSIS — I4891 Unspecified atrial fibrillation: Secondary | ICD-10-CM | POA: Diagnosis not present

## 2017-09-18 DIAGNOSIS — I351 Nonrheumatic aortic (valve) insufficiency: Secondary | ICD-10-CM | POA: Diagnosis not present

## 2017-09-18 DIAGNOSIS — I110 Hypertensive heart disease with heart failure: Secondary | ICD-10-CM | POA: Diagnosis not present

## 2017-09-18 DIAGNOSIS — Z79891 Long term (current) use of opiate analgesic: Secondary | ICD-10-CM | POA: Diagnosis not present

## 2017-09-18 DIAGNOSIS — M1991 Primary osteoarthritis, unspecified site: Secondary | ICD-10-CM | POA: Diagnosis not present

## 2017-09-18 DIAGNOSIS — R0989 Other specified symptoms and signs involving the circulatory and respiratory systems: Secondary | ICD-10-CM | POA: Diagnosis not present

## 2017-09-20 DIAGNOSIS — I5032 Chronic diastolic (congestive) heart failure: Secondary | ICD-10-CM | POA: Diagnosis not present

## 2017-09-20 DIAGNOSIS — Z87440 Personal history of urinary (tract) infections: Secondary | ICD-10-CM | POA: Diagnosis not present

## 2017-09-20 DIAGNOSIS — R0989 Other specified symptoms and signs involving the circulatory and respiratory systems: Secondary | ICD-10-CM | POA: Diagnosis not present

## 2017-09-20 DIAGNOSIS — I351 Nonrheumatic aortic (valve) insufficiency: Secondary | ICD-10-CM | POA: Diagnosis not present

## 2017-09-20 DIAGNOSIS — Z79891 Long term (current) use of opiate analgesic: Secondary | ICD-10-CM | POA: Diagnosis not present

## 2017-09-20 DIAGNOSIS — Z9181 History of falling: Secondary | ICD-10-CM | POA: Diagnosis not present

## 2017-09-20 DIAGNOSIS — M1991 Primary osteoarthritis, unspecified site: Secondary | ICD-10-CM | POA: Diagnosis not present

## 2017-09-20 DIAGNOSIS — R1311 Dysphagia, oral phase: Secondary | ICD-10-CM | POA: Diagnosis not present

## 2017-09-20 DIAGNOSIS — I4891 Unspecified atrial fibrillation: Secondary | ICD-10-CM | POA: Diagnosis not present

## 2017-09-20 DIAGNOSIS — I110 Hypertensive heart disease with heart failure: Secondary | ICD-10-CM | POA: Diagnosis not present

## 2017-09-20 DIAGNOSIS — Z7901 Long term (current) use of anticoagulants: Secondary | ICD-10-CM | POA: Diagnosis not present

## 2017-09-21 DIAGNOSIS — I5032 Chronic diastolic (congestive) heart failure: Secondary | ICD-10-CM | POA: Diagnosis not present

## 2017-09-21 DIAGNOSIS — R296 Repeated falls: Secondary | ICD-10-CM | POA: Diagnosis not present

## 2017-09-21 DIAGNOSIS — I482 Chronic atrial fibrillation: Secondary | ICD-10-CM | POA: Diagnosis not present

## 2017-09-21 DIAGNOSIS — I10 Essential (primary) hypertension: Secondary | ICD-10-CM | POA: Diagnosis not present

## 2017-09-22 DIAGNOSIS — M25561 Pain in right knee: Secondary | ICD-10-CM | POA: Diagnosis not present

## 2017-09-22 DIAGNOSIS — M25562 Pain in left knee: Secondary | ICD-10-CM | POA: Diagnosis not present

## 2017-09-23 DIAGNOSIS — R030 Elevated blood-pressure reading, without diagnosis of hypertension: Secondary | ICD-10-CM | POA: Diagnosis not present

## 2017-09-23 DIAGNOSIS — Z881 Allergy status to other antibiotic agents status: Secondary | ICD-10-CM | POA: Diagnosis not present

## 2017-09-23 DIAGNOSIS — R040 Epistaxis: Secondary | ICD-10-CM | POA: Diagnosis not present

## 2017-09-23 DIAGNOSIS — Z888 Allergy status to other drugs, medicaments and biological substances status: Secondary | ICD-10-CM | POA: Diagnosis not present

## 2017-09-23 DIAGNOSIS — Z885 Allergy status to narcotic agent status: Secondary | ICD-10-CM | POA: Diagnosis not present

## 2017-09-23 DIAGNOSIS — L821 Other seborrheic keratosis: Secondary | ICD-10-CM | POA: Diagnosis not present

## 2017-09-23 DIAGNOSIS — Z882 Allergy status to sulfonamides status: Secondary | ICD-10-CM | POA: Diagnosis not present

## 2017-09-23 DIAGNOSIS — Z886 Allergy status to analgesic agent status: Secondary | ICD-10-CM | POA: Diagnosis not present

## 2017-09-23 DIAGNOSIS — N390 Urinary tract infection, site not specified: Secondary | ICD-10-CM | POA: Diagnosis not present

## 2017-09-23 DIAGNOSIS — Z880 Allergy status to penicillin: Secondary | ICD-10-CM | POA: Diagnosis not present

## 2017-09-24 DIAGNOSIS — R1311 Dysphagia, oral phase: Secondary | ICD-10-CM | POA: Diagnosis not present

## 2017-09-24 DIAGNOSIS — I351 Nonrheumatic aortic (valve) insufficiency: Secondary | ICD-10-CM | POA: Diagnosis not present

## 2017-09-24 DIAGNOSIS — Z79891 Long term (current) use of opiate analgesic: Secondary | ICD-10-CM | POA: Diagnosis not present

## 2017-09-24 DIAGNOSIS — R0989 Other specified symptoms and signs involving the circulatory and respiratory systems: Secondary | ICD-10-CM | POA: Diagnosis not present

## 2017-09-24 DIAGNOSIS — Z87440 Personal history of urinary (tract) infections: Secondary | ICD-10-CM | POA: Diagnosis not present

## 2017-09-24 DIAGNOSIS — I4891 Unspecified atrial fibrillation: Secondary | ICD-10-CM | POA: Diagnosis not present

## 2017-09-24 DIAGNOSIS — I5032 Chronic diastolic (congestive) heart failure: Secondary | ICD-10-CM | POA: Diagnosis not present

## 2017-09-24 DIAGNOSIS — Z7901 Long term (current) use of anticoagulants: Secondary | ICD-10-CM | POA: Diagnosis not present

## 2017-09-24 DIAGNOSIS — I110 Hypertensive heart disease with heart failure: Secondary | ICD-10-CM | POA: Diagnosis not present

## 2017-09-24 DIAGNOSIS — Z9181 History of falling: Secondary | ICD-10-CM | POA: Diagnosis not present

## 2017-09-24 DIAGNOSIS — M1991 Primary osteoarthritis, unspecified site: Secondary | ICD-10-CM | POA: Diagnosis not present

## 2017-09-28 DIAGNOSIS — I4891 Unspecified atrial fibrillation: Secondary | ICD-10-CM | POA: Diagnosis not present

## 2017-09-28 DIAGNOSIS — I351 Nonrheumatic aortic (valve) insufficiency: Secondary | ICD-10-CM | POA: Diagnosis not present

## 2017-09-28 DIAGNOSIS — Z87440 Personal history of urinary (tract) infections: Secondary | ICD-10-CM | POA: Diagnosis not present

## 2017-09-28 DIAGNOSIS — Z9181 History of falling: Secondary | ICD-10-CM | POA: Diagnosis not present

## 2017-09-28 DIAGNOSIS — I110 Hypertensive heart disease with heart failure: Secondary | ICD-10-CM | POA: Diagnosis not present

## 2017-09-28 DIAGNOSIS — Z79891 Long term (current) use of opiate analgesic: Secondary | ICD-10-CM | POA: Diagnosis not present

## 2017-09-28 DIAGNOSIS — R0989 Other specified symptoms and signs involving the circulatory and respiratory systems: Secondary | ICD-10-CM | POA: Diagnosis not present

## 2017-09-28 DIAGNOSIS — Z7901 Long term (current) use of anticoagulants: Secondary | ICD-10-CM | POA: Diagnosis not present

## 2017-09-28 DIAGNOSIS — R195 Other fecal abnormalities: Secondary | ICD-10-CM | POA: Diagnosis not present

## 2017-09-28 DIAGNOSIS — I482 Chronic atrial fibrillation: Secondary | ICD-10-CM | POA: Diagnosis not present

## 2017-09-28 DIAGNOSIS — M1991 Primary osteoarthritis, unspecified site: Secondary | ICD-10-CM | POA: Diagnosis not present

## 2017-09-28 DIAGNOSIS — R1311 Dysphagia, oral phase: Secondary | ICD-10-CM | POA: Diagnosis not present

## 2017-09-28 DIAGNOSIS — I5032 Chronic diastolic (congestive) heart failure: Secondary | ICD-10-CM | POA: Diagnosis not present

## 2017-10-01 DIAGNOSIS — E038 Other specified hypothyroidism: Secondary | ICD-10-CM | POA: Diagnosis not present

## 2017-10-01 DIAGNOSIS — Z79899 Other long term (current) drug therapy: Secondary | ICD-10-CM | POA: Diagnosis not present

## 2017-10-01 DIAGNOSIS — E7849 Other hyperlipidemia: Secondary | ICD-10-CM | POA: Diagnosis not present

## 2017-10-01 DIAGNOSIS — E559 Vitamin D deficiency, unspecified: Secondary | ICD-10-CM | POA: Diagnosis not present

## 2017-10-01 DIAGNOSIS — D518 Other vitamin B12 deficiency anemias: Secondary | ICD-10-CM | POA: Diagnosis not present

## 2017-10-01 DIAGNOSIS — E119 Type 2 diabetes mellitus without complications: Secondary | ICD-10-CM | POA: Diagnosis not present

## 2017-10-02 DIAGNOSIS — R1310 Dysphagia, unspecified: Secondary | ICD-10-CM | POA: Diagnosis not present

## 2017-10-02 DIAGNOSIS — Z7901 Long term (current) use of anticoagulants: Secondary | ICD-10-CM | POA: Diagnosis not present

## 2017-10-02 DIAGNOSIS — Z87440 Personal history of urinary (tract) infections: Secondary | ICD-10-CM | POA: Diagnosis not present

## 2017-10-02 DIAGNOSIS — I351 Nonrheumatic aortic (valve) insufficiency: Secondary | ICD-10-CM | POA: Diagnosis not present

## 2017-10-02 DIAGNOSIS — Z9181 History of falling: Secondary | ICD-10-CM | POA: Diagnosis not present

## 2017-10-02 DIAGNOSIS — I4891 Unspecified atrial fibrillation: Secondary | ICD-10-CM | POA: Diagnosis not present

## 2017-10-02 DIAGNOSIS — I110 Hypertensive heart disease with heart failure: Secondary | ICD-10-CM | POA: Diagnosis not present

## 2017-10-02 DIAGNOSIS — M1991 Primary osteoarthritis, unspecified site: Secondary | ICD-10-CM | POA: Diagnosis not present

## 2017-10-02 DIAGNOSIS — R1311 Dysphagia, oral phase: Secondary | ICD-10-CM | POA: Diagnosis not present

## 2017-10-02 DIAGNOSIS — I5032 Chronic diastolic (congestive) heart failure: Secondary | ICD-10-CM | POA: Diagnosis not present

## 2017-10-02 DIAGNOSIS — R0989 Other specified symptoms and signs involving the circulatory and respiratory systems: Secondary | ICD-10-CM | POA: Diagnosis not present

## 2017-10-02 DIAGNOSIS — Z79891 Long term (current) use of opiate analgesic: Secondary | ICD-10-CM | POA: Diagnosis not present

## 2017-10-03 DIAGNOSIS — I5032 Chronic diastolic (congestive) heart failure: Secondary | ICD-10-CM | POA: Diagnosis not present

## 2017-10-03 DIAGNOSIS — Z79891 Long term (current) use of opiate analgesic: Secondary | ICD-10-CM | POA: Diagnosis not present

## 2017-10-03 DIAGNOSIS — R1311 Dysphagia, oral phase: Secondary | ICD-10-CM | POA: Diagnosis not present

## 2017-10-03 DIAGNOSIS — I110 Hypertensive heart disease with heart failure: Secondary | ICD-10-CM | POA: Diagnosis not present

## 2017-10-03 DIAGNOSIS — Z9181 History of falling: Secondary | ICD-10-CM | POA: Diagnosis not present

## 2017-10-03 DIAGNOSIS — I4891 Unspecified atrial fibrillation: Secondary | ICD-10-CM | POA: Diagnosis not present

## 2017-10-03 DIAGNOSIS — M1991 Primary osteoarthritis, unspecified site: Secondary | ICD-10-CM | POA: Diagnosis not present

## 2017-10-03 DIAGNOSIS — I351 Nonrheumatic aortic (valve) insufficiency: Secondary | ICD-10-CM | POA: Diagnosis not present

## 2017-10-03 DIAGNOSIS — Z87440 Personal history of urinary (tract) infections: Secondary | ICD-10-CM | POA: Diagnosis not present

## 2017-10-03 DIAGNOSIS — Z7901 Long term (current) use of anticoagulants: Secondary | ICD-10-CM | POA: Diagnosis not present

## 2017-10-03 DIAGNOSIS — R0989 Other specified symptoms and signs involving the circulatory and respiratory systems: Secondary | ICD-10-CM | POA: Diagnosis not present

## 2017-10-08 DIAGNOSIS — Z9181 History of falling: Secondary | ICD-10-CM | POA: Diagnosis not present

## 2017-10-08 DIAGNOSIS — Z7901 Long term (current) use of anticoagulants: Secondary | ICD-10-CM | POA: Diagnosis not present

## 2017-10-08 DIAGNOSIS — I4891 Unspecified atrial fibrillation: Secondary | ICD-10-CM | POA: Diagnosis not present

## 2017-10-08 DIAGNOSIS — Z79891 Long term (current) use of opiate analgesic: Secondary | ICD-10-CM | POA: Diagnosis not present

## 2017-10-08 DIAGNOSIS — I351 Nonrheumatic aortic (valve) insufficiency: Secondary | ICD-10-CM | POA: Diagnosis not present

## 2017-10-08 DIAGNOSIS — R1311 Dysphagia, oral phase: Secondary | ICD-10-CM | POA: Diagnosis not present

## 2017-10-08 DIAGNOSIS — R0989 Other specified symptoms and signs involving the circulatory and respiratory systems: Secondary | ICD-10-CM | POA: Diagnosis not present

## 2017-10-08 DIAGNOSIS — Z87440 Personal history of urinary (tract) infections: Secondary | ICD-10-CM | POA: Diagnosis not present

## 2017-10-08 DIAGNOSIS — I5032 Chronic diastolic (congestive) heart failure: Secondary | ICD-10-CM | POA: Diagnosis not present

## 2017-10-08 DIAGNOSIS — I110 Hypertensive heart disease with heart failure: Secondary | ICD-10-CM | POA: Diagnosis not present

## 2017-10-08 DIAGNOSIS — M1991 Primary osteoarthritis, unspecified site: Secondary | ICD-10-CM | POA: Diagnosis not present

## 2017-10-10 DIAGNOSIS — R0989 Other specified symptoms and signs involving the circulatory and respiratory systems: Secondary | ICD-10-CM | POA: Diagnosis not present

## 2017-10-10 DIAGNOSIS — I4891 Unspecified atrial fibrillation: Secondary | ICD-10-CM | POA: Diagnosis not present

## 2017-10-10 DIAGNOSIS — Z79891 Long term (current) use of opiate analgesic: Secondary | ICD-10-CM | POA: Diagnosis not present

## 2017-10-10 DIAGNOSIS — I351 Nonrheumatic aortic (valve) insufficiency: Secondary | ICD-10-CM | POA: Diagnosis not present

## 2017-10-10 DIAGNOSIS — Z87440 Personal history of urinary (tract) infections: Secondary | ICD-10-CM | POA: Diagnosis not present

## 2017-10-10 DIAGNOSIS — M1991 Primary osteoarthritis, unspecified site: Secondary | ICD-10-CM | POA: Diagnosis not present

## 2017-10-10 DIAGNOSIS — I110 Hypertensive heart disease with heart failure: Secondary | ICD-10-CM | POA: Diagnosis not present

## 2017-10-10 DIAGNOSIS — Z9181 History of falling: Secondary | ICD-10-CM | POA: Diagnosis not present

## 2017-10-10 DIAGNOSIS — Z7901 Long term (current) use of anticoagulants: Secondary | ICD-10-CM | POA: Diagnosis not present

## 2017-10-10 DIAGNOSIS — R1311 Dysphagia, oral phase: Secondary | ICD-10-CM | POA: Diagnosis not present

## 2017-10-10 DIAGNOSIS — I5032 Chronic diastolic (congestive) heart failure: Secondary | ICD-10-CM | POA: Diagnosis not present

## 2017-10-12 DIAGNOSIS — I10 Essential (primary) hypertension: Secondary | ICD-10-CM | POA: Diagnosis not present

## 2017-10-12 DIAGNOSIS — M25561 Pain in right knee: Secondary | ICD-10-CM | POA: Diagnosis not present

## 2017-10-12 DIAGNOSIS — I5032 Chronic diastolic (congestive) heart failure: Secondary | ICD-10-CM | POA: Diagnosis not present

## 2017-10-12 DIAGNOSIS — R296 Repeated falls: Secondary | ICD-10-CM | POA: Diagnosis not present

## 2017-10-12 DIAGNOSIS — M25562 Pain in left knee: Secondary | ICD-10-CM | POA: Diagnosis not present

## 2017-10-12 DIAGNOSIS — I482 Chronic atrial fibrillation: Secondary | ICD-10-CM | POA: Diagnosis not present

## 2017-10-26 DIAGNOSIS — D518 Other vitamin B12 deficiency anemias: Secondary | ICD-10-CM | POA: Diagnosis not present

## 2017-10-26 DIAGNOSIS — E119 Type 2 diabetes mellitus without complications: Secondary | ICD-10-CM | POA: Diagnosis not present

## 2017-10-26 DIAGNOSIS — E038 Other specified hypothyroidism: Secondary | ICD-10-CM | POA: Diagnosis not present

## 2017-10-26 DIAGNOSIS — I5032 Chronic diastolic (congestive) heart failure: Secondary | ICD-10-CM | POA: Diagnosis not present

## 2017-10-26 DIAGNOSIS — R05 Cough: Secondary | ICD-10-CM | POA: Diagnosis not present

## 2017-10-29 DIAGNOSIS — R05 Cough: Secondary | ICD-10-CM | POA: Diagnosis not present

## 2017-10-31 DIAGNOSIS — R600 Localized edema: Secondary | ICD-10-CM | POA: Diagnosis not present

## 2017-10-31 DIAGNOSIS — I5032 Chronic diastolic (congestive) heart failure: Secondary | ICD-10-CM | POA: Diagnosis not present

## 2017-10-31 DIAGNOSIS — R2681 Unsteadiness on feet: Secondary | ICD-10-CM | POA: Diagnosis not present

## 2017-10-31 DIAGNOSIS — R0902 Hypoxemia: Secondary | ICD-10-CM | POA: Diagnosis not present

## 2017-10-31 DIAGNOSIS — R040 Epistaxis: Secondary | ICD-10-CM | POA: Diagnosis not present

## 2017-10-31 DIAGNOSIS — J189 Pneumonia, unspecified organism: Secondary | ICD-10-CM | POA: Diagnosis not present

## 2017-10-31 DIAGNOSIS — M6281 Muscle weakness (generalized): Secondary | ICD-10-CM | POA: Diagnosis not present

## 2017-10-31 DIAGNOSIS — K921 Melena: Secondary | ICD-10-CM | POA: Diagnosis not present

## 2017-11-02 DIAGNOSIS — I5032 Chronic diastolic (congestive) heart failure: Secondary | ICD-10-CM | POA: Diagnosis not present

## 2017-11-02 DIAGNOSIS — J181 Lobar pneumonia, unspecified organism: Secondary | ICD-10-CM | POA: Diagnosis not present

## 2017-11-09 DIAGNOSIS — I5032 Chronic diastolic (congestive) heart failure: Secondary | ICD-10-CM | POA: Diagnosis not present

## 2017-11-09 DIAGNOSIS — I482 Chronic atrial fibrillation: Secondary | ICD-10-CM | POA: Diagnosis not present

## 2017-11-09 DIAGNOSIS — E785 Hyperlipidemia, unspecified: Secondary | ICD-10-CM | POA: Diagnosis not present

## 2017-11-09 DIAGNOSIS — I10 Essential (primary) hypertension: Secondary | ICD-10-CM | POA: Diagnosis not present

## 2017-11-13 DIAGNOSIS — I5032 Chronic diastolic (congestive) heart failure: Secondary | ICD-10-CM | POA: Diagnosis not present

## 2017-11-13 DIAGNOSIS — R5381 Other malaise: Secondary | ICD-10-CM | POA: Diagnosis not present

## 2017-11-20 DIAGNOSIS — M7989 Other specified soft tissue disorders: Secondary | ICD-10-CM | POA: Diagnosis not present

## 2017-11-20 DIAGNOSIS — R55 Syncope and collapse: Secondary | ICD-10-CM | POA: Diagnosis not present

## 2017-11-20 DIAGNOSIS — S0990XA Unspecified injury of head, initial encounter: Secondary | ICD-10-CM | POA: Diagnosis not present

## 2017-11-20 DIAGNOSIS — S60212A Contusion of left wrist, initial encounter: Secondary | ICD-10-CM | POA: Diagnosis not present

## 2017-11-20 DIAGNOSIS — R58 Hemorrhage, not elsewhere classified: Secondary | ICD-10-CM | POA: Diagnosis not present

## 2017-11-20 DIAGNOSIS — W19XXXA Unspecified fall, initial encounter: Secondary | ICD-10-CM | POA: Diagnosis not present

## 2017-11-20 DIAGNOSIS — Z9229 Personal history of other drug therapy: Secondary | ICD-10-CM | POA: Diagnosis not present

## 2017-11-20 DIAGNOSIS — Z743 Need for continuous supervision: Secondary | ICD-10-CM | POA: Diagnosis not present

## 2017-11-20 DIAGNOSIS — S199XXA Unspecified injury of neck, initial encounter: Secondary | ICD-10-CM | POA: Diagnosis not present

## 2017-11-20 DIAGNOSIS — Z7901 Long term (current) use of anticoagulants: Secondary | ICD-10-CM | POA: Diagnosis not present

## 2017-11-20 DIAGNOSIS — S6992XA Unspecified injury of left wrist, hand and finger(s), initial encounter: Secondary | ICD-10-CM | POA: Diagnosis not present

## 2017-11-20 DIAGNOSIS — R279 Unspecified lack of coordination: Secondary | ICD-10-CM | POA: Diagnosis not present

## 2017-11-20 DIAGNOSIS — M545 Low back pain: Secondary | ICD-10-CM | POA: Diagnosis not present

## 2017-11-20 DIAGNOSIS — M546 Pain in thoracic spine: Secondary | ICD-10-CM | POA: Diagnosis not present

## 2017-11-20 DIAGNOSIS — S1191XA Laceration without foreign body of unspecified part of neck, initial encounter: Secondary | ICD-10-CM | POA: Diagnosis not present

## 2017-11-21 DIAGNOSIS — M79632 Pain in left forearm: Secondary | ICD-10-CM | POA: Diagnosis not present

## 2017-11-21 IMAGING — CT CT HEAD W/O CM
3 of 4 series · 14 of 47 positions shown, 16 images · non-contrast
Comparison: Brain MRI 06/16/2010

CLINICAL DATA: Altered mental status.

EXAM:
CT HEAD WITHOUT CONTRAST
TECHNIQUE: Contiguous axial images were obtained from the base of the skull
through the vertex without intravenous contrast.

[Series 2: head w/o · axial · non-contrast · 0.45mm/px · z∈[-65,+65]mm · 8 of 32 slices shown, 10 images]
[im 3/32  brain]
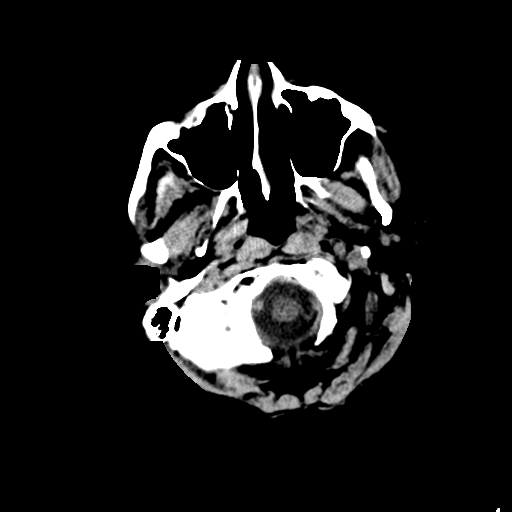
[im 3/32  bone]
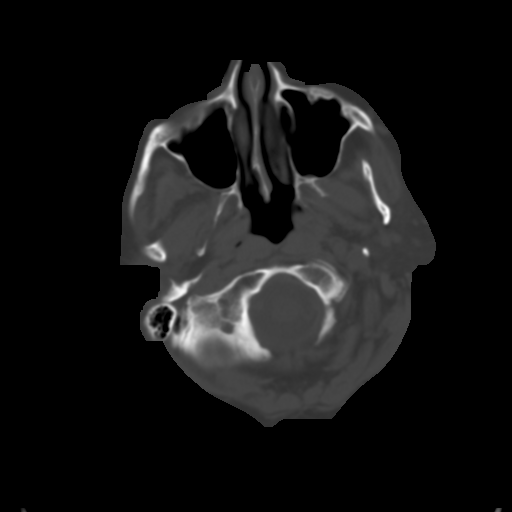
[im 7/32  brain]
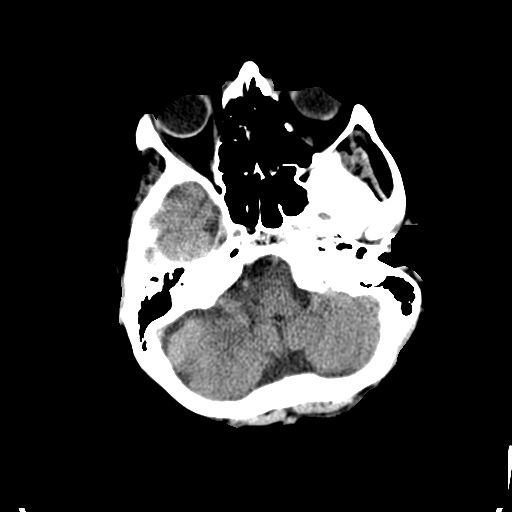
[im 12/32  brain]
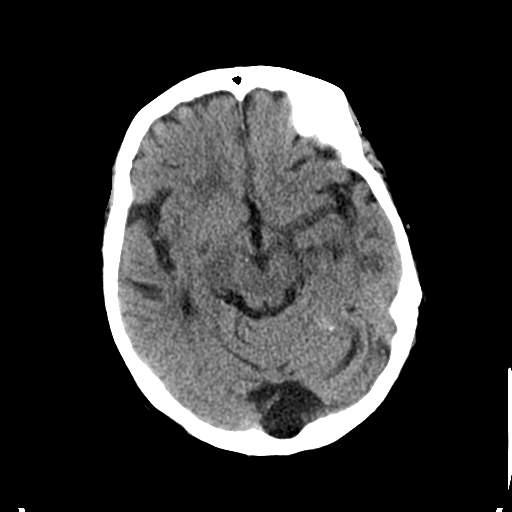
[im 14/32  brain]
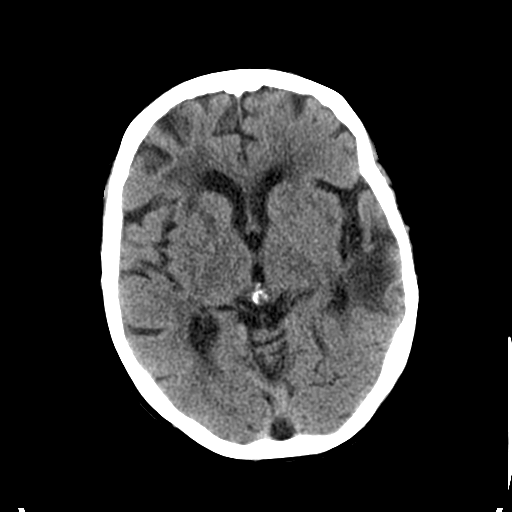
[im 18/32  brain]
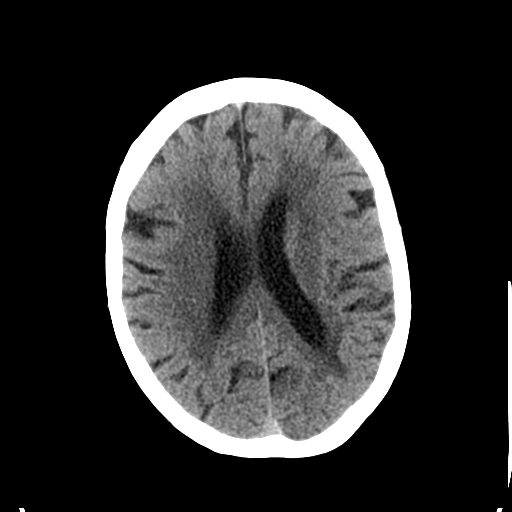
[im 18/32  bone]
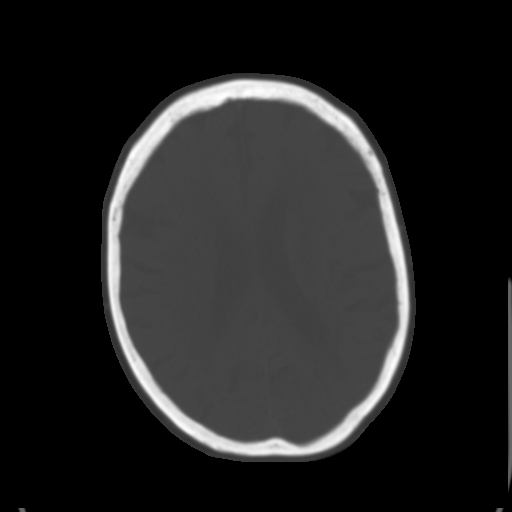
[im 20/32  brain]
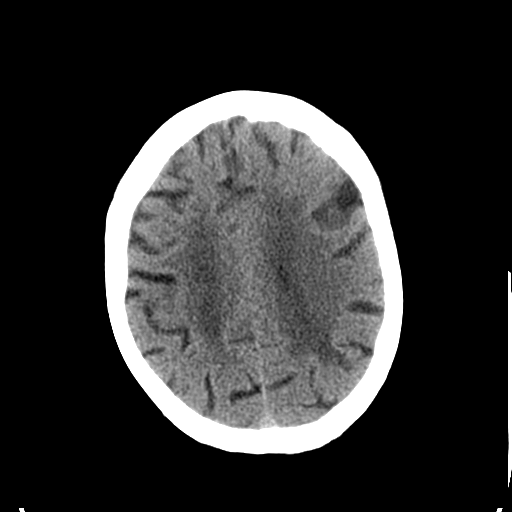
[im 25/32  brain]
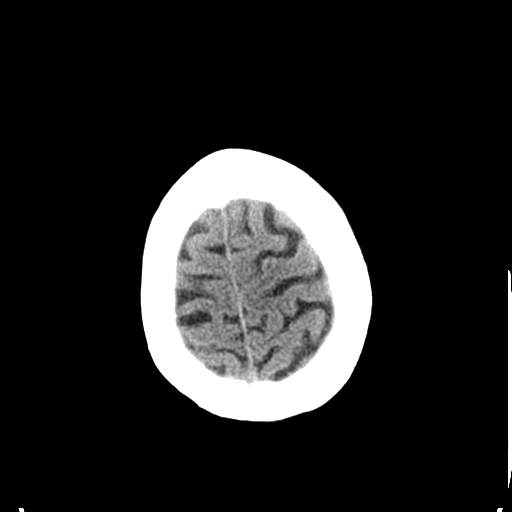
[im 29/32  brain]
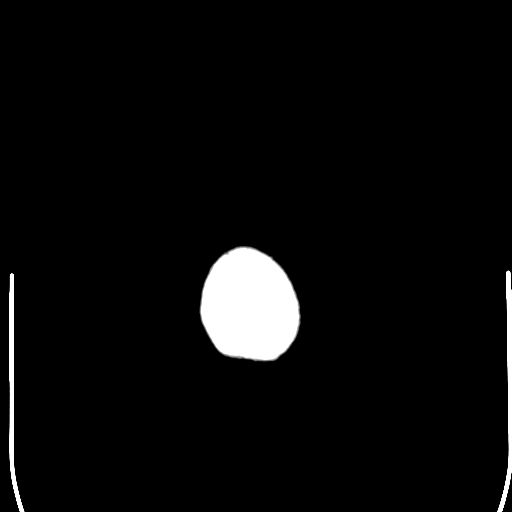

[Series 4: coronal · coronal · 0.30mm/px · 3 of 75 slices shown]
[im 25/75  brain]
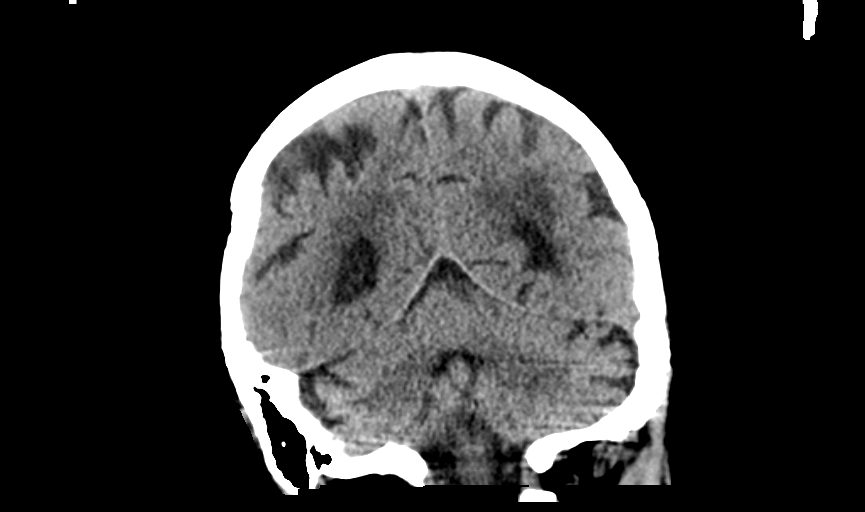
[im 33/75  brain]
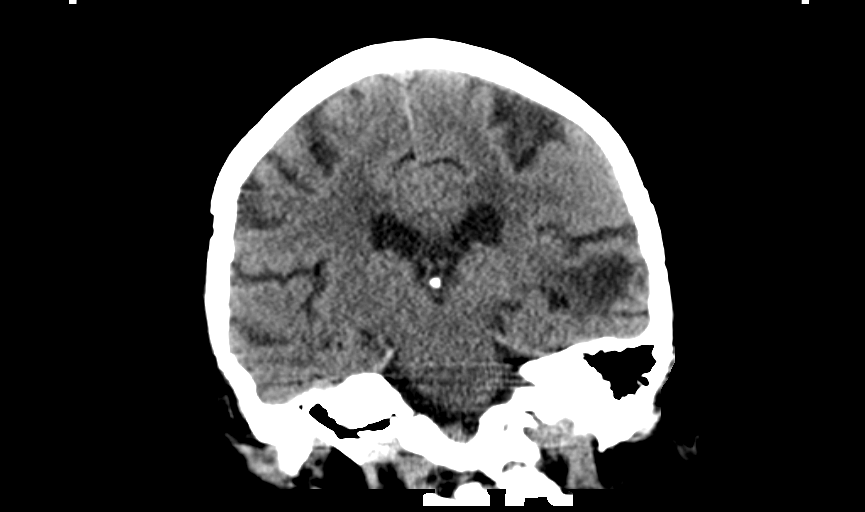
[im 42/75  brain]
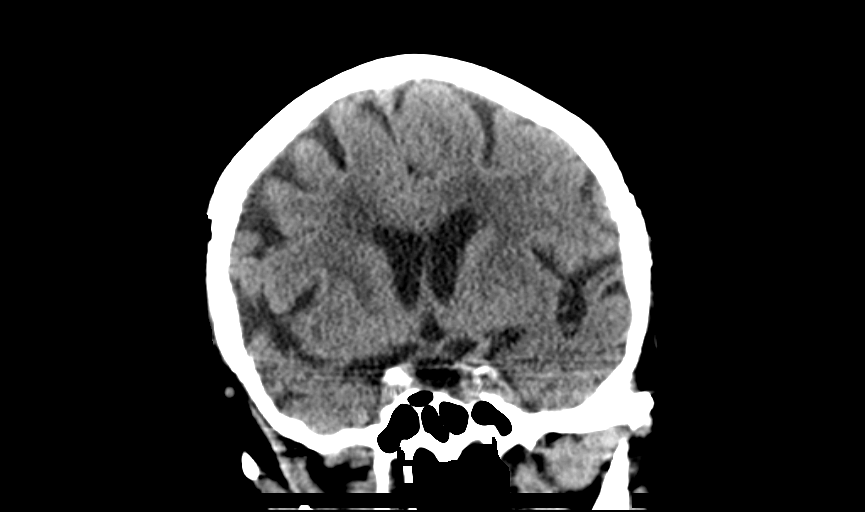

[Series 5: sagittal · sagittal · 0.30mm/px · 3 of 76 slices shown]
[im 29/76  brain]
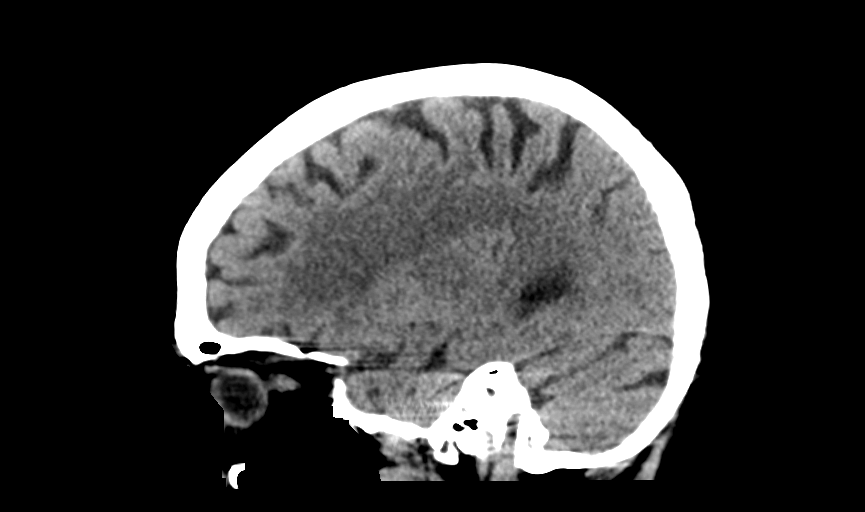
[im 38/76  brain]
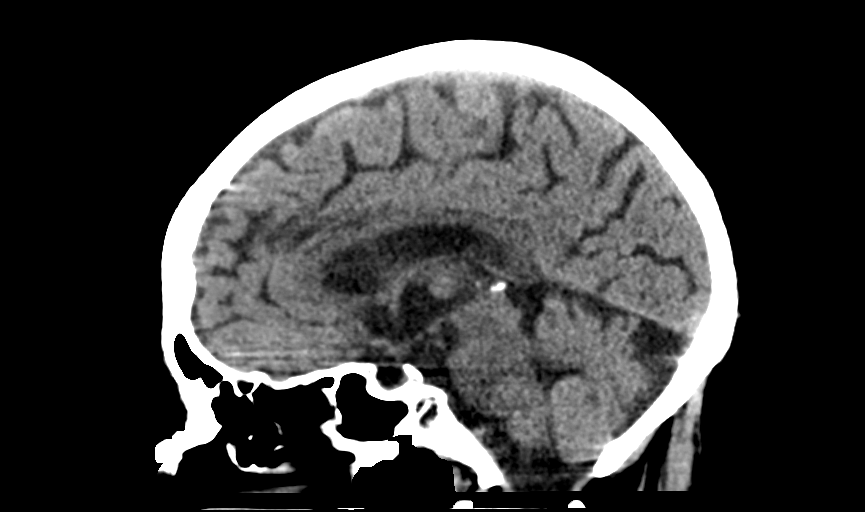
[im 47/76  brain]
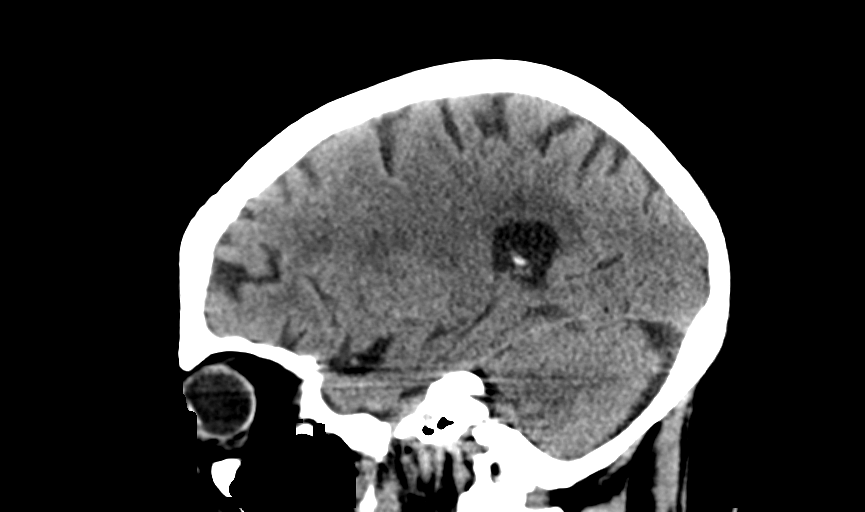

[14 of 47 positions shown; findings below may reference images not displayed]

FINDINGS: Brain: Remote infarction in the LEFT temporal lobe.

No acute intracranial hemorrhage. No focal mass lesion. No CT
evidence of acute infarction. No midline shift or mass effect. No
hydrocephalus. Basilar cisterns are patent.

There are periventricular and subcortical white matter
hypodensities. Generalized cortical atrophy.

Vascular: No hyperdense vessel or unexpected calcification.

Skull: Normal. Negative for fracture or focal lesion.

Sinuses/Orbits: Paranasal sinuses and mastoid air cells are clear.
Orbits are clear.

Other: None.
IMPRESSION: 1. No acute intracranial findings.
2. Remote LEFT temporal infarction.
3. Chronic atrophy and white matter microvascular disease.

## 2017-11-21 IMAGING — CR DG CHEST 2V
2 series · 2 of 2 positions shown · non-contrast
Comparison: Radiographs March 22, 2015.

CLINICAL DATA: Weakness.

EXAM:
CHEST  2 VIEW

[w chest lat]
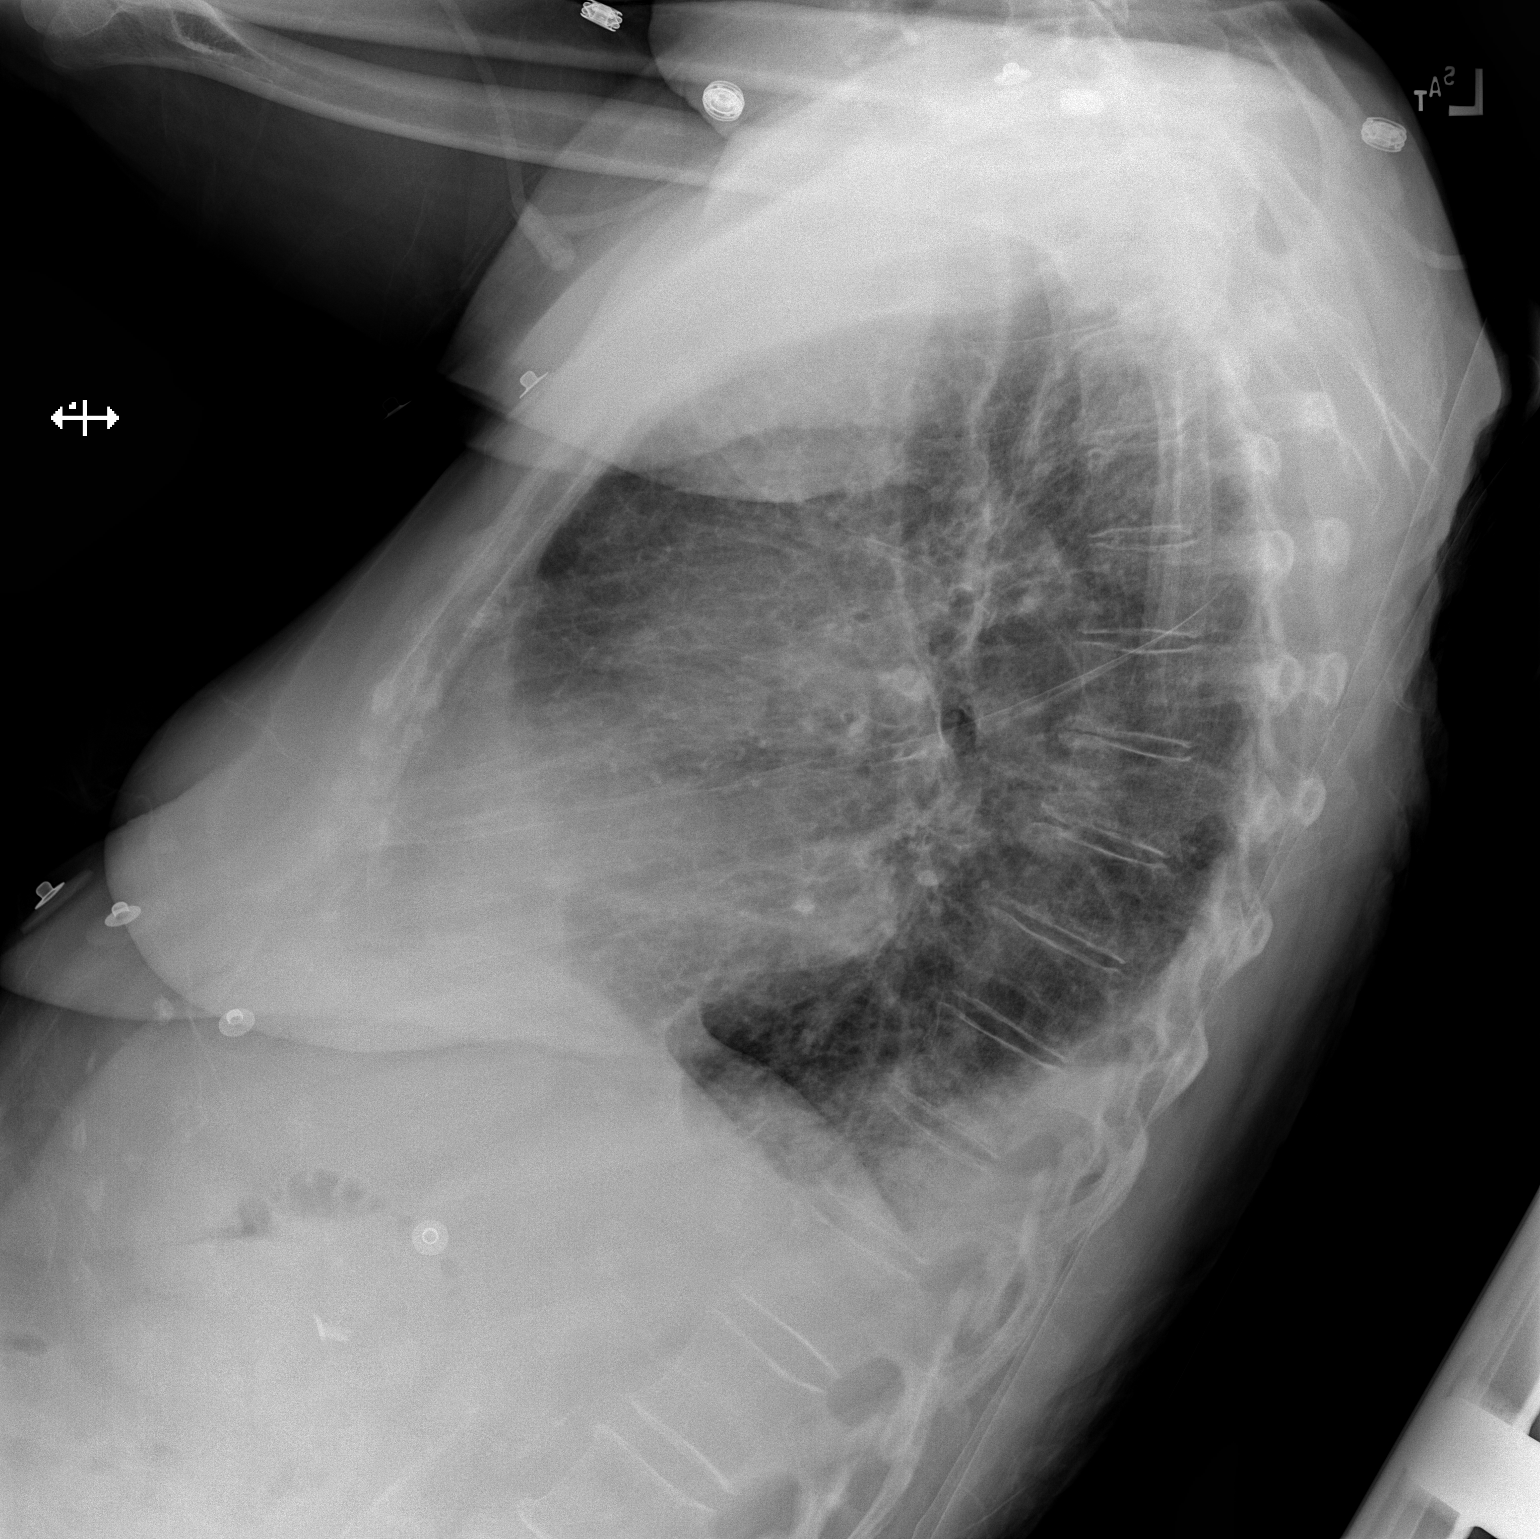

[x chest ap]
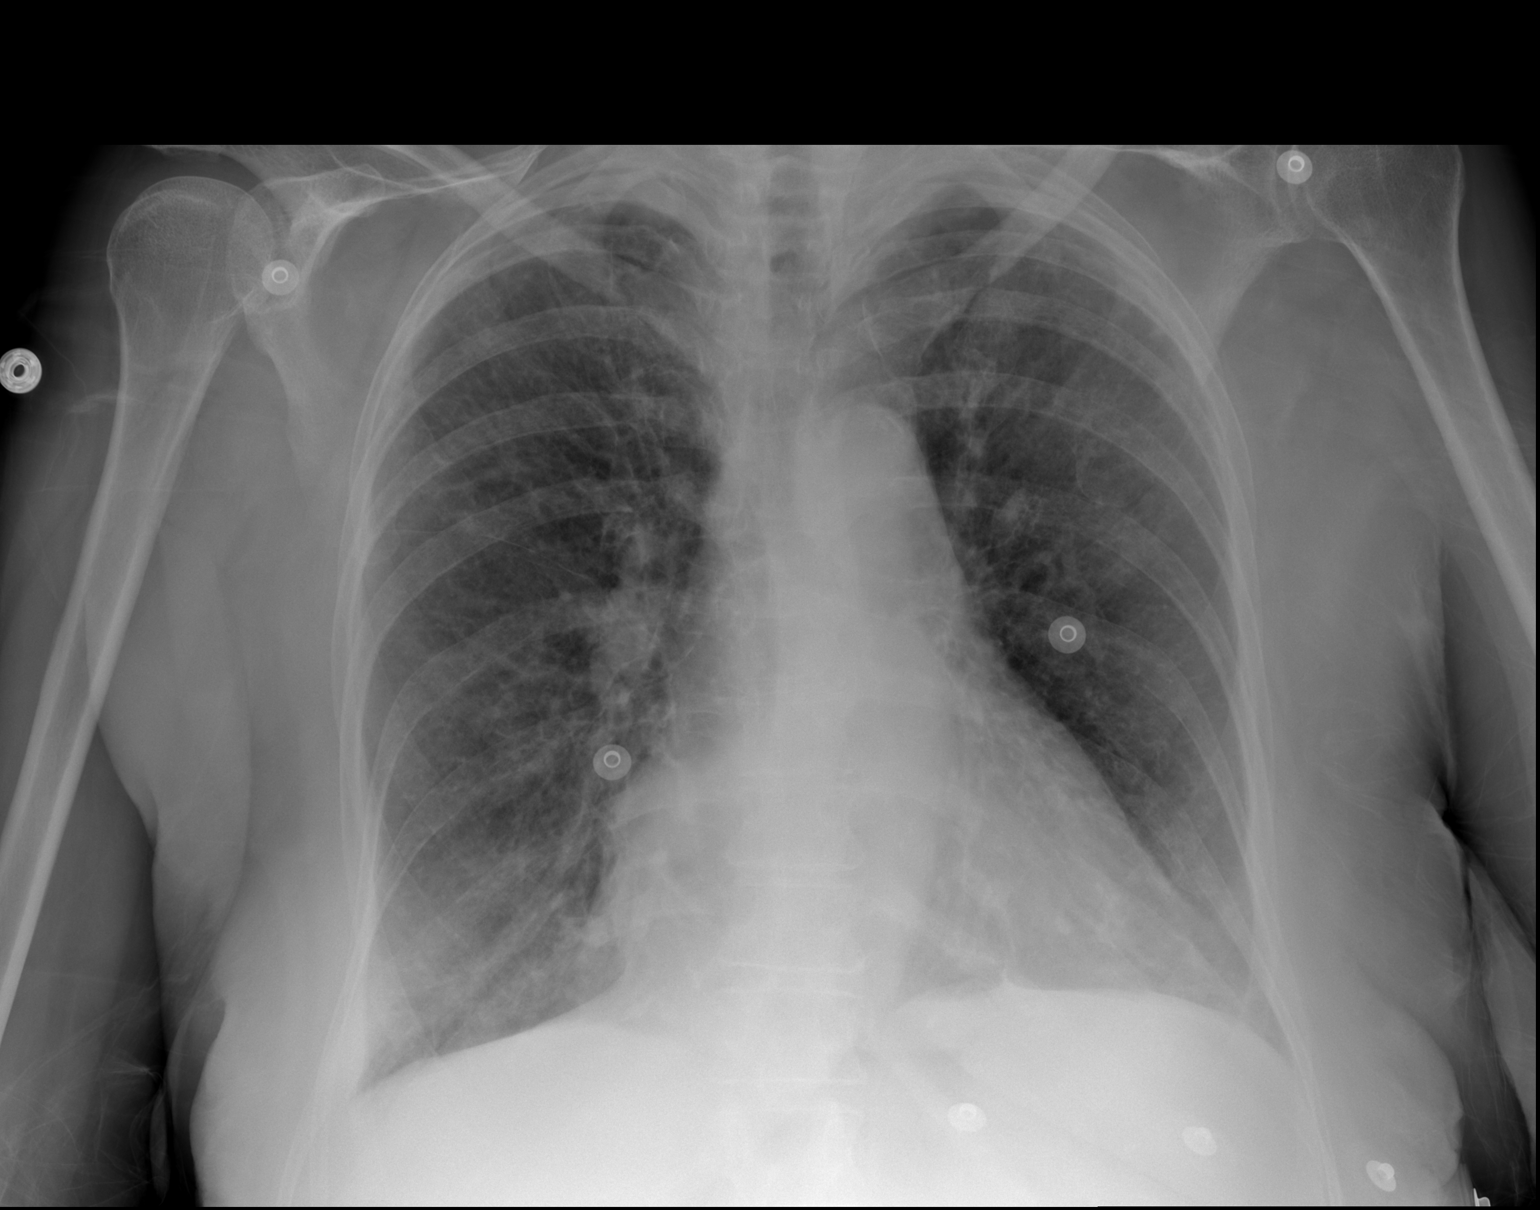

[2 of 2 positions shown; findings below may reference images not displayed]

FINDINGS: Stable cardiomediastinal silhouette. No pneumothorax is noted.
Minimal bilateral pleural effusions are noted. Mild bibasilar
subsegmental atelectasis is noted which is improved compared to
prior exam. Atherosclerosis of thoracic aorta is noted. Bony thorax
is unremarkable.
IMPRESSION: Probable mild bibasilar subsegmental atelectasis with minimal
pleural effusions.

## 2017-11-23 DIAGNOSIS — G301 Alzheimer's disease with late onset: Secondary | ICD-10-CM | POA: Diagnosis not present

## 2017-11-23 DIAGNOSIS — I5032 Chronic diastolic (congestive) heart failure: Secondary | ICD-10-CM | POA: Diagnosis not present

## 2017-11-23 DIAGNOSIS — R296 Repeated falls: Secondary | ICD-10-CM | POA: Diagnosis not present

## 2017-11-23 DIAGNOSIS — I482 Chronic atrial fibrillation: Secondary | ICD-10-CM | POA: Diagnosis not present

## 2017-11-30 DIAGNOSIS — R2681 Unsteadiness on feet: Secondary | ICD-10-CM | POA: Diagnosis not present

## 2017-11-30 DIAGNOSIS — I10 Essential (primary) hypertension: Secondary | ICD-10-CM | POA: Diagnosis not present

## 2017-11-30 DIAGNOSIS — J189 Pneumonia, unspecified organism: Secondary | ICD-10-CM | POA: Diagnosis not present

## 2017-11-30 DIAGNOSIS — I5032 Chronic diastolic (congestive) heart failure: Secondary | ICD-10-CM | POA: Diagnosis not present

## 2017-11-30 DIAGNOSIS — J156 Pneumonia due to other aerobic Gram-negative bacteria: Secondary | ICD-10-CM | POA: Diagnosis not present

## 2017-11-30 DIAGNOSIS — I482 Chronic atrial fibrillation: Secondary | ICD-10-CM | POA: Diagnosis not present

## 2017-12-14 DIAGNOSIS — R05 Cough: Secondary | ICD-10-CM | POA: Diagnosis not present

## 2017-12-14 DIAGNOSIS — G301 Alzheimer's disease with late onset: Secondary | ICD-10-CM | POA: Diagnosis not present

## 2017-12-14 DIAGNOSIS — I5032 Chronic diastolic (congestive) heart failure: Secondary | ICD-10-CM | POA: Diagnosis not present

## 2017-12-16 DIAGNOSIS — J811 Chronic pulmonary edema: Secondary | ICD-10-CM | POA: Diagnosis not present

## 2017-12-16 DIAGNOSIS — T17928A Food in respiratory tract, part unspecified causing other injury, initial encounter: Secondary | ICD-10-CM | POA: Diagnosis not present

## 2017-12-16 DIAGNOSIS — T17920A Food in respiratory tract, part unspecified causing asphyxiation, initial encounter: Secondary | ICD-10-CM | POA: Diagnosis not present

## 2017-12-16 DIAGNOSIS — R410 Disorientation, unspecified: Secondary | ICD-10-CM | POA: Diagnosis not present

## 2017-12-16 DIAGNOSIS — I959 Hypotension, unspecified: Secondary | ICD-10-CM | POA: Diagnosis not present

## 2017-12-16 DIAGNOSIS — R0989 Other specified symptoms and signs involving the circulatory and respiratory systems: Secondary | ICD-10-CM | POA: Diagnosis not present

## 2017-12-20 DIAGNOSIS — E038 Other specified hypothyroidism: Secondary | ICD-10-CM | POA: Diagnosis not present

## 2017-12-20 DIAGNOSIS — Z79899 Other long term (current) drug therapy: Secondary | ICD-10-CM | POA: Diagnosis not present

## 2017-12-20 DIAGNOSIS — E559 Vitamin D deficiency, unspecified: Secondary | ICD-10-CM | POA: Diagnosis not present

## 2017-12-20 DIAGNOSIS — E7849 Other hyperlipidemia: Secondary | ICD-10-CM | POA: Diagnosis not present

## 2017-12-20 DIAGNOSIS — D518 Other vitamin B12 deficiency anemias: Secondary | ICD-10-CM | POA: Diagnosis not present

## 2017-12-20 DIAGNOSIS — E119 Type 2 diabetes mellitus without complications: Secondary | ICD-10-CM | POA: Diagnosis not present

## 2017-12-21 DIAGNOSIS — R1319 Other dysphagia: Secondary | ICD-10-CM | POA: Diagnosis not present

## 2017-12-21 DIAGNOSIS — I5032 Chronic diastolic (congestive) heart failure: Secondary | ICD-10-CM | POA: Diagnosis not present

## 2017-12-21 DIAGNOSIS — R296 Repeated falls: Secondary | ICD-10-CM | POA: Diagnosis not present

## 2017-12-21 DIAGNOSIS — I482 Chronic atrial fibrillation: Secondary | ICD-10-CM | POA: Diagnosis not present

## 2017-12-28 DIAGNOSIS — I5032 Chronic diastolic (congestive) heart failure: Secondary | ICD-10-CM | POA: Diagnosis not present

## 2017-12-28 DIAGNOSIS — G301 Alzheimer's disease with late onset: Secondary | ICD-10-CM | POA: Diagnosis not present

## 2017-12-28 DIAGNOSIS — I10 Essential (primary) hypertension: Secondary | ICD-10-CM | POA: Diagnosis not present

## 2017-12-28 DIAGNOSIS — I482 Chronic atrial fibrillation: Secondary | ICD-10-CM | POA: Diagnosis not present

## 2017-12-31 DIAGNOSIS — R2681 Unsteadiness on feet: Secondary | ICD-10-CM | POA: Diagnosis not present

## 2017-12-31 DIAGNOSIS — B351 Tinea unguium: Secondary | ICD-10-CM | POA: Diagnosis not present

## 2017-12-31 DIAGNOSIS — J189 Pneumonia, unspecified organism: Secondary | ICD-10-CM | POA: Diagnosis not present

## 2017-12-31 DIAGNOSIS — L84 Corns and callosities: Secondary | ICD-10-CM | POA: Diagnosis not present

## 2018-01-02 DIAGNOSIS — E038 Other specified hypothyroidism: Secondary | ICD-10-CM | POA: Diagnosis not present

## 2018-01-02 DIAGNOSIS — E119 Type 2 diabetes mellitus without complications: Secondary | ICD-10-CM | POA: Diagnosis not present

## 2018-01-02 DIAGNOSIS — D518 Other vitamin B12 deficiency anemias: Secondary | ICD-10-CM | POA: Diagnosis not present

## 2018-01-25 DIAGNOSIS — G301 Alzheimer's disease with late onset: Secondary | ICD-10-CM | POA: Diagnosis not present

## 2018-01-25 DIAGNOSIS — I5032 Chronic diastolic (congestive) heart failure: Secondary | ICD-10-CM | POA: Diagnosis not present

## 2018-01-25 DIAGNOSIS — I10 Essential (primary) hypertension: Secondary | ICD-10-CM | POA: Diagnosis not present

## 2018-01-25 DIAGNOSIS — I482 Chronic atrial fibrillation: Secondary | ICD-10-CM | POA: Diagnosis not present

## 2018-01-31 DIAGNOSIS — J189 Pneumonia, unspecified organism: Secondary | ICD-10-CM | POA: Diagnosis not present

## 2018-01-31 DIAGNOSIS — R2681 Unsteadiness on feet: Secondary | ICD-10-CM | POA: Diagnosis not present

## 2018-03-01 DIAGNOSIS — S3992XA Unspecified injury of lower back, initial encounter: Secondary | ICD-10-CM | POA: Diagnosis not present

## 2018-03-01 DIAGNOSIS — S0181XA Laceration without foreign body of other part of head, initial encounter: Secondary | ICD-10-CM | POA: Diagnosis not present

## 2018-03-01 DIAGNOSIS — S79912A Unspecified injury of left hip, initial encounter: Secondary | ICD-10-CM | POA: Diagnosis not present

## 2018-03-01 DIAGNOSIS — R58 Hemorrhage, not elsewhere classified: Secondary | ICD-10-CM | POA: Diagnosis not present

## 2018-03-01 DIAGNOSIS — S0003XA Contusion of scalp, initial encounter: Secondary | ICD-10-CM | POA: Diagnosis not present

## 2018-03-01 DIAGNOSIS — S299XXA Unspecified injury of thorax, initial encounter: Secondary | ICD-10-CM | POA: Diagnosis not present

## 2018-03-01 DIAGNOSIS — W01198A Fall on same level from slipping, tripping and stumbling with subsequent striking against other object, initial encounter: Secondary | ICD-10-CM | POA: Diagnosis not present

## 2018-03-01 DIAGNOSIS — M542 Cervicalgia: Secondary | ICD-10-CM | POA: Diagnosis not present

## 2018-03-01 DIAGNOSIS — S8992XA Unspecified injury of left lower leg, initial encounter: Secondary | ICD-10-CM | POA: Diagnosis not present

## 2018-03-01 DIAGNOSIS — M549 Dorsalgia, unspecified: Secondary | ICD-10-CM | POA: Diagnosis not present

## 2018-03-01 DIAGNOSIS — S0083XA Contusion of other part of head, initial encounter: Secondary | ICD-10-CM | POA: Diagnosis not present

## 2018-03-01 DIAGNOSIS — S199XXA Unspecified injury of neck, initial encounter: Secondary | ICD-10-CM | POA: Diagnosis not present

## 2018-03-01 DIAGNOSIS — W19XXXA Unspecified fall, initial encounter: Secondary | ICD-10-CM | POA: Diagnosis not present

## 2018-03-01 DIAGNOSIS — M25562 Pain in left knee: Secondary | ICD-10-CM | POA: Diagnosis not present

## 2018-03-01 DIAGNOSIS — Z743 Need for continuous supervision: Secondary | ICD-10-CM | POA: Diagnosis not present

## 2018-03-01 DIAGNOSIS — R404 Transient alteration of awareness: Secondary | ICD-10-CM | POA: Diagnosis not present

## 2018-03-01 DIAGNOSIS — R0902 Hypoxemia: Secondary | ICD-10-CM | POA: Diagnosis not present

## 2018-03-01 DIAGNOSIS — M1712 Unilateral primary osteoarthritis, left knee: Secondary | ICD-10-CM | POA: Diagnosis not present

## 2018-03-01 DIAGNOSIS — Z7901 Long term (current) use of anticoagulants: Secondary | ICD-10-CM | POA: Diagnosis not present

## 2018-03-01 DIAGNOSIS — S0121XA Laceration without foreign body of nose, initial encounter: Secondary | ICD-10-CM | POA: Diagnosis not present

## 2018-03-01 DIAGNOSIS — R279 Unspecified lack of coordination: Secondary | ICD-10-CM | POA: Diagnosis not present

## 2018-03-01 DIAGNOSIS — Z23 Encounter for immunization: Secondary | ICD-10-CM | POA: Diagnosis not present

## 2018-03-02 DIAGNOSIS — R2681 Unsteadiness on feet: Secondary | ICD-10-CM | POA: Diagnosis not present

## 2018-03-02 DIAGNOSIS — J189 Pneumonia, unspecified organism: Secondary | ICD-10-CM | POA: Diagnosis not present

## 2018-03-19 DIAGNOSIS — R05 Cough: Secondary | ICD-10-CM | POA: Diagnosis not present

## 2018-04-02 DIAGNOSIS — R2681 Unsteadiness on feet: Secondary | ICD-10-CM | POA: Diagnosis not present

## 2018-04-02 DIAGNOSIS — J189 Pneumonia, unspecified organism: Secondary | ICD-10-CM | POA: Diagnosis not present

## 2018-05-02 DIAGNOSIS — J449 Chronic obstructive pulmonary disease, unspecified: Secondary | ICD-10-CM | POA: Diagnosis not present

## 2018-05-02 DIAGNOSIS — J189 Pneumonia, unspecified organism: Secondary | ICD-10-CM | POA: Diagnosis not present

## 2018-05-02 DIAGNOSIS — R2681 Unsteadiness on feet: Secondary | ICD-10-CM | POA: Diagnosis not present

## 2018-05-13 DIAGNOSIS — I739 Peripheral vascular disease, unspecified: Secondary | ICD-10-CM | POA: Diagnosis not present

## 2018-05-13 DIAGNOSIS — B351 Tinea unguium: Secondary | ICD-10-CM | POA: Diagnosis not present

## 2018-05-17 DIAGNOSIS — E038 Other specified hypothyroidism: Secondary | ICD-10-CM | POA: Diagnosis not present

## 2018-05-17 DIAGNOSIS — E119 Type 2 diabetes mellitus without complications: Secondary | ICD-10-CM | POA: Diagnosis not present

## 2018-05-17 DIAGNOSIS — D518 Other vitamin B12 deficiency anemias: Secondary | ICD-10-CM | POA: Diagnosis not present

## 2018-06-02 DIAGNOSIS — J189 Pneumonia, unspecified organism: Secondary | ICD-10-CM | POA: Diagnosis not present

## 2018-06-02 DIAGNOSIS — R2681 Unsteadiness on feet: Secondary | ICD-10-CM | POA: Diagnosis not present

## 2018-06-17 DIAGNOSIS — D518 Other vitamin B12 deficiency anemias: Secondary | ICD-10-CM | POA: Diagnosis not present

## 2018-06-17 DIAGNOSIS — E119 Type 2 diabetes mellitus without complications: Secondary | ICD-10-CM | POA: Diagnosis not present

## 2018-06-17 DIAGNOSIS — E038 Other specified hypothyroidism: Secondary | ICD-10-CM | POA: Diagnosis not present

## 2018-06-18 DIAGNOSIS — K219 Gastro-esophageal reflux disease without esophagitis: Secondary | ICD-10-CM | POA: Diagnosis not present

## 2018-06-18 DIAGNOSIS — I5032 Chronic diastolic (congestive) heart failure: Secondary | ICD-10-CM | POA: Diagnosis not present

## 2018-06-18 DIAGNOSIS — K5900 Constipation, unspecified: Secondary | ICD-10-CM | POA: Diagnosis not present

## 2018-06-18 DIAGNOSIS — E785 Hyperlipidemia, unspecified: Secondary | ICD-10-CM | POA: Diagnosis not present

## 2018-07-03 DIAGNOSIS — J189 Pneumonia, unspecified organism: Secondary | ICD-10-CM | POA: Diagnosis not present

## 2018-07-03 DIAGNOSIS — R2681 Unsteadiness on feet: Secondary | ICD-10-CM | POA: Diagnosis not present

## 2018-07-23 DIAGNOSIS — E785 Hyperlipidemia, unspecified: Secondary | ICD-10-CM | POA: Diagnosis not present

## 2018-07-23 DIAGNOSIS — I5032 Chronic diastolic (congestive) heart failure: Secondary | ICD-10-CM | POA: Diagnosis not present

## 2018-07-23 DIAGNOSIS — K219 Gastro-esophageal reflux disease without esophagitis: Secondary | ICD-10-CM | POA: Diagnosis not present

## 2018-07-27 DIAGNOSIS — D518 Other vitamin B12 deficiency anemias: Secondary | ICD-10-CM | POA: Diagnosis not present

## 2018-07-27 DIAGNOSIS — E038 Other specified hypothyroidism: Secondary | ICD-10-CM | POA: Diagnosis not present

## 2018-07-27 DIAGNOSIS — E119 Type 2 diabetes mellitus without complications: Secondary | ICD-10-CM | POA: Diagnosis not present

## 2018-08-01 DIAGNOSIS — I5032 Chronic diastolic (congestive) heart failure: Secondary | ICD-10-CM | POA: Diagnosis not present

## 2018-08-01 DIAGNOSIS — J189 Pneumonia, unspecified organism: Secondary | ICD-10-CM | POA: Diagnosis not present

## 2018-08-01 DIAGNOSIS — R092 Respiratory arrest: Secondary | ICD-10-CM | POA: Diagnosis not present

## 2018-08-01 DIAGNOSIS — M6281 Muscle weakness (generalized): Secondary | ICD-10-CM | POA: Diagnosis not present

## 2018-08-12 DIAGNOSIS — M79675 Pain in left toe(s): Secondary | ICD-10-CM | POA: Diagnosis not present

## 2018-08-12 DIAGNOSIS — I739 Peripheral vascular disease, unspecified: Secondary | ICD-10-CM | POA: Diagnosis not present

## 2018-08-12 DIAGNOSIS — B351 Tinea unguium: Secondary | ICD-10-CM | POA: Diagnosis not present

## 2018-08-27 DIAGNOSIS — I4891 Unspecified atrial fibrillation: Secondary | ICD-10-CM | POA: Diagnosis not present

## 2018-08-27 DIAGNOSIS — E785 Hyperlipidemia, unspecified: Secondary | ICD-10-CM | POA: Diagnosis not present

## 2018-08-27 DIAGNOSIS — E038 Other specified hypothyroidism: Secondary | ICD-10-CM | POA: Diagnosis not present

## 2018-08-27 DIAGNOSIS — I5032 Chronic diastolic (congestive) heart failure: Secondary | ICD-10-CM | POA: Diagnosis not present

## 2018-08-27 DIAGNOSIS — E119 Type 2 diabetes mellitus without complications: Secondary | ICD-10-CM | POA: Diagnosis not present

## 2018-08-27 DIAGNOSIS — K219 Gastro-esophageal reflux disease without esophagitis: Secondary | ICD-10-CM | POA: Diagnosis not present

## 2018-08-27 DIAGNOSIS — D518 Other vitamin B12 deficiency anemias: Secondary | ICD-10-CM | POA: Diagnosis not present

## 2018-09-01 DIAGNOSIS — M6281 Muscle weakness (generalized): Secondary | ICD-10-CM | POA: Diagnosis not present

## 2018-09-01 DIAGNOSIS — I5032 Chronic diastolic (congestive) heart failure: Secondary | ICD-10-CM | POA: Diagnosis not present

## 2018-09-01 DIAGNOSIS — R0902 Hypoxemia: Secondary | ICD-10-CM | POA: Diagnosis not present

## 2018-09-01 DIAGNOSIS — J189 Pneumonia, unspecified organism: Secondary | ICD-10-CM | POA: Diagnosis not present

## 2018-09-10 DIAGNOSIS — B850 Pediculosis due to Pediculus humanus capitis: Secondary | ICD-10-CM | POA: Diagnosis not present

## 2018-09-13 DIAGNOSIS — N390 Urinary tract infection, site not specified: Secondary | ICD-10-CM | POA: Diagnosis not present

## 2018-09-17 DIAGNOSIS — N390 Urinary tract infection, site not specified: Secondary | ICD-10-CM | POA: Diagnosis not present

## 2018-09-17 DIAGNOSIS — J189 Pneumonia, unspecified organism: Secondary | ICD-10-CM | POA: Diagnosis not present

## 2018-09-19 DIAGNOSIS — E038 Other specified hypothyroidism: Secondary | ICD-10-CM | POA: Diagnosis not present

## 2018-09-19 DIAGNOSIS — E119 Type 2 diabetes mellitus without complications: Secondary | ICD-10-CM | POA: Diagnosis not present

## 2018-09-19 DIAGNOSIS — Z79899 Other long term (current) drug therapy: Secondary | ICD-10-CM | POA: Diagnosis not present

## 2018-09-19 DIAGNOSIS — D518 Other vitamin B12 deficiency anemias: Secondary | ICD-10-CM | POA: Diagnosis not present

## 2018-09-19 DIAGNOSIS — E559 Vitamin D deficiency, unspecified: Secondary | ICD-10-CM | POA: Diagnosis not present

## 2018-09-19 DIAGNOSIS — E7849 Other hyperlipidemia: Secondary | ICD-10-CM | POA: Diagnosis not present

## 2018-09-24 DIAGNOSIS — K219 Gastro-esophageal reflux disease without esophagitis: Secondary | ICD-10-CM | POA: Diagnosis not present

## 2018-09-24 DIAGNOSIS — I5032 Chronic diastolic (congestive) heart failure: Secondary | ICD-10-CM | POA: Diagnosis not present

## 2018-09-24 DIAGNOSIS — I4891 Unspecified atrial fibrillation: Secondary | ICD-10-CM | POA: Diagnosis not present

## 2018-09-24 DIAGNOSIS — E785 Hyperlipidemia, unspecified: Secondary | ICD-10-CM | POA: Diagnosis not present

## 2018-09-24 DIAGNOSIS — D518 Other vitamin B12 deficiency anemias: Secondary | ICD-10-CM | POA: Diagnosis not present

## 2018-09-24 DIAGNOSIS — E119 Type 2 diabetes mellitus without complications: Secondary | ICD-10-CM | POA: Diagnosis not present

## 2018-09-24 DIAGNOSIS — E038 Other specified hypothyroidism: Secondary | ICD-10-CM | POA: Diagnosis not present

## 2018-10-01 DIAGNOSIS — M6281 Muscle weakness (generalized): Secondary | ICD-10-CM | POA: Diagnosis not present

## 2018-10-01 DIAGNOSIS — R0902 Hypoxemia: Secondary | ICD-10-CM | POA: Diagnosis not present

## 2018-10-01 DIAGNOSIS — J189 Pneumonia, unspecified organism: Secondary | ICD-10-CM | POA: Diagnosis not present

## 2018-10-01 DIAGNOSIS — I5032 Chronic diastolic (congestive) heart failure: Secondary | ICD-10-CM | POA: Diagnosis not present

## 2018-10-28 DIAGNOSIS — K219 Gastro-esophageal reflux disease without esophagitis: Secondary | ICD-10-CM | POA: Diagnosis not present

## 2018-10-28 DIAGNOSIS — I10 Essential (primary) hypertension: Secondary | ICD-10-CM | POA: Diagnosis not present

## 2018-10-28 DIAGNOSIS — I5032 Chronic diastolic (congestive) heart failure: Secondary | ICD-10-CM | POA: Diagnosis not present

## 2018-10-28 DIAGNOSIS — I4811 Longstanding persistent atrial fibrillation: Secondary | ICD-10-CM | POA: Diagnosis not present

## 2018-11-01 DIAGNOSIS — J189 Pneumonia, unspecified organism: Secondary | ICD-10-CM | POA: Diagnosis not present

## 2018-11-01 DIAGNOSIS — I5032 Chronic diastolic (congestive) heart failure: Secondary | ICD-10-CM | POA: Diagnosis not present

## 2018-11-01 DIAGNOSIS — M6281 Muscle weakness (generalized): Secondary | ICD-10-CM | POA: Diagnosis not present

## 2018-11-01 DIAGNOSIS — R0902 Hypoxemia: Secondary | ICD-10-CM | POA: Diagnosis not present

## 2018-12-01 DIAGNOSIS — R0902 Hypoxemia: Secondary | ICD-10-CM | POA: Diagnosis not present

## 2018-12-01 DIAGNOSIS — M6281 Muscle weakness (generalized): Secondary | ICD-10-CM | POA: Diagnosis not present

## 2018-12-01 DIAGNOSIS — J189 Pneumonia, unspecified organism: Secondary | ICD-10-CM | POA: Diagnosis not present

## 2018-12-01 DIAGNOSIS — I5032 Chronic diastolic (congestive) heart failure: Secondary | ICD-10-CM | POA: Diagnosis not present

## 2019-01-01 DIAGNOSIS — J189 Pneumonia, unspecified organism: Secondary | ICD-10-CM | POA: Diagnosis not present

## 2019-01-01 DIAGNOSIS — M6281 Muscle weakness (generalized): Secondary | ICD-10-CM | POA: Diagnosis not present

## 2019-01-01 DIAGNOSIS — I5032 Chronic diastolic (congestive) heart failure: Secondary | ICD-10-CM | POA: Diagnosis not present

## 2019-01-01 DIAGNOSIS — R0902 Hypoxemia: Secondary | ICD-10-CM | POA: Diagnosis not present

## 2019-01-28 DEATH — deceased
